# Patient Record
Sex: Male | Born: 1935 | Race: White | Hispanic: No | Marital: Married | Smoking: Former smoker
Health system: Southern US, Community
[De-identification: ages and names within clinical notes are randomized; demographics above are authoritative.]

## PROBLEM LIST (undated history)

## (undated) DIAGNOSIS — S069X9A Unspecified intracranial injury with loss of consciousness of unspecified duration, initial encounter: Secondary | ICD-10-CM

## (undated) DIAGNOSIS — I4819 Other persistent atrial fibrillation: Secondary | ICD-10-CM

## (undated) DIAGNOSIS — H409 Unspecified glaucoma: Secondary | ICD-10-CM

## (undated) DIAGNOSIS — I472 Ventricular tachycardia, unspecified: Secondary | ICD-10-CM

## (undated) DIAGNOSIS — Z950 Presence of cardiac pacemaker: Secondary | ICD-10-CM

## (undated) DIAGNOSIS — I10 Essential (primary) hypertension: Secondary | ICD-10-CM

## (undated) DIAGNOSIS — I509 Heart failure, unspecified: Secondary | ICD-10-CM

## (undated) DIAGNOSIS — N189 Chronic kidney disease, unspecified: Secondary | ICD-10-CM

## (undated) DIAGNOSIS — L723 Sebaceous cyst: Secondary | ICD-10-CM

## (undated) DIAGNOSIS — J449 Chronic obstructive pulmonary disease, unspecified: Secondary | ICD-10-CM

## (undated) DIAGNOSIS — IMO0001 Reserved for inherently not codable concepts without codable children: Secondary | ICD-10-CM

## (undated) DIAGNOSIS — Z9989 Dependence on other enabling machines and devices: Secondary | ICD-10-CM

## (undated) DIAGNOSIS — N289 Disorder of kidney and ureter, unspecified: Secondary | ICD-10-CM

## (undated) DIAGNOSIS — J45909 Unspecified asthma, uncomplicated: Secondary | ICD-10-CM

## (undated) DIAGNOSIS — G4733 Obstructive sleep apnea (adult) (pediatric): Secondary | ICD-10-CM

## (undated) DIAGNOSIS — I499 Cardiac arrhythmia, unspecified: Secondary | ICD-10-CM

## (undated) DIAGNOSIS — T7840XA Allergy, unspecified, initial encounter: Secondary | ICD-10-CM

## (undated) DIAGNOSIS — Z8619 Personal history of other infectious and parasitic diseases: Secondary | ICD-10-CM

## (undated) DIAGNOSIS — H9190 Unspecified hearing loss, unspecified ear: Secondary | ICD-10-CM

## (undated) DIAGNOSIS — R001 Bradycardia, unspecified: Secondary | ICD-10-CM

## (undated) HISTORY — PX: JOINT REPLACEMENT: SHX530

## (undated) HISTORY — DX: Unspecified intracranial injury with loss of consciousness of unspecified duration, initial encounter: S06.9X9A

## (undated) HISTORY — DX: Allergy, unspecified, initial encounter: T78.40XA

## (undated) HISTORY — PX: SHOULDER ACROMIOPLASTY: SHX6093

## (undated) HISTORY — DX: Unspecified hearing loss, unspecified ear: H91.90

## (undated) HISTORY — DX: Ventricular tachycardia: I47.2

## (undated) HISTORY — DX: Essential (primary) hypertension: I10

## (undated) HISTORY — PX: APPENDECTOMY: SHX54

## (undated) HISTORY — DX: Chronic obstructive pulmonary disease, unspecified: J44.9

## (undated) HISTORY — DX: Sebaceous cyst: L72.3

## (undated) HISTORY — PX: INSERT / REPLACE / REMOVE PACEMAKER: SUR710

## (undated) HISTORY — DX: Ventricular tachycardia, unspecified: I47.20

---

## 2003-10-14 HISTORY — PX: TOTAL HIP ARTHROPLASTY: SHX124

## 2005-01-11 DIAGNOSIS — S069XAA Unspecified intracranial injury with loss of consciousness status unknown, initial encounter: Secondary | ICD-10-CM

## 2005-01-11 DIAGNOSIS — S069X9A Unspecified intracranial injury with loss of consciousness of unspecified duration, initial encounter: Secondary | ICD-10-CM

## 2005-01-11 HISTORY — DX: Unspecified intracranial injury with loss of consciousness status unknown, initial encounter: S06.9XAA

## 2005-01-11 HISTORY — DX: Unspecified intracranial injury with loss of consciousness of unspecified duration, initial encounter: S06.9X9A

## 2005-01-26 ENCOUNTER — Emergency Department: Payer: Self-pay | Admitting: Emergency Medicine

## 2005-03-13 HISTORY — PX: CRANIOTOMY: SHX93

## 2005-03-25 ENCOUNTER — Emergency Department: Payer: Self-pay | Admitting: Emergency Medicine

## 2005-03-25 ENCOUNTER — Other Ambulatory Visit: Payer: Self-pay

## 2006-12-16 ENCOUNTER — Ambulatory Visit: Payer: Self-pay | Admitting: Gastroenterology

## 2007-09-07 ENCOUNTER — Ambulatory Visit: Payer: Self-pay | Admitting: Internal Medicine

## 2008-04-19 ENCOUNTER — Ambulatory Visit: Payer: Self-pay | Admitting: General Practice

## 2008-04-19 ENCOUNTER — Other Ambulatory Visit: Payer: Self-pay

## 2008-05-01 ENCOUNTER — Inpatient Hospital Stay: Payer: Self-pay | Admitting: General Practice

## 2008-10-27 ENCOUNTER — Ambulatory Visit: Payer: Self-pay | Admitting: General Practice

## 2008-11-03 ENCOUNTER — Inpatient Hospital Stay: Payer: Self-pay | Admitting: General Practice

## 2009-03-24 IMAGING — CR DG SHOULDER 1V*R*
1 series · 1 of 1 positions shown · non-contrast
Comparison: none

REASON FOR EXAM: postop (2 views please)
COMMENTS:   Bedside (portable):Y

PROCEDURE:     DXR - DXR SHOULDER RIGHT ONE VIEW  - May 01, 2008  [DATE]
RESULT:     The patient is status post RIGHT shoulder hemiarthroplasty.
Surgical drains and skin staples are present. There is no evidence of bone
or hardware complication.

[view not recorded]
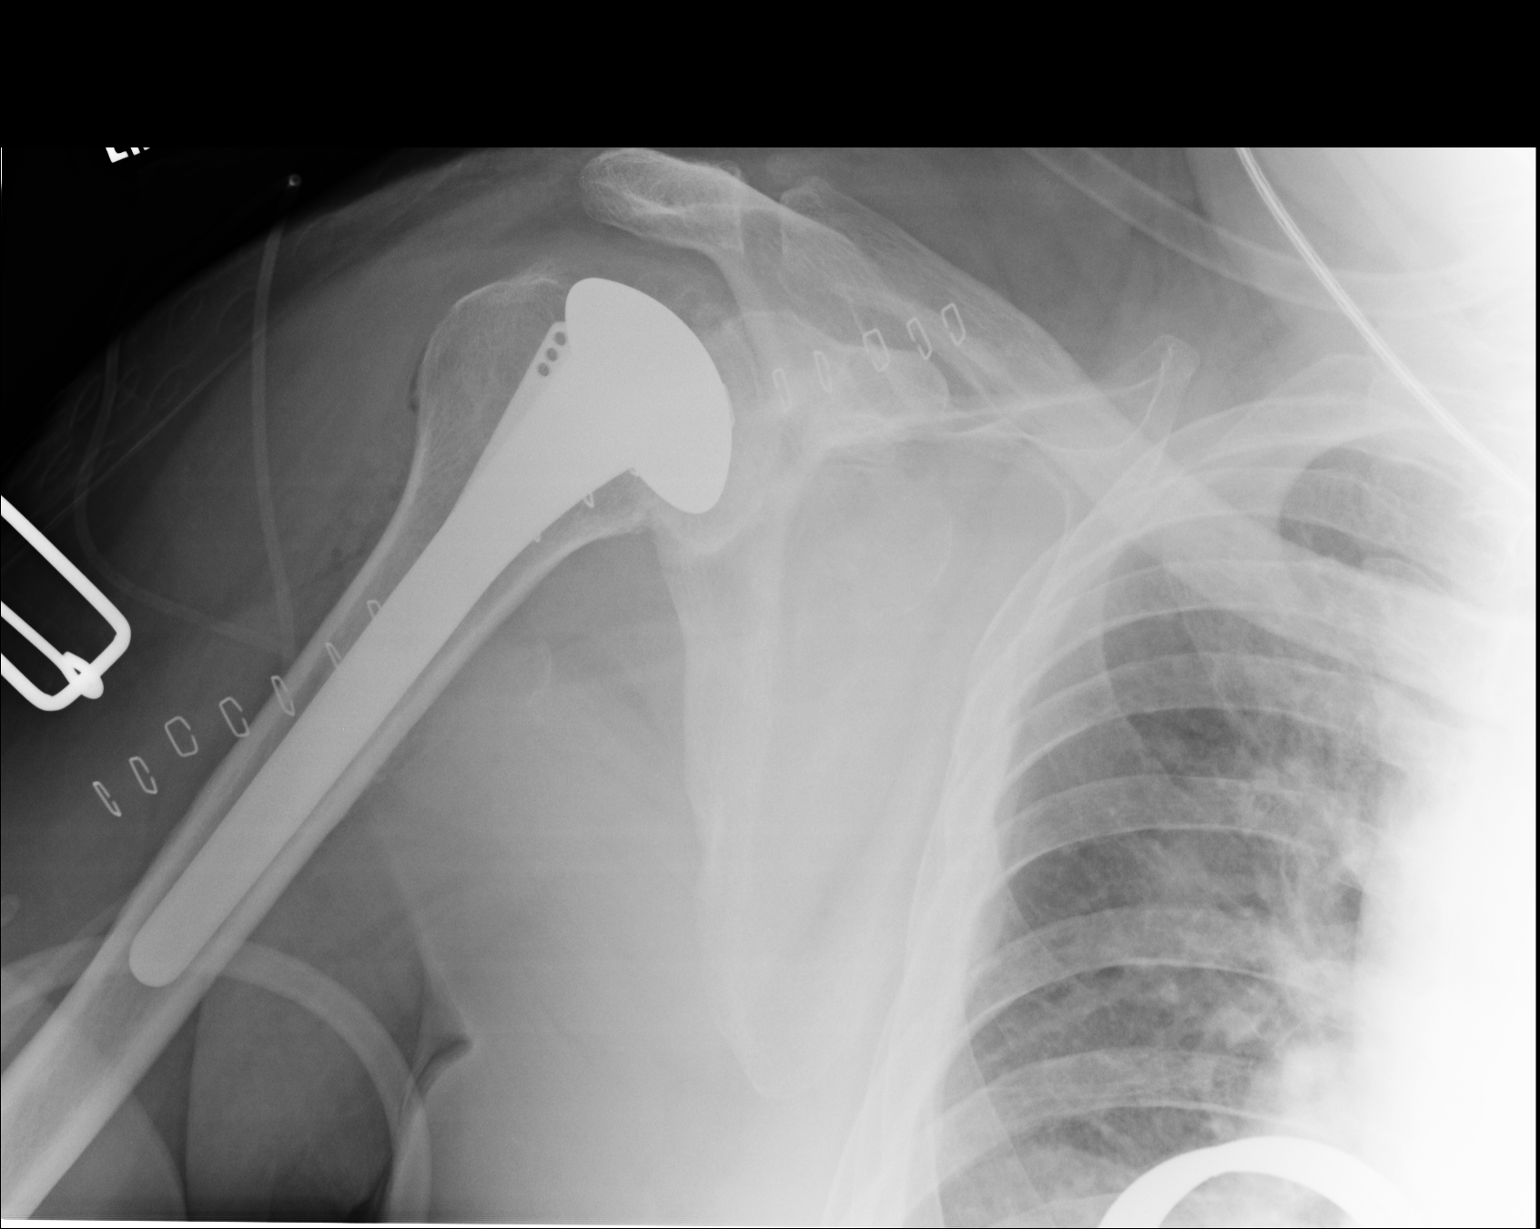

[1 of 1 positions shown; findings below may reference images not displayed]

IMPRESSION: Please see above.

## 2009-10-13 HISTORY — PX: PACEMAKER INSERTION: SHX728

## 2009-12-27 ENCOUNTER — Inpatient Hospital Stay: Payer: Self-pay | Admitting: Internal Medicine

## 2011-12-30 ENCOUNTER — Emergency Department: Payer: Self-pay | Admitting: Emergency Medicine

## 2011-12-30 LAB — TROPONIN I: Troponin-I: 0.02 ng/mL

## 2011-12-30 LAB — BASIC METABOLIC PANEL
Anion Gap: 12 (ref 7–16)
BUN: 25 mg/dL — ABNORMAL HIGH (ref 7–18)
Calcium, Total: 9.9 mg/dL (ref 8.5–10.1)
Co2: 25 mmol/L (ref 21–32)
EGFR (African American): >60
Glucose: 119 mg/dL — ABNORMAL HIGH (ref 65–99)
Osmolality: 287 (ref 275–301)
Sodium: 141 mmol/L (ref 136–145)

## 2011-12-30 LAB — CBC
HCT: 41.5 % (ref 40.0–52.0)
HGB: 14 g/dL (ref 13.0–18.0)
MCHC: 33.7 g/dL (ref 32.0–36.0)
RBC: 4.31 10*6/uL — ABNORMAL LOW (ref 4.40–5.90)
RDW: 13.2 % (ref 11.5–14.5)
WBC: 5.8 10*3/uL (ref 3.8–10.6)

## 2012-01-04 ENCOUNTER — Emergency Department: Payer: Self-pay | Admitting: Unknown Physician Specialty

## 2012-01-04 LAB — COMPREHENSIVE METABOLIC PANEL
Albumin: 3.5 g/dL (ref 3.4–5.0)
Anion Gap: 11 (ref 7–16)
BUN: 21 mg/dL — ABNORMAL HIGH (ref 7–18)
Bilirubin,Total: 0.5 mg/dL (ref 0.2–1.0)
Creatinine: 0.86 mg/dL (ref 0.60–1.30)
EGFR (African American): >60
Glucose: 95 mg/dL (ref 65–99)
SGPT (ALT): 35 U/L
Total Protein: 7.3 g/dL (ref 6.4–8.2)

## 2012-01-04 LAB — CBC
HCT: 41.2 % (ref 40.0–52.0)
HGB: 13.1 g/dL (ref 13.0–18.0)
MCH: 30.8 pg (ref 26.0–34.0)
Platelet: 230 10*3/uL (ref 150–440)
RDW: 12.9 % (ref 11.5–14.5)
WBC: 5.8 10*3/uL (ref 3.8–10.6)

## 2013-03-09 ENCOUNTER — Ambulatory Visit: Payer: Self-pay | Admitting: Ophthalmology

## 2013-03-27 ENCOUNTER — Emergency Department: Payer: Self-pay | Admitting: Emergency Medicine

## 2013-09-05 ENCOUNTER — Emergency Department: Payer: Self-pay | Admitting: Emergency Medicine

## 2013-09-05 LAB — BASIC METABOLIC PANEL
Anion Gap: 6 — ABNORMAL LOW (ref 7–16)
BUN: 20 mg/dL — ABNORMAL HIGH (ref 7–18)
Calcium, Total: 9.5 mg/dL (ref 8.5–10.1)
Chloride: 108 mmol/L — ABNORMAL HIGH (ref 98–107)
Co2: 24 mmol/L (ref 21–32)
Creatinine: 1.06 mg/dL (ref 0.60–1.30)
EGFR (African American): >60
EGFR (Non-African Amer.): >60
Glucose: 172 mg/dL — ABNORMAL HIGH (ref 65–99)
Osmolality: 282 (ref 275–301)
Potassium: 3.9 mmol/L (ref 3.5–5.1)
Sodium: 138 mmol/L (ref 136–145)

## 2013-09-05 LAB — CBC
HCT: 39.2 % — ABNORMAL LOW (ref 40.0–52.0)
HGB: 13 g/dL (ref 13.0–18.0)
MCH: 32.1 pg (ref 26.0–34.0)
MCHC: 33.3 g/dL (ref 32.0–36.0)
RBC: 4.06 10*6/uL — ABNORMAL LOW (ref 4.40–5.90)

## 2014-03-14 DIAGNOSIS — E782 Mixed hyperlipidemia: Secondary | ICD-10-CM | POA: Insufficient documentation

## 2014-03-14 DIAGNOSIS — I4819 Other persistent atrial fibrillation: Secondary | ICD-10-CM | POA: Insufficient documentation

## 2014-03-14 DIAGNOSIS — R001 Bradycardia, unspecified: Secondary | ICD-10-CM | POA: Insufficient documentation

## 2014-04-28 DIAGNOSIS — R079 Chest pain, unspecified: Secondary | ICD-10-CM | POA: Insufficient documentation

## 2014-08-28 DIAGNOSIS — G4733 Obstructive sleep apnea (adult) (pediatric): Secondary | ICD-10-CM | POA: Insufficient documentation

## 2014-08-28 DIAGNOSIS — I340 Nonrheumatic mitral (valve) insufficiency: Secondary | ICD-10-CM | POA: Insufficient documentation

## 2014-08-28 DIAGNOSIS — I5022 Chronic systolic (congestive) heart failure: Secondary | ICD-10-CM | POA: Insufficient documentation

## 2015-01-31 DIAGNOSIS — I10 Essential (primary) hypertension: Secondary | ICD-10-CM | POA: Insufficient documentation

## 2015-05-16 ENCOUNTER — Ambulatory Visit (INDEPENDENT_AMBULATORY_CARE_PROVIDER_SITE_OTHER): Payer: Medicare Other | Admitting: Surgery

## 2015-05-16 ENCOUNTER — Encounter: Payer: Self-pay | Admitting: Surgery

## 2015-05-16 VITALS — BP 135/78 | HR 84 | Temp 98.3°F | Ht 70.0 in | Wt 221.0 lb

## 2015-05-16 DIAGNOSIS — L723 Sebaceous cyst: Secondary | ICD-10-CM

## 2015-05-16 MED ORDER — CLINDAMYCIN HCL 300 MG PO CAPS: 600.0000 mg | ORAL_CAPSULE | Freq: Three times a day (TID) | ORAL | Status: DC

## 2015-05-16 NOTE — Progress Notes (Signed)
CC: Midback cyst  HPI: Douglas Washington is a pleasant 81 yo M who presents with recurrently enlarging mid back cyst.  Usually is small but has intermittent episodes where it gets larger, painful.  Has never drained.  Has never been on antibiotics.  No fever/chills, night sweats, shortness of breath, cough, chest pain, abdominal pain, nausea/vomiting, diarrhea/constipation, dysuria/hematuria.  Active Ambulatory Problems    Diagnosis Date Noted  . Sebaceous cyst 05/16/2015   Resolved Ambulatory Problems    Diagnosis Date Noted  . No Resolved Ambulatory Problems   Past Medical History  Diagnosis Date  . COPD (chronic obstructive pulmonary disease)   . Hypertension   . Atrial fibrillation   . Allergy   . Pacemaker   . Hearing loss   . Traumatic brain injury 01/2005   Past Surgical History  Procedure Laterality Date  . Craniotomy  03/2005  . Shoulder acromioplasty Bilateral   . Total hip arthroplasty Left 2005     Medication List       This list is accurate as of: 05/16/15 11:06 AM.  Always use your most recent med list.               albuterol 108 (90 BASE) MCG/ACT inhaler  Commonly known as:  PROVENTIL HFA;VENTOLIN HFA  Inhale 2 puffs into the lungs every 6 (six) hours as needed for wheezing or shortness of breath.     ALEVE PO  Take 2 capsules by mouth 2 (two) times daily.     aspirin 81 MG tablet  Take 81 mg by mouth daily.     brimonidine 0.2 % ophthalmic solution  Commonly known as:  ALPHAGAN  Place 1 drop into the left eye 2 (two) times daily.     cetirizine 10 MG tablet  Commonly known as:  ZYRTEC  Take 10 mg by mouth daily.     clindamycin 300 MG capsule  Commonly known as:  CLEOCIN  Take 2 capsules (600 mg total) by mouth 3 (three) times daily.     dorzolamide 2 % ophthalmic solution  Commonly known as:  TRUSOPT  Place 1 drop into both eyes 2 (two) times daily.     erythromycin ophthalmic ointment  Place 1 application into the left eye every other day.     glucosamine-chondroitin 500-400 MG tablet  Take 1 tablet by mouth daily.     latanoprost 0.005 % ophthalmic solution  Commonly known as:  XALATAN  Place 1 drop into both eyes at bedtime.     lisinopril 5 MG tablet  Commonly known as:  PRINIVIL,ZESTRIL  Take 5 mg by mouth daily.     loratadine 10 MG tablet  Commonly known as:  CLARITIN  Take 10 mg by mouth daily.     MEGARED OMEGA-3 KRILL OIL 500 MG Caps  Take 1 tablet by mouth daily.     montelukast 10 MG tablet  Commonly known as:  SINGULAIR  Take 10 mg by mouth at bedtime.     multivitamin with minerals tablet  Take 1 tablet by mouth daily.     OVER THE COUNTER MEDICATION  Take 2 capsules by mouth daily. Peenuts (for prostate health)     prednisoLONE acetate 1 % ophthalmic suspension  Commonly known as:  PRED FORTE  Place 1 drop into the left eye every 6 (six) hours.     PROBIOTIC MATURE ADULT PO  Take 1 capsule by mouth daily.     saw palmetto 500 MG capsule  Take 500  mg by mouth daily.     timolol 0.5 % ophthalmic solution  Commonly known as:  TIMOPTIC  Place 1 drop into the left eye 2 (two) times daily.     VALTREX 1000 MG tablet  Generic drug:  valACYclovir  Take 1,000 mg by mouth daily.       History   Social History  . Marital Status: Married    Spouse Name: N/A  . Number of Children: N/A  . Years of Education: N/A   Occupational History  . Not on file.   Social History Main Topics  . Smoking status: Former Smoker    Quit date: 04/14/1974  . Smokeless tobacco: Former Neurosurgeon    Quit date: 04/14/1974  . Alcohol Use: No  . Drug Use: No  . Sexual Activity: Not on file   Other Topics Concern  . Not on file   Social History Narrative   Family History  Problem Relation Age of Onset  . Diabetes Mother   . Heart disease Father   . Heart disease Brother    Allergies  Allergen Reactions  . Ciprofloxacin     Other reaction(s): Unknown  . Oxycodone Other (See Comments)    Hallucinations  .  Penicillins Swelling  . Simvastatin Other (See Comments)    Weakness  . Sulfa Antibiotics Swelling   Blood pressure 135/78, pulse 84, temperature 98.3 F (36.8 C), temperature source Oral, height  (1.778 m), weight 100.245 kg (221 lb). GEN: NAD/A&Ox3 FACE: no obvious facial trauma, normal external nose, normal external ears EYES: no scleral icterus, no conjunctivitis HEAD: normocephalic atraumatic CV: RRR, no MRG RESP: moving air well, lungs clear BACK: 3 x 3 cm nodule, nontender, some inferior erythema ABD: soft, nontender, nondistended EXT: moving all ext well, strength 5/5  Labs: No new labs Imaging: No new imaging  A/P 81 yo admit with infected sebaceous cyst.  Not draining, min tender.  Have offered excision but would like to treat with abx x 1 week to downsize cyst, decrease risk of wound dehiscence.  Have rx for clindamycin and will return in 1 week for excision.   NEURO: cnII-XII grossly intact, sensation intact all 4 ext

## 2015-05-16 NOTE — Patient Instructions (Signed)
You have been given a prescription for an antibiotic today. Please finish this course in it's entirety. Call me if there is any problems once you have started taking this medication.  We will then schedule you for a Procedure in the [place] office to remove this cyst.

## 2015-05-22 ENCOUNTER — Telehealth: Payer: Self-pay | Admitting: Surgery

## 2015-05-22 NOTE — Telephone Encounter (Signed)
Spoke with patient's wife at this time. She states that patient only has 6 pills left of antibiotic but that she can see puss under the surface of the skin. Denies drainage. Redness is not as bad and area has decreased in size but still feels taught. Explained that antibiotics were given to decrease redness and size of overall cyst so that excision would be easier but that antibiotics were not given to make the cyst drain.   Encouraged to continue with antibiotics until they are complete and we will see patient at 8/16 scheduled appointment.

## 2015-05-22 NOTE — Telephone Encounter (Signed)
Please call patients wife, Beorn Portman - patient has almost completed all of his antibiotics for sebaceous cyst. She is concerned it may be flaring up again and wants to know if he should have more antibiotics and/or should he come in to the office to have the cyst looked at before he comes in on 8/16 to have it removed with Dr Juliann Pulse. Please call and advise.

## 2015-05-29 ENCOUNTER — Encounter: Payer: Self-pay | Admitting: Surgery

## 2015-05-29 ENCOUNTER — Ambulatory Visit (INDEPENDENT_AMBULATORY_CARE_PROVIDER_SITE_OTHER): Payer: Medicare Other | Admitting: Surgery

## 2015-05-29 VITALS — BP 138/79 | HR 72 | Temp 98.2°F | Ht 70.0 in | Wt 223.0 lb

## 2015-05-29 DIAGNOSIS — L723 Sebaceous cyst: Secondary | ICD-10-CM | POA: Diagnosis not present

## 2015-05-29 DIAGNOSIS — L089 Local infection of the skin and subcutaneous tissue, unspecified: Secondary | ICD-10-CM

## 2015-05-29 MED ORDER — CLINDAMYCIN HCL 300 MG PO CAPS: 600.0000 mg | ORAL_CAPSULE | Freq: Three times a day (TID) | ORAL | Status: DC

## 2015-05-29 NOTE — Patient Instructions (Signed)
Call or return to ER if you develop fever greater than 101.5, nausea/vomiting, increased pain, redness/drainage from incisions Take bandages off in 48 hours.  Okay to shower with bandages on or after they come off, no tub baths

## 2015-05-29 NOTE — Progress Notes (Signed)
CC: Back pain, mass, redness  HPI: 81 yo with history of infected sebaceous cyst on antibiotics who presents for sebaceous cyst removal.  He has noted worsening pain, redness and drainage from nodule on back.  Has completed antibiotics.  No fevers/chills, night sweats shortness of breath, cough, chest pain, shortness of breath, cough, abdominal pain, nausea/vomiting, diarrhea/constipation, dysuria/hematuria.  Blood pressure 138/79, pulse 72, temperature 98.2 F (36.8 C), temperature source Oral, height 5\' 10"  (1.778 m), weight 223 lb (101.152 kg). GEN: NAD/A&Ox3 BACK: Approx 3 x 3 cm nodule with erythema/induration/fluctuance.  Multiple punctate drainage points.  Procedure performed:   Informed consent was obtained.  Back was prepped and draped.  Timeout was performed.  Lesion infiltrated with 1% lidocaine with epinephrine.  Lesion incised longitudinally.  Small ellipse of skin excised.  Loculations broken up with hemostat.  Wound packed with 1/4 inch iodoform gauze.  Dressed with sterile dressing.  No acute issues.  Needle, sponge and instrument count correct at end of procedure.    Patient restarted on 7 days of clindamycin.  To return in 2 days for packing removal.

## 2015-05-31 ENCOUNTER — Ambulatory Visit (INDEPENDENT_AMBULATORY_CARE_PROVIDER_SITE_OTHER): Payer: Medicare Other | Admitting: Surgery

## 2015-05-31 ENCOUNTER — Encounter: Payer: Self-pay | Admitting: Surgery

## 2015-05-31 VITALS — BP 126/82 | HR 74 | Temp 98.1°F | Ht 70.0 in | Wt 223.0 lb

## 2015-05-31 DIAGNOSIS — Z09 Encounter for follow-up examination after completed treatment for conditions other than malignant neoplasm: Secondary | ICD-10-CM

## 2015-05-31 NOTE — Patient Instructions (Signed)
We have not repacked your cyst today.   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  We will see you back on 06/11/15 at 3pm in the [place] office with Dr. Juliann Pulse per his request.

## 2015-05-31 NOTE — Progress Notes (Signed)
Surgery Progress Note  S: No acute issues O:Blood pressure 126/82, pulse 74, temperature 98.1 F (36.7 C), temperature source Oral, height 5\' 10"  (1.778 m), weight 223 lb (101.152 kg). GEN: NAD/A&Ox3 BACK: incision c/d/i,open, induration and erythema much improved  A/P 81 yo s/p I and D of infected sebaceous cyst, doing well - f/u in 1 week to eval for healing and for possible excision

## 2015-06-11 ENCOUNTER — Encounter: Payer: Self-pay | Admitting: Surgery

## 2015-06-11 ENCOUNTER — Ambulatory Visit (INDEPENDENT_AMBULATORY_CARE_PROVIDER_SITE_OTHER): Payer: Medicare Other | Admitting: Surgery

## 2015-06-11 VITALS — BP 156/90 | HR 86 | Temp 97.9°F | Ht 70.0 in | Wt 221.4 lb

## 2015-06-11 DIAGNOSIS — L723 Sebaceous cyst: Secondary | ICD-10-CM

## 2015-06-11 MED ORDER — CLINDAMYCIN HCL 300 MG PO CAPS: 600.0000 mg | ORAL_CAPSULE | Freq: Three times a day (TID) | ORAL | Status: DC

## 2015-06-11 NOTE — Patient Instructions (Signed)
Follow-up in 1 week in office with Dr. Juliann Pulse.  We have sent your Clindamycin to the Pharmacy. Please take this for three times daily until this is gone.

## 2015-06-11 NOTE — Progress Notes (Signed)
Surgery Office Note  S: Doing well.  Some purulent drainage from prior I and D.  No redness/swelling/pain Blood pressure 156/90, pulse 86, temperature 97.9 F (36.6 C), temperature source Oral, height 5\' 10"  (1.778 m), weight 221 lb 6.4 oz (100.426 kg). GEN: NAD/A&Ox3 BACK: Approx 2 x 2 cm lesion with incision from I and D with some purulence  A/P 81 yo s/p I and D of infected sebaceous cyst.  Would like it removed.  I have explained that although it is healing it should not be excised for risk of wound infection and dehiscence.  Will treat with 1 more course of abx to clean up infection then see in office next week for possible excision.

## 2015-06-20 ENCOUNTER — Encounter: Payer: Self-pay | Admitting: Surgery

## 2015-06-20 ENCOUNTER — Ambulatory Visit (INDEPENDENT_AMBULATORY_CARE_PROVIDER_SITE_OTHER): Payer: Medicare Other | Admitting: Surgery

## 2015-06-20 VITALS — BP 133/83 | HR 93 | Temp 97.9°F | Ht 70.0 in | Wt 221.0 lb

## 2015-06-20 DIAGNOSIS — L723 Sebaceous cyst: Secondary | ICD-10-CM

## 2015-06-20 NOTE — Patient Instructions (Addendum)
Call or return to ER if you develop fever greater than 101.5, nausea/vomiting, increased pain, redness/drainage from incisions Take bandages off in 48 hours.  Okay to shower with bandages on or after they come off, no tub baths. We will see you in two weeks to remove your sutures.

## 2015-06-20 NOTE — Progress Notes (Signed)
Surgery Clinic Note  S: Here to have sebaceous cyst removed. O:Blood pressure 133/83, pulse 93, temperature 97.9 F (36.6 C), temperature source Oral, height 5\' 10"  (1.778 m), weight 221 lb (100.245 kg). GEN: NAD/A&Ox3 BACK: Healed sebaceous cyst s/p I and D, approx 2 x 1.5 cm  Sebaceous cyst removed as follows:   Informed consent obtained.  Back prepped and draped.  Timeout performed.  Lengthwise small ellipse made, deepened to cyst.  Cyst excised circumferentially.  Irrigated, no bleeding noted.  Wound closed with interrupted 3-0 nylon vertical mattress sutures.  Dressing placed.  Drapes taken down.  No immediate complications.  Needle, sponge and instrument count correct at end of procedure.    Offered PO pain meds but patient refused.  F/u in 2 weeks.

## 2015-06-27 ENCOUNTER — Encounter (INDEPENDENT_AMBULATORY_CARE_PROVIDER_SITE_OTHER): Payer: Self-pay

## 2015-06-27 ENCOUNTER — Telehealth: Payer: Self-pay

## 2015-06-27 ENCOUNTER — Ambulatory Visit (INDEPENDENT_AMBULATORY_CARE_PROVIDER_SITE_OTHER): Payer: Medicare Other | Admitting: Surgery

## 2015-06-27 VITALS — BP 138/85 | HR 97 | Temp 97.5°F | Ht 70.0 in | Wt 224.0 lb

## 2015-06-27 DIAGNOSIS — L723 Sebaceous cyst: Secondary | ICD-10-CM

## 2015-06-27 NOTE — Patient Instructions (Signed)
Please give us a call if you have any further concerns. 

## 2015-06-27 NOTE — Telephone Encounter (Signed)
Patient was here for a sebaceous cyst removal on 06/20/2015 by Dr. Juliann Pulse.  Patient's wife called stating that last night her husband had bled from his incision area. I asked if his incision area was inflamed or red or if the stitches had come off, and she stated no. I also asked if he had a fever and she stated no. She stated that the incision area is a bit red but that it had stopped bleeding. However, his wife is worried and wanted a Careers adviser to take a look at her husband. I told her that it would be fine and to come in today at 3:15 PM so Dr. Michela Pitcher could take a look. Patient's wife agreed.

## 2015-06-27 NOTE — Progress Notes (Signed)
Outpatient Surgical Follow Up  06/27/2015  Douglas Washington is an 81 y.o. male.   Chief Complaint  Patient presents with  . Follow-up    Check Incision area    HPI: His wife noted some drainage from the sebaceous cyst site yesterday. It appeared to be bloody drainage. She was concerned that he had "popped" a stitch. She was also concerned about the bleeding and recommended he come to the office.  Past Medical History  Diagnosis Date  . Sebaceous cyst     Upper back  . COPD (chronic obstructive pulmonary disease)   . Hypertension   . Atrial fibrillation   . Allergy   . Pacemaker   . Hearing loss   . Traumatic brain injury 01/2005    Past Surgical History  Procedure Laterality Date  . Craniotomy  03/2005  . Shoulder acromioplasty Bilateral   . Total hip arthroplasty Left 2005    Family History  Problem Relation Age of Onset  . Diabetes Mother   . Heart disease Father   . Heart disease Brother     Social History:  reports that he quit smoking about 41 years ago. He quit smokeless tobacco use about 41 years ago. He reports that he does not drink alcohol or use illicit drugs.  Allergies:  Allergies  Allergen Reactions  . Ciprofloxacin     Other reaction(s): Unknown  . Oxycodone Other (See Comments)    Hallucinations  . Penicillins Swelling  . Simvastatin Other (See Comments)    Weakness  . Sulfa Antibiotics Swelling    Medications reviewed.    ROS    BP 138/85 mmHg  Pulse 97  Temp(Src) 97.5 F (36.4 C) (Oral)  Ht 5\' 10"  (1.778 m)  Wt 101.606 kg (224 lb)  BMI 32.14 kg/m2  Physical Exam There is no obvious bleeding site. I suspect he may have collected a small seroma or hematoma in the site of the excision. Currently does not appear to be any drainage. There is no evidence for infection the sutures all appeared intact.    No results found for this or any previous visit (from the past 48 hour(s)). No results found.  Assessment/Plan:  1. Sebaceous  cyst We reassured them today that there was no ongoing problem. We'll plan to see him back as scheduled for his office appointment and suture removal.     Tiney Rouge III  06/27/2015,negative

## 2015-07-03 ENCOUNTER — Encounter (INDEPENDENT_AMBULATORY_CARE_PROVIDER_SITE_OTHER): Payer: Self-pay

## 2015-07-03 ENCOUNTER — Ambulatory Visit (INDEPENDENT_AMBULATORY_CARE_PROVIDER_SITE_OTHER): Payer: Medicare Other | Admitting: Surgery

## 2015-07-03 ENCOUNTER — Encounter: Payer: Self-pay | Admitting: Surgery

## 2015-07-03 VITALS — BP 138/82 | HR 93 | Temp 97.8°F | Ht 70.0 in | Wt 225.0 lb

## 2015-07-03 DIAGNOSIS — Z09 Encounter for follow-up examination after completed treatment for conditions other than malignant neoplasm: Secondary | ICD-10-CM

## 2015-07-03 NOTE — Patient Instructions (Signed)
You will need a nurse visit in 1 week to remove remainder of your sutures.   Please call with any other questions or concerns.

## 2015-07-03 NOTE — Progress Notes (Signed)
Surgery Progress Note  S: Two episodes of drainage postop, 1 bloody, 1 serous.  Doing well.  No pain, min swelling O:Blood pressure 138/82, pulse 93, temperature 97.8 F (36.6 C), temperature source Oral, height 5\' 10"  (1.778 m), weight 225 lb (102.059 kg). GEN: NAD/A&Ox3 BACK: Healed incision with small bullae at inferior incision, no erythema/induration/drainage  A/P 81 yo s/p excision of epidermoid cyst, doing well - remove top two sutures - keep bottom two for now, f/u in 1 week for nurse visit and removal

## 2015-07-10 ENCOUNTER — Ambulatory Visit (INDEPENDENT_AMBULATORY_CARE_PROVIDER_SITE_OTHER): Payer: Medicare Other

## 2015-07-10 DIAGNOSIS — Z4802 Encounter for removal of sutures: Secondary | ICD-10-CM

## 2015-07-10 NOTE — Patient Instructions (Signed)
Please come back tomorrow 07/11/2015 to see one of our surgeons in the office. Maybe until then we will be able to remove your stitches.

## 2015-07-11 ENCOUNTER — Encounter: Payer: Self-pay | Admitting: General Surgery

## 2015-07-11 ENCOUNTER — Ambulatory Visit (INDEPENDENT_AMBULATORY_CARE_PROVIDER_SITE_OTHER): Payer: Medicare Other | Admitting: General Surgery

## 2015-07-11 VITALS — BP 118/81 | HR 91 | Temp 98.7°F | Ht 70.0 in | Wt 222.0 lb

## 2015-07-11 DIAGNOSIS — L723 Sebaceous cyst: Secondary | ICD-10-CM

## 2015-07-11 NOTE — Patient Instructions (Signed)
Please give [place] a call if you have any questions or concerns. Let us know if the incision area gets red or if he gets a fever higher than 101.5. 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

## 2015-07-11 NOTE — Progress Notes (Signed)
Patient feels and looks good. His incision area has some erythema but not warm at touch. However, there is some pus coming out. Therefore, I will not remove his sutures and scheduled an appointment to see Dr. Tonita Cong the next day (07/11/2015).

## 2015-07-11 NOTE — Progress Notes (Signed)
Outpatient Surgical Follow Up  07/11/2015  Douglas Washington is an 81 y.o. male.   Chief Complaint  Patient presents with  . Routine Post Op    Sebaceous Cyst Removal    HPI: Patient is returning to clinic for evaluation and suture removal. He has a report having a sebaceous cyst removed from his upper mid back approximately 10 days ago. He has completed a course of antibiotics as provided by the surgeon. He's had some straw-colored drainage from the incision site that has been gradually decreasing. Denies any fevers, chills, nausea, vomiting. Otherwise doing quite well. Wife is concerned areas still infected.  Past Medical History  Diagnosis Date  . Sebaceous cyst     Upper back  . COPD (chronic obstructive pulmonary disease)   . Hypertension   . Atrial fibrillation   . Allergy   . Pacemaker   . Hearing loss   . Traumatic brain injury 01/2005    Past Surgical History  Procedure Laterality Date  . Craniotomy  03/2005  . Shoulder acromioplasty Bilateral   . Total hip arthroplasty Left 2005    Family History  Problem Relation Age of Onset  . Diabetes Mother   . Heart disease Father   . Heart disease Brother     Social History:  reports that he quit smoking about 41 years ago. He quit smokeless tobacco use about 41 years ago. He reports that he does not drink alcohol or use illicit drugs.  Allergies:  Allergies  Allergen Reactions  . Ciprofloxacin     Other reaction(s): Unknown  . Oxycodone Other (See Comments)    Hallucinations  . Penicillins Swelling  . Simvastatin Other (See Comments)    Weakness  . Sulfa Antibiotics Swelling    Medications reviewed.    ROS multipoint review of systems was completed. All pertinent positives within the history of present illness remainder were negative.    BP 118/81 mmHg  Pulse 91  Temp(Src) 98.7 F (37.1 C) (Oral)  Ht 5\' 10"  (1.778 m)  Wt 100.699 kg (222 lb)  BMI 31.85 kg/m2  Physical Exam Gen.: No acute distress   Chest: Clear to auscultation and regular rhythm Abdomen: Soft, nontender, nondistended Skin: Sutures in place with approximate 2 cm prior cyst incision site. There are some hyperemia around this however no expressible drainage. No evidence of current induration or fluctuance.    No results found for this or any previous visit (from the past 48 hour(s)). No results found.  Assessment/Plan:  1. Sebaceous cyst 81 year old male 10 days status post excision of sebaceous cyst. Sutures removed in clinic today. Discussed with patient and his wife that should the drainage not stop by Monday they're to call clinic or should the redness around the wound spread incentive retreat there are return to clinic. Was follow-up when necessary.     Ricarda Frame  07/11/2015,

## 2015-07-12 ENCOUNTER — Ambulatory Visit: Payer: Medicare Other | Admitting: General Surgery

## 2016-04-08 ENCOUNTER — Encounter: Payer: Self-pay | Admitting: Surgery

## 2016-06-05 ENCOUNTER — Emergency Department (HOSPITAL_COMMUNITY): Payer: Medicare Other

## 2016-06-05 ENCOUNTER — Encounter (HOSPITAL_COMMUNITY): Payer: Self-pay

## 2016-06-05 ENCOUNTER — Inpatient Hospital Stay (HOSPITAL_COMMUNITY)
Admission: EM | Admit: 2016-06-05 | Discharge: 2016-06-09 | DRG: 280 | Disposition: A | Discharge status: Home / Self Care | Payer: Medicare Other | Attending: Cardiology | Admitting: Cardiology

## 2016-06-05 DIAGNOSIS — H9190 Unspecified hearing loss, unspecified ear: Secondary | ICD-10-CM | POA: Diagnosis present

## 2016-06-05 DIAGNOSIS — I481 Persistent atrial fibrillation: Secondary | ICD-10-CM | POA: Diagnosis present

## 2016-06-05 DIAGNOSIS — N132 Hydronephrosis with renal and ureteral calculous obstruction: Secondary | ICD-10-CM | POA: Diagnosis present

## 2016-06-05 DIAGNOSIS — Z950 Presence of cardiac pacemaker: Secondary | ICD-10-CM | POA: Diagnosis not present

## 2016-06-05 DIAGNOSIS — I428 Other cardiomyopathies: Secondary | ICD-10-CM | POA: Diagnosis present

## 2016-06-05 DIAGNOSIS — J449 Chronic obstructive pulmonary disease, unspecified: Secondary | ICD-10-CM | POA: Diagnosis present

## 2016-06-05 DIAGNOSIS — I493 Ventricular premature depolarization: Secondary | ICD-10-CM | POA: Diagnosis present

## 2016-06-05 DIAGNOSIS — G4733 Obstructive sleep apnea (adult) (pediatric): Secondary | ICD-10-CM | POA: Diagnosis present

## 2016-06-05 DIAGNOSIS — Z7982 Long term (current) use of aspirin: Secondary | ICD-10-CM | POA: Diagnosis not present

## 2016-06-05 DIAGNOSIS — R55 Syncope and collapse: Secondary | ICD-10-CM | POA: Diagnosis present

## 2016-06-05 DIAGNOSIS — Z79899 Other long term (current) drug therapy: Secondary | ICD-10-CM

## 2016-06-05 DIAGNOSIS — Z96642 Presence of left artificial hip joint: Secondary | ICD-10-CM | POA: Diagnosis present

## 2016-06-05 DIAGNOSIS — I459 Conduction disorder, unspecified: Secondary | ICD-10-CM | POA: Diagnosis not present

## 2016-06-05 DIAGNOSIS — D649 Anemia, unspecified: Secondary | ICD-10-CM | POA: Diagnosis present

## 2016-06-05 DIAGNOSIS — I509 Heart failure, unspecified: Secondary | ICD-10-CM | POA: Diagnosis not present

## 2016-06-05 DIAGNOSIS — I472 Ventricular tachycardia, unspecified: Secondary | ICD-10-CM

## 2016-06-05 DIAGNOSIS — I110 Hypertensive heart disease with heart failure: Secondary | ICD-10-CM | POA: Diagnosis present

## 2016-06-05 DIAGNOSIS — I5043 Acute on chronic combined systolic (congestive) and diastolic (congestive) heart failure: Secondary | ICD-10-CM | POA: Diagnosis not present

## 2016-06-05 DIAGNOSIS — Z87820 Personal history of traumatic brain injury: Secondary | ICD-10-CM

## 2016-06-05 DIAGNOSIS — Z87891 Personal history of nicotine dependence: Secondary | ICD-10-CM | POA: Diagnosis not present

## 2016-06-05 DIAGNOSIS — I4819 Other persistent atrial fibrillation: Secondary | ICD-10-CM

## 2016-06-05 DIAGNOSIS — I2510 Atherosclerotic heart disease of native coronary artery without angina pectoris: Secondary | ICD-10-CM | POA: Diagnosis present

## 2016-06-05 DIAGNOSIS — I214 Non-ST elevation (NSTEMI) myocardial infarction: Secondary | ICD-10-CM | POA: Diagnosis present

## 2016-06-05 DIAGNOSIS — I5022 Chronic systolic (congestive) heart failure: Secondary | ICD-10-CM | POA: Diagnosis present

## 2016-06-05 HISTORY — DX: Other persistent atrial fibrillation: I48.19

## 2016-06-05 HISTORY — DX: Dependence on other enabling machines and devices: Z99.89

## 2016-06-05 HISTORY — DX: Bradycardia, unspecified: R00.1

## 2016-06-05 HISTORY — DX: Obstructive sleep apnea (adult) (pediatric): G47.33

## 2016-06-05 LAB — CBC WITH DIFFERENTIAL/PLATELET
BASOS PCT: 0 %
Basophils Absolute: 0 10*3/uL (ref 0.0–0.1)
EOS PCT: 1 %
Eosinophils Absolute: 0.1 10*3/uL (ref 0.0–0.7)
HEMATOCRIT: 37.9 % — AB (ref 39.0–52.0)
HEMOGLOBIN: 11.5 g/dL — AB (ref 13.0–17.0)
LYMPHS PCT: 24 %
Lymphs Abs: 2 10*3/uL (ref 0.7–4.0)
MCH: 26.5 pg (ref 26.0–34.0)
MCHC: 30.3 g/dL (ref 30.0–36.0)
MCV: 87.3 fL (ref 78.0–100.0)
MONOS PCT: 8 %
Monocytes Absolute: 0.7 10*3/uL (ref 0.1–1.0)
NEUTROS PCT: 67 %
Neutro Abs: 5.7 10*3/uL (ref 1.7–7.7)
Platelets: 275 10*3/uL (ref 150–400)
RBC: 4.34 MIL/uL (ref 4.22–5.81)
RDW: 16.8 % — ABNORMAL HIGH (ref 11.5–15.5)
WBC: 8.5 10*3/uL (ref 4.0–10.5)

## 2016-06-05 LAB — BASIC METABOLIC PANEL
Anion gap: 7 (ref 5–15)
BUN: 24 mg/dL — AB (ref 6–20)
CHLORIDE: 114 mmol/L — AB (ref 101–111)
CO2: 17 mmol/L — AB (ref 22–32)
Calcium: 9.4 mg/dL (ref 8.9–10.3)
Creatinine, Ser: 1.24 mg/dL (ref 0.61–1.24)
GFR calc Af Amer: 59 mL/min — ABNORMAL LOW (ref >60)
GFR calc non Af Amer: 51 mL/min — ABNORMAL LOW (ref >60)
GLUCOSE: 184 mg/dL — AB (ref 65–99)
POTASSIUM: 4.5 mmol/L (ref 3.5–5.1)
SODIUM: 138 mmol/L (ref 135–145)

## 2016-06-05 LAB — I-STAT VENOUS BLOOD GAS, ED
Acid-base deficit: 10 mmol/L — ABNORMAL HIGH (ref 0.0–2.0)
BICARBONATE: 15.9 meq/L — AB (ref 20.0–24.0)
O2 Saturation: 94 %
PH VEN: 7.281 (ref 7.250–7.300)
TCO2: 17 mmol/L (ref 0–100)
pCO2, Ven: 33.7 mmHg — ABNORMAL LOW (ref 45.0–50.0)
pO2, Ven: 77 mmHg — ABNORMAL HIGH (ref 31.0–45.0)

## 2016-06-05 LAB — BRAIN NATRIURETIC PEPTIDE: B NATRIURETIC PEPTIDE 5: 120.5 pg/mL — AB (ref 0.0–100.0)

## 2016-06-05 LAB — PROTIME-INR
INR: 1.23
Prothrombin Time: 15.6 seconds — ABNORMAL HIGH (ref 11.4–15.2)

## 2016-06-05 LAB — I-STAT CHEM 8, ED
BUN: 25 mg/dL — AB (ref 6–20)
CALCIUM ION: 1.27 mmol/L — AB (ref 1.12–1.23)
CHLORIDE: 114 mmol/L — AB (ref 101–111)
Creatinine, Ser: 1.2 mg/dL (ref 0.61–1.24)
Glucose, Bld: 178 mg/dL — ABNORMAL HIGH (ref 65–99)
HCT: 38 % — ABNORMAL LOW (ref 39.0–52.0)
Hemoglobin: 12.9 g/dL — ABNORMAL LOW (ref 13.0–17.0)
Potassium: 4.5 mmol/L (ref 3.5–5.1)
SODIUM: 145 mmol/L (ref 135–145)
TCO2: 18 mmol/L (ref 0–100)

## 2016-06-05 LAB — POCT I-STAT TROPONIN I: Troponin i, poc: 0.05 ng/mL (ref 0.00–0.08)

## 2016-06-05 LAB — TSH: TSH: 1.339 u[IU]/mL (ref 0.350–4.500)

## 2016-06-05 LAB — MAGNESIUM
MAGNESIUM: 2.1 mg/dL (ref 1.7–2.4)
Magnesium: 2.1 mg/dL (ref 1.7–2.4)

## 2016-06-05 LAB — TROPONIN I: TROPONIN I: 4.05 ng/mL — AB (ref <0.03)

## 2016-06-05 MED ORDER — AMIODARONE HCL IN DEXTROSE 360-4.14 MG/200ML-% IV SOLN
60.0000 mg/h | INTRAVENOUS | Status: DC
  Administered 2016-06-05 (×2): 60 mg/h via INTRAVENOUS
  Filled 2016-06-05: qty 200

## 2016-06-05 MED ORDER — BECLOMETHASONE DIPROPIONATE 80 MCG/ACT IN AERS: 1.0000 | INHALATION_SPRAY | Freq: Two times a day (BID) | RESPIRATORY_TRACT | Status: DC

## 2016-06-05 MED ORDER — ERYTHROMYCIN 5 MG/GM OP OINT
1.0000 "application " | TOPICAL_OINTMENT | OPHTHALMIC | Status: DC
  Administered 2016-06-07: 1 via OPHTHALMIC
  Filled 2016-06-05 (×2): qty 3.5

## 2016-06-05 MED ORDER — AMIODARONE HCL IN DEXTROSE 360-4.14 MG/200ML-% IV SOLN
60.0000 mg/h | Freq: Once | INTRAVENOUS | Status: AC
  Administered 2016-06-05: 60 mg/h via INTRAVENOUS

## 2016-06-05 MED ORDER — ALPRAZOLAM 0.25 MG PO TABS: 0.2500 mg | ORAL_TABLET | Freq: Two times a day (BID) | ORAL | Status: DC | PRN

## 2016-06-05 MED ORDER — AMIODARONE HCL IN DEXTROSE 360-4.14 MG/200ML-% IV SOLN
30.0000 mg/h | INTRAVENOUS | Status: DC
Start: 2016-06-06 — End: 2016-06-05

## 2016-06-05 MED ORDER — ETOMIDATE 2 MG/ML IV SOLN
10.0000 mg | Freq: Once | INTRAVENOUS | Status: AC
  Administered 2016-06-05: 10 mg via INTRAVENOUS

## 2016-06-05 MED ORDER — MONTELUKAST SODIUM 10 MG PO TABS
10.0000 mg | ORAL_TABLET | Freq: Every day | ORAL | Status: DC
  Administered 2016-06-05 – 2016-06-08 (×4): 10 mg via ORAL
  Filled 2016-06-05 (×4): qty 1

## 2016-06-05 MED ORDER — AMIODARONE HCL IN DEXTROSE 360-4.14 MG/200ML-% IV SOLN
30.0000 mg/h | INTRAVENOUS | Status: DC
  Administered 2016-06-06: 30 mg/h via INTRAVENOUS
  Filled 2016-06-05: qty 200

## 2016-06-05 MED ORDER — BUDESONIDE 0.25 MG/2ML IN SUSP
0.2500 mg | Freq: Two times a day (BID) | RESPIRATORY_TRACT | Status: DC
  Administered 2016-06-06 – 2016-06-09 (×7): 0.25 mg via RESPIRATORY_TRACT
  Filled 2016-06-05 (×8): qty 2

## 2016-06-05 MED ORDER — PREDNISOLONE ACETATE 1 % OP SUSP
1.0000 [drp] | OPHTHALMIC | Status: DC
  Administered 2016-06-06: 1 [drp] via OPHTHALMIC
  Filled 2016-06-05: qty 1

## 2016-06-05 MED ORDER — ADULT MULTIVITAMIN W/MINERALS CH
1.0000 | ORAL_TABLET | Freq: Every day | ORAL | Status: DC
  Administered 2016-06-06 – 2016-06-09 (×4): 1 via ORAL
  Filled 2016-06-05 (×5): qty 1

## 2016-06-05 MED ORDER — LORATADINE 10 MG PO TABS
10.0000 mg | ORAL_TABLET | Freq: Every day | ORAL | Status: DC
  Administered 2016-06-06 – 2016-06-09 (×4): 10 mg via ORAL
  Filled 2016-06-05 (×5): qty 1

## 2016-06-05 MED ORDER — AMIODARONE HCL IN DEXTROSE 360-4.14 MG/200ML-% IV SOLN: 60.0000 mg/h | Freq: Once | INTRAVENOUS | Status: DC

## 2016-06-05 MED ORDER — SODIUM CHLORIDE 0.9% FLUSH: 3.0000 mL | INTRAVENOUS | Status: DC | PRN

## 2016-06-05 MED ORDER — AMIODARONE LOAD VIA INFUSION: 150.0000 mg | Freq: Once | INTRAVENOUS | Status: DC

## 2016-06-05 MED ORDER — TIMOLOL MALEATE 0.5 % OP SOLN
1.0000 [drp] | Freq: Two times a day (BID) | OPHTHALMIC | Status: DC
  Administered 2016-06-05 – 2016-06-08 (×6): 1 [drp] via OPHTHALMIC
  Filled 2016-06-05 (×2): qty 5

## 2016-06-05 MED ORDER — SODIUM CHLORIDE 0.9 % IV SOLN: 250.0000 mL | INTRAVENOUS | Status: DC | PRN

## 2016-06-05 MED ORDER — ALBUTEROL SULFATE (2.5 MG/3ML) 0.083% IN NEBU: 3.0000 mL | INHALATION_SOLUTION | Freq: Four times a day (QID) | RESPIRATORY_TRACT | Status: DC | PRN

## 2016-06-05 MED ORDER — ZOLPIDEM TARTRATE 5 MG PO TABS: 5.0000 mg | ORAL_TABLET | Freq: Every evening | ORAL | Status: DC | PRN

## 2016-06-05 MED ORDER — BRIMONIDINE TARTRATE 0.2 % OP SOLN
1.0000 [drp] | Freq: Two times a day (BID) | OPHTHALMIC | Status: DC
  Administered 2016-06-05 – 2016-06-08 (×6): 1 [drp] via OPHTHALMIC
  Filled 2016-06-05 (×2): qty 5

## 2016-06-05 MED ORDER — LATANOPROST 0.005 % OP SOLN
1.0000 [drp] | Freq: Every day | OPHTHALMIC | Status: DC
  Administered 2016-06-05 – 2016-06-08 (×3): 1 [drp] via OPHTHALMIC
  Filled 2016-06-05 (×2): qty 2.5

## 2016-06-05 MED ORDER — AMIODARONE HCL IN DEXTROSE 360-4.14 MG/200ML-% IV SOLN: 60.0000 mg/h | INTRAVENOUS | Status: DC

## 2016-06-05 MED ORDER — DORZOLAMIDE HCL 2 % OP SOLN
1.0000 [drp] | Freq: Two times a day (BID) | OPHTHALMIC | Status: DC
  Administered 2016-06-05 – 2016-06-08 (×6): 1 [drp] via OPHTHALMIC
  Filled 2016-06-05 (×2): qty 10

## 2016-06-05 MED ORDER — ALBUTEROL SULFATE HFA 108 (90 BASE) MCG/ACT IN AERS: 2.0000 | INHALATION_SPRAY | Freq: Four times a day (QID) | RESPIRATORY_TRACT | Status: DC | PRN

## 2016-06-05 MED ORDER — HEPARIN (PORCINE) IN NACL 100-0.45 UNIT/ML-% IJ SOLN
1200.0000 [IU]/h | INTRAMUSCULAR | Status: DC
  Administered 2016-06-05: 1300 [IU]/h via INTRAVENOUS
  Administered 2016-06-06: 1200 [IU]/h via INTRAVENOUS
  Filled 2016-06-05 (×2): qty 250

## 2016-06-05 MED ORDER — ONDANSETRON HCL 4 MG/2ML IJ SOLN: 4.0000 mg | Freq: Four times a day (QID) | INTRAMUSCULAR | Status: DC | PRN

## 2016-06-05 MED ORDER — NITROGLYCERIN 0.4 MG SL SUBL: 0.4000 mg | SUBLINGUAL_TABLET | SUBLINGUAL | Status: DC | PRN

## 2016-06-05 MED ORDER — SODIUM CHLORIDE 0.9% FLUSH
3.0000 mL | Freq: Two times a day (BID) | INTRAVENOUS | Status: DC
  Administered 2016-06-05 – 2016-06-09 (×5): 3 mL via INTRAVENOUS

## 2016-06-05 MED ORDER — HEPARIN BOLUS VIA INFUSION
4000.0000 [IU] | Freq: Once | INTRAVENOUS | Status: AC
  Administered 2016-06-05: 4000 [IU] via INTRAVENOUS
  Filled 2016-06-05: qty 4000

## 2016-06-05 MED ORDER — MEGARED OMEGA-3 KRILL OIL 500 MG PO CAPS: 500.0000 mg | ORAL_CAPSULE | Freq: Every day | ORAL | Status: DC

## 2016-06-05 MED ORDER — SODIUM CHLORIDE 0.9 % IV BOLUS (SEPSIS)
1000.0000 mL | Freq: Once | INTRAVENOUS | Status: AC
Start: 2016-06-05 — End: 2016-06-05
  Administered 2016-06-05: 1000 mL via INTRAVENOUS

## 2016-06-05 MED ORDER — GLUCOSAMINE-CHONDROITIN 500-400 MG PO TABS: 1.0000 | ORAL_TABLET | Freq: Every day | ORAL | Status: DC

## 2016-06-05 MED ORDER — VALACYCLOVIR HCL 500 MG PO TABS
500.0000 mg | ORAL_TABLET | Freq: Every day | ORAL | Status: DC
  Administered 2016-06-06 – 2016-06-09 (×4): 500 mg via ORAL
  Filled 2016-06-05 (×4): qty 1

## 2016-06-05 MED ORDER — ASPIRIN 81 MG PO CHEW
81.0000 mg | CHEWABLE_TABLET | Freq: Every day | ORAL | Status: DC
  Administered 2016-06-06 – 2016-06-07 (×2): 81 mg via ORAL
  Filled 2016-06-05 (×2): qty 1

## 2016-06-05 MED ORDER — ACETAMINOPHEN 325 MG PO TABS: 650.0000 mg | ORAL_TABLET | ORAL | Status: DC | PRN

## 2016-06-05 NOTE — H&P (Signed)
CARDIOLOGY HISTORY AND PHYSICAL   Patient ID: Douglas Washington MRN: [id number] DOB/AGE: 1928-12-05 82 y.o.  Admit date: 06/05/2016  Primary Physician   BABAOFF, Lavada Mesi, MD Primary Cardiologist   Dr Gwen Pounds Reason for Consultation   Syncope and arrhythmia Requesting MD: Dr Adela Lank  HPI:Douglas Washington is a 83 y.o. year old male with a history of NICM w/ EF 45%, PAF (refused anticoag), COPD, OSA on CPAP, HTN, prev SDH after trauma.  He may have had some extra congestion, but no edema. Chronic DOE, not much changed. No chest pain or palpitations. Doing well when seen by Dr Gwen Pounds.  Today, pt was in USOH and was standing still. He had been up around 5 minutes. He had a syncopal episode and woke up on the ground but could not get up. His wife heard him fall, but he was awake when she got to him.  He was sweating profusely, no chest pain, no palpitations, no SOB.  EMS was called and pt very tachycardic. IV NS 500 cc and Adenosine x 2 given without improvement en route to ER.   Upon arrival to the ER, his ECG shows a WCT with HR 227. He was unstable and hypotensive, and required DCCV x 1, successful. When in SR, he was having PVCs.    Past Medical History:  Diagnosis Date  . Allergy   . Atrial fibrillation (HCC)   . COPD (chronic obstructive pulmonary disease) (HCC)   . Hearing loss   . Hypertension   . Pacemaker   . Sebaceous cyst    Upper back  . Traumatic brain injury [facility]) 01/2005     Past Surgical History:  Procedure Laterality Date  . CRANIOTOMY  03/2005  . SHOULDER ACROMIOPLASTY Bilateral   . TOTAL HIP ARTHROPLASTY Left 2005    Allergies  Allergen Reactions  . Ciprofloxacin     Other reaction(s): Unknown  . Oxycodone Other (See Comments)    Hallucinations  . Penicillins Swelling  . Simvastatin Other (See Comments)    Weakness  . Sulfa Antibiotics Swelling    I have reviewed the patient's current medications Prior to Admission medications   Medication Sig Start  Date End Date Taking? Authorizing Provider  albuterol (PROVENTIL HFA;VENTOLIN HFA) 108 (90 BASE) MCG/ACT inhaler Inhale 2 puffs into the lungs every 6 (six) hours as needed for wheezing or shortness of breath.    Historical Provider, MD  aspirin 81 MG tablet Take 81 mg by mouth daily.    Historical Provider, MD  brimonidine (ALPHAGAN) 0.2 % ophthalmic solution Place 1 drop into the left eye 2 (two) times daily. 04/27/15   Historical Provider, MD  cetirizine (ZYRTEC) 10 MG tablet Take 10 mg by mouth daily.    Historical Provider, MD  dorzolamide (TRUSOPT) 2 % ophthalmic solution Place 1 drop into both eyes 2 (two) times daily. 04/30/15   Historical Provider, MD  erythromycin ophthalmic ointment Place 1 application into the left eye every other day. 04/03/15   Historical Provider, MD  fluticasone (FLONASE) 50 MCG/ACT nasal spray Place 2 sprays into both nostrils daily. 06/03/15   Historical Provider, MD  glucosamine-chondroitin 500-400 MG tablet Take 1 tablet by mouth daily.     Historical Provider, MD  latanoprost (XALATAN) 0.005 % ophthalmic solution Place 1 drop into both eyes at bedtime. 02/24/15   Historical Provider, MD  lisinopril (PRINIVIL,ZESTRIL) 5 MG tablet Take 5 mg by mouth daily.    Historical Provider, MD  loratadine (CLARITIN)  10 MG tablet Take 10 mg by mouth daily.    Historical Provider, MD  MEGARED OMEGA-3 KRILL OIL 500 MG CAPS Take 1 tablet by mouth daily.    Historical Provider, MD  montelukast (SINGULAIR) 10 MG tablet Take 10 mg by mouth at bedtime.    Historical Provider, MD  Multiple Vitamins-Minerals (MULTIVITAMIN WITH MINERALS) tablet Take 1 tablet by mouth daily.    Historical Provider, MD  Naproxen Sodium (ALEVE PO) Take 2 capsules by mouth 2 (two) times daily.    Historical Provider, MD  OVER THE COUNTER MEDICATION Take 2 capsules by mouth daily. Peenuts (for prostate health)    Historical Provider, MD  prednisoLONE acetate (PRED FORTE) 1 % ophthalmic suspension Place 1 drop  into the left eye every 6 (six) hours. 05/15/15   Historical Provider, MD  Probiotic Product (PROBIOTIC MATURE ADULT PO) Take 1 capsule by mouth daily.    Historical Provider, MD  saw palmetto 500 MG capsule Take 500 mg by mouth daily.    Historical Provider, MD  tamsulosin (FLOMAX) 0.4 MG CAPS capsule Take 1 capsule by mouth daily. 07/02/15 07/01/16  Historical Provider, MD  timolol (TIMOPTIC) 0.5 % ophthalmic solution Place 1 drop into the left eye 2 (two) times daily. 04/03/15   Historical Provider, MD  valACYclovir (VALTREX) 1000 MG tablet TAKE 1 TABLET(S) BY MOUTH ONCE A DAY FOR 30 DAYS 04/15/15   Historical Provider, MD     Social History   Social History  . Marital status: Married    Spouse name: N/A  . Number of children: N/A  . Years of education: N/A   Occupational History  . Retired    Social History Main Topics  . Smoking status: Former Smoker    Quit date: 04/14/1974  . Smokeless tobacco: Former NeurosurgeonUser    Quit date: 04/14/1974  . Alcohol use No  . Drug use: No  . Sexual activity: Not on file   Other Topics Concern  . Not on file   Social History Narrative  . No narrative on file    Family Status  Relation Status  . Mother   . Father   . Brother    Family History  Problem Relation Age of Onset  . Diabetes Mother   . Heart disease Father   . Heart disease Brother      ROS:  Full 14 point review of systems complete and found to be negative unless listed above.  Physical Exam: Blood pressure (!) 82/63, pulse 69, temperature 98.4 F (36.9 C), temperature source Oral, resp. rate 19, weight 222 lb (100.7 kg), SpO2 100 %.  General: Well developed, well nourished, male in no acute distress Head: Evidence of prior burr hole; normal eyelids Lungs: CTA Heart: HRRR S1 S2, no rub/gallop, Heart irregular rate and rhythm with S1, S2  murmur. pulses are 2+ all 4 extrem.   Neck: No carotid bruits. No lymphadenopathy.  JVD not elevated Abdomen: Bowel sounds present, abdomen soft  and non-tender without masses or hernias noted. Msk:  No spine or cva tenderness. No weakness, no joint deformities or effusions. Extremities: No clubbing or cyanosis. Trace edema.  Neuro: Alert and oriented X 3. No focal deficits noted. Psych:  Good affect, responds appropriately Skin: No rashes or lesions noted.  Labs:   Lab Results  Component Value Date   WBC 8.5 06/05/2016   HGB 11.5 (L) 06/05/2016   HCT 37.9 (L) 06/05/2016   MCV 87.3 06/05/2016   PLT 275 06/05/2016  No results for input(s): INR in the last 72 hours.   Recent Labs Lab 06/05/16 1730  NA 138  K 4.5  CL 114*  CO2 17*  BUN 24*  CREATININE 1.24  CALCIUM 9.4  GLUCOSE 184*   Magnesium  Date Value Ref Range Status  06/05/2016 2.1 1.7 - 2.4 mg/dL Final    Lipase  Date/Time Value Ref Range Status  01/04/2012 08:48 AM 164 73 - 393 Unit/L Final    ECG:  Sinus rhythm with ventricular pacing and PVCs.  Radiology:  Dg Chest Port 1 View  Result Date: 06/05/2016 CLINICAL DATA:  Ventricular tachycardia. EXAM: PORTABLE CHEST 1 VIEW COMPARISON:  None. FINDINGS: Transcutaneous pacers obscure the left side of the heart and left lower lobe. No pneumothorax. No pulmonary nodules or masses. Suspected pulmonary venous congestion without overt edema. Cardiomegaly. No other acute abnormalities. Recommend follow-up to resolution. The patient may benefit from a PA and lateral chest x-ray before discharge. IMPRESSION: Cardiomegaly and suspected pulmonary venous congestion. No overt edema. Recommend a PA and lateral chest x-ray before discharge. Electronically Signed   By: Gerome Sam III M.D   On: 06/05/2016 17:51    ASSESSMENT AND PLAN:    SignedTheodore Demark, Cordelia Poche 06/05/2016 6:49 PM Beeper [phone]  Co-Sign MD As above, patient seen and examined. Briefly he is an 82 year old male followed in [place] with past medical history of atrial fibrillation, prior pacemaker, cardiomyopathy (last echocardiogram in  2015  Showed ejection fraction 45%, right atrial and right ventricular enlargement, moderate MR and tricuspid regurgitation), COPD, prior subdural hematoma secondary to trauma requiring evacuation with syncope and wide complex tachycardia. Patient has chronic dyspnea on exertion but no orthopnea or PND. Mild pedal edema. He does not have chest pain or palpitations. Today he felt somewhat weak while standing on his porch. He subsequently had a syncopal episode. There was no preceding chest pain, palpitations, dyspnea, nausea and no orthostatic symptoms. His wife heard him fall. Afterwards he felt diaphoretic, dyspneic with increased weakness. EMS was called and he was found to be in a wide-complex tachycardia.  He apparently received adenosine and amiodarone with no effect. On arrival to the emergency room  He underwent cardioversion because of decreased blood pressure. He now feels somewhat improved. He remains weak but without other complaints.  Electrocardiogram shows wide complex tachycardia at a rate of 227. Potassium 4.5.  1 wide complex tachycardia-device was interrogated.  This is likely ventricular tachycardia. Patient is now in sinus rhythm status post cardioversion. Admit to CCU. We will continue IV amiodarone. Check magnesium.  Schedule echocardiogram to assess LV function.  We will keep NPO after midnight. EP consultation tomorrow morning. He will likely need cardiac catheterization.  We will cycle enzymes. 2 history of mildly reduced LV function-hold lisinopril for now as blood pressure is borderline. We will add beta-blockade and resume ACE inhibitor tomorrow if blood pressure has improved. 3 status post pacemaker-follow-up electrophysiology. 4 history of atrial fibrillation-we will add IV heparin for now. Continue low-dose aspirin. He has declined anticoagulation  In the past. CHADSvasc 4; will readdress prior to DC. Heck TSH and echocardiogram. 5 COPD  Olga Millers, MD

## 2016-06-05 NOTE — ED Notes (Signed)
Verbal order to discontinue Amiodarone by Dr. Francoise Ceo.

## 2016-06-05 NOTE — ED Provider Notes (Signed)
[facility]-EMERGENCY DEPT Provider Note   CSN: [id number] Arrival date & time: 06/05/16  1718     History   Chief Complaint Chief Complaint  Patient presents with  . Irregular Heart Beat    HPI Douglas Washington is a 82 y.o. male.  HPI History of A. fib, COPD, pacemaker placement presents for fast heartbeat. He is reporting working outside when he noticed heart rate became elevated. He became lightheaded and had is down. EMS) his heart rate about 200, they believed to be in V. tach. They gave him amiodarone and adenosine without improvement. Patient was alert and oriented and normotensive for EMS. Patient denies chest pain.  Past Medical History:  Diagnosis Date  . Allergy   . Atrial fibrillation (HCC)   . COPD (chronic obstructive pulmonary disease) (HCC)   . Hearing loss   . Hypertension   . Pacemaker   . Sebaceous cyst    Upper back  . Traumatic brain injury [facility]) 01/2005    Patient Active Problem List   Diagnosis Date Noted  . Sebaceous cyst 05/16/2015  . Benign essential HTN 01/31/2015  . Chronic systolic heart failure (HCC) 08/28/2014  . MI (mitral incompetence) 08/28/2014  . Obstructive apnea 08/28/2014  . Chest pain 04/28/2014  . A-fib (HCC) 03/14/2014  . Bradycardia 03/14/2014  . Combined fat and carbohydrate induced hyperlipemia 03/14/2014    Past Surgical History:  Procedure Laterality Date  . CRANIOTOMY  03/2005  . SHOULDER ACROMIOPLASTY Bilateral   . TOTAL HIP ARTHROPLASTY Left 2005       Home Medications    Prior to Admission medications   Medication Sig Start Date End Date Taking? Authorizing Provider  albuterol (PROVENTIL HFA;VENTOLIN HFA) 108 (90 BASE) MCG/ACT inhaler Inhale 2 puffs into the lungs every 6 (six) hours as needed for wheezing or shortness of breath.    Historical Provider, MD  aspirin 81 MG tablet Take 81 mg by mouth daily.    Historical Provider, MD  brimonidine (ALPHAGAN) 0.2 % ophthalmic solution Place 1 drop into the left eye 2 (two)  times daily. 04/27/15   Historical Provider, MD  cetirizine (ZYRTEC) 10 MG tablet Take 10 mg by mouth daily.    Historical Provider, MD  dorzolamide (TRUSOPT) 2 % ophthalmic solution Place 1 drop into both eyes 2 (two) times daily. 04/30/15   Historical Provider, MD  erythromycin ophthalmic ointment Place 1 application into the left eye every other day. 04/03/15   Historical Provider, MD  fluticasone (FLONASE) 50 MCG/ACT nasal spray Place 2 sprays into both nostrils daily. 06/03/15   Historical Provider, MD  glucosamine-chondroitin 500-400 MG tablet Take 1 tablet by mouth daily.     Historical Provider, MD  latanoprost (XALATAN) 0.005 % ophthalmic solution Place 1 drop into both eyes at bedtime. 02/24/15   Historical Provider, MD  lisinopril (PRINIVIL,ZESTRIL) 5 MG tablet Take 5 mg by mouth daily.    Historical Provider, MD  loratadine (CLARITIN) 10 MG tablet Take 10 mg by mouth daily.    Historical Provider, MD  MEGARED OMEGA-3 KRILL OIL 500 MG CAPS Take 1 tablet by mouth daily.    Historical Provider, MD  montelukast (SINGULAIR) 10 MG tablet Take 10 mg by mouth at bedtime.    Historical Provider, MD  Multiple Vitamins-Minerals (MULTIVITAMIN WITH MINERALS) tablet Take 1 tablet by mouth daily.    Historical Provider, MD  Naproxen Sodium (ALEVE PO) Take 2 capsules by mouth 2 (two) times daily.    Historical Provider, MD  OVER THE  COUNTER MEDICATION Take 2 capsules by mouth daily. Peenuts (for prostate health)    Historical Provider, MD  prednisoLONE acetate (PRED FORTE) 1 % ophthalmic suspension Place 1 drop into the left eye every 6 (six) hours. 05/15/15   Historical Provider, MD  Probiotic Product (PROBIOTIC MATURE ADULT PO) Take 1 capsule by mouth daily.    Historical Provider, MD  saw palmetto 500 MG capsule Take 500 mg by mouth daily.    Historical Provider, MD  tamsulosin (FLOMAX) 0.4 MG CAPS capsule Take 1 capsule by mouth daily. 07/02/15 07/01/16  Historical Provider, MD  timolol (TIMOPTIC) 0.5 %  ophthalmic solution Place 1 drop into the left eye 2 (two) times daily. 04/03/15   Historical Provider, MD  valACYclovir (VALTREX) 1000 MG tablet TAKE 1 TABLET(S) BY MOUTH ONCE A DAY FOR 30 DAYS 04/15/15   Historical Provider, MD    Family History Family History  Problem Relation Age of Onset  . Diabetes Mother   . Heart disease Father   . Heart disease Brother     Social History Social History  Substance Use Topics  . Smoking status: Former Smoker    Quit date: 04/14/1974  . Smokeless tobacco: Former Neurosurgeon    Quit date: 04/14/1974  . Alcohol use No     Allergies   Ciprofloxacin; Oxycodone; Penicillins; Simvastatin; and Sulfa antibiotics   Review of Systems Review of Systems  Constitutional: Negative for chills and fever.  HENT: Negative for ear pain and sore throat.   Eyes: Negative for pain and visual disturbance.  Respiratory: Negative for cough and shortness of breath.   Cardiovascular: Positive for palpitations. Negative for chest pain.  Gastrointestinal: Negative for abdominal pain and vomiting.  Genitourinary: Negative for dysuria and hematuria.  Musculoskeletal: Negative for arthralgias and back pain.  Skin: Negative for color change and rash.  Neurological: Positive for syncope and light-headedness. Negative for seizures.  All other systems reviewed and are negative.    Physical Exam Updated Vital Signs BP 108/75 (BP Location: Left Arm)   Pulse (!) 230   Wt 100.7 kg   SpO2 100%   BMI 31.85 kg/m   Physical Exam  Constitutional: He appears well-developed and well-nourished. He appears distressed.  HENT:  Head: Normocephalic and atraumatic.  Eyes: Conjunctivae are normal.  Neck: Neck supple.  Cardiovascular: An irregular rhythm present. Tachycardia present.   No murmur heard. Pulmonary/Chest: Effort normal and breath sounds normal. No respiratory distress.  Abdominal: Soft. There is no tenderness.  Musculoskeletal: He exhibits no edema.  Neurological: He  is alert.  Skin: There is pallor.  Cool, diaphoretic  Psychiatric: He has a normal mood and affect.  Nursing note and vitals reviewed.    ED Treatments / Results  Labs (all labs ordered are listed, but only abnormal results are displayed) Labs Reviewed  CBC WITH DIFFERENTIAL/PLATELET - Abnormal; Notable for the following:       Result Value   Hemoglobin 11.5 (*)    HCT 37.9 (*)    RDW 16.8 (*)    All other components within normal limits  BASIC METABOLIC PANEL  BLOOD GAS, VENOUS  MAGNESIUM  I-STAT CHEM 8, ED  I-STAT TROPOININ, ED    EKG  EKG Interpretation  Date/Time:  Thursday June 05 2016 17:34:31 EDT Ventricular Rate:  91 PR Interval:    QRS Duration: 169 QT Interval:  370 QTC Calculation: 373 R Axis:   -71 Text Interpretation:  Sinus rhythm Multiform ventricular premature complexes Nonspecific IVCD with LAD LVH  with secondary repolarization abnormality with QRS widening Since last tracing rate slower Confirmed by FLOYD MD, DANIEL [phone]([id number]) on 06/05/2016 5:41:04 PM       Radiology Dg Chest Port 1 View  Result Date: 06/05/2016 CLINICAL DATA:  Ventricular tachycardia. EXAM: PORTABLE CHEST 1 VIEW COMPARISON:  None. FINDINGS: Transcutaneous pacers obscure the left side of the heart and left lower lobe. No pneumothorax. No pulmonary nodules or masses. Suspected pulmonary venous congestion without overt edema. Cardiomegaly. No other acute abnormalities. Recommend follow-up to resolution. The patient may benefit from a PA and lateral chest x-ray before discharge. IMPRESSION: Cardiomegaly and suspected pulmonary venous congestion. No overt edema. Recommend a PA and lateral chest x-ray before discharge. Electronically Signed   By: Gerome Samavid  Williams III M.D   On: 06/05/2016 17:51    Procedures .Cardioversion Date/Time: 06/05/2016 6:36 PM Performed by: Marcelina MorelSUPPLES, Ludger Bones Authorized by: Marcelina MorelSUPPLES, Jaleah Lefevre   Consent:    Consent obtained:  Emergent situation Pre-procedure details:      Cardioversion basis:  Emergent   Rhythm:  Ventricular tachycardia   Electrode placement:  Anterior-posterior Attempt one:    Cardioversion mode:  Synchronous   Waveform:  Biphasic   Shock (Joules):  100   Shock outcome:  Conversion to normal sinus rhythm  .Sedation Date/Time: 06/05/2016 6:36 PM Performed by: Marcelina MorelSUPPLES, Sabrea Sankey Authorized by: Marcelina MorelSUPPLES, Virgie Kunda   Consent:    Consent obtained:  Emergent situation Indications:    Procedure performed:  Cardioversion   Procedure necessitating sedation performed by:  Physician performing sedation   Intended level of sedation:  Moderate (conscious sedation) Pre-sedation assessment:    NPO status caution: urgency dictates proceeding with non-ideal NPO status     ASA classification: class 2 - patient with mild systemic disease     Neck mobility: normal     Mouth opening:  3 or more finger widths   Thyromental distance:  3 finger widths   Mallampati score:  II - soft palate, uvula, fauces visible   Pre-sedation assessments completed and reviewed: airway patency, cardiovascular function, mental status, nausea/vomiting and respiratory function   Immediate pre-procedure details:    Reassessment: Patient reassessed immediately prior to procedure     Reviewed: vital signs     Verified: bag valve mask available, emergency equipment available, intubation equipment available, IV patency confirmed, oxygen available, reversal medications available and suction available   Procedure details (see MAR for exact dosages):    Preoxygenation:  Nonrebreather mask   Sedation:  Etomidate   Intra-procedure monitoring:  Blood pressure monitoring, cardiac monitor, continuous capnometry, continuous pulse oximetry, frequent LOC assessments and frequent vital sign checks   Intra-procedure events: hypoxia     Intra-procedure management:  Airway repositioning, supplemental oxygen and fluid bolus Post-procedure details:    Post-sedation assessments completed and  reviewed: airway patency, cardiovascular function, hydration status, mental status, nausea/vomiting, pain level and respiratory function     Specimens recovered:  None   Patient is stable for discharge or admission: yes     Patient tolerance:  Tolerated well, no immediate complications   (including critical care time)  Medications Ordered in ED Medications  etomidate (AMIDATE) injection 10 mg (10 mg Intravenous Given 06/05/16 1733)  sodium chloride 0.9 % bolus 1,000 mL (1,000 mLs Intravenous New Bag/Given 06/05/16 1728)  amiodarone (NEXTERONE PREMIX) 360-4.14 MG/200ML-% (1.8 mg/mL) IV infusion (60 mg/hr Intravenous New Bag/Given 06/05/16 1730)     Initial Impression / Assessment and Plan / ED Course  I have reviewed the triage vital signs and the  nursing notes.  Pertinent labs & imaging results that were available during my care of the patient were reviewed by me and considered in my medical decision making (see chart for details).  Clinical Course    Patient arrived in ventricular tachycardia, distressed, pale cold diaphoretic. Patient initially stable. Amiodarone infusion was started. Patient's blood pressure began to decrease, so emergent procedural sedation and cardioversion was performed successfully, as above. EKG shows regular rhythm with multiple PVCs. Negative Sgarbossa criteria. Cardiology was consulted.  CBC with stable anemia.  BMP with mild NAGMA, no lyte abnormality.  Patient admitted to cardiology.   Final Clinical Impressions(s) / ED Diagnoses   Final diagnoses:  None    New Prescriptions New Prescriptions   No medications on file     Marcelina Morel, MD 06/05/16 2015    Melene Plan, DO 06/05/16 2025    Melene Plan, DO 06/05/16 2345

## 2016-06-05 NOTE — ED Notes (Addendum)
Per EMS - Pt was working outside and noticed that his heart rate was elevated. Pt's wife said that the Pt got dizzy and fell. He came inside and sat down in his chair. Pt states that his HR was not going down so his wife called 911. EMS arrived and the HR was >200, they gave the Pt a bolus of and adenosine twice w/ no change.  Upon arrival the pt's HR was >200 and noted to have a wide QRS complex. Questinable V-Tach. Upon arrival to the ED Pt was diaphoretic, alert and oriented with a pulse. MD came to room upon arrival. Pt placed on ZOLL w/ pads and monitor immediately upon arrival. Highest HR noted was 230.

## 2016-06-05 NOTE — Progress Notes (Signed)
ANTICOAGULATION CONSULT NOTE - Initial Consult  Pharmacy Consult for heparin Indication: chest pain/ACS  Allergies  Allergen Reactions  . Penicillins Anaphylaxis and Swelling    Has patient had a PCN reaction causing immediate rash, facial/tongue/throat swelling, SOB or lightheadedness with hypotension: Yes Has patient had a PCN reaction causing severe rash involving mucus membranes or skin necrosis: No Has patient had a PCN reaction that required hospitalization No Has patient had a PCN reaction occurring within the last 10 years: No If all of the above answers are "NO", then may proceed with Cephalosporin use.   . Adhesive [Tape] Other (See Comments)    Tears skin  . Ciprofloxacin Other (See Comments)    Unknown  . Oxycodone Other (See Comments)    Hallucinations  . Simvastatin Other (See Comments)    Weakness  . Statins Other (See Comments)    Weakness, couldn't walk  . Sulfa Antibiotics Swelling    Patient Measurements: Weight: 222 lb (100.7 kg) Heparin Dosing Weight: 96 kg  Vital Signs: Temp: 98.4 F (36.9 C) (08/24 1725) Temp Source: Oral (08/24 1725) BP: 118/69 (08/24 1900) Pulse Rate: 68 (08/24 1845)  Labs:  Recent Labs  06/05/16 1730 06/05/16 1858  HGB 11.5* 12.9*  HCT 37.9* 38.0*  PLT 275  --   CREATININE 1.24 1.20    CrCl cannot be calculated (Unknown ideal weight.).   Medical History: Past Medical History:  Diagnosis Date  . Allergy   . Atrial fibrillation (HCC)   . COPD (chronic obstructive pulmonary disease) (HCC)   . Hearing loss   . Hypertension   . Pacemaker   . Sebaceous cyst    Upper back  . Traumatic brain injury [facility]) 01/2005    Medications:  See EMR   Assessment: 82 yo male will be started on heparin gtt for AFib and ACS. SCr 1.2, CBC wnl. Admitted with VTach - successful cardioversion in ED.   Goal of Therapy:  Heparin level 0.3-0.7 units/ml Monitor platelets by anticoagulation protocol: Yes    Plan:  -Heparin 4000  units x1 then 1300 units/hr -Heparin level in 8 hours -Daily HL, CBC -F/u plans for cardiac cath -Monitor plan for long term anticoagulation     Agapito Games, PharmD, BCPS Clinical Pharmacist 06/05/2016 7:57 PM

## 2016-06-05 NOTE — ED Notes (Signed)
Pt resting in bed with family at the bedside

## 2016-06-06 ENCOUNTER — Inpatient Hospital Stay (HOSPITAL_COMMUNITY): Payer: Medicare Other

## 2016-06-06 ENCOUNTER — Encounter (HOSPITAL_COMMUNITY): Payer: Self-pay | Admitting: Nurse Practitioner

## 2016-06-06 ENCOUNTER — Encounter (HOSPITAL_COMMUNITY)
Admission: EM | Disposition: A | Discharge status: Home / Self Care | Payer: Self-pay | Source: Home / Self Care | Attending: Cardiology

## 2016-06-06 DIAGNOSIS — I481 Persistent atrial fibrillation: Secondary | ICD-10-CM

## 2016-06-06 DIAGNOSIS — I2510 Atherosclerotic heart disease of native coronary artery without angina pectoris: Secondary | ICD-10-CM

## 2016-06-06 DIAGNOSIS — I509 Heart failure, unspecified: Secondary | ICD-10-CM

## 2016-06-06 DIAGNOSIS — I459 Conduction disorder, unspecified: Secondary | ICD-10-CM

## 2016-06-06 DIAGNOSIS — I214 Non-ST elevation (NSTEMI) myocardial infarction: Secondary | ICD-10-CM

## 2016-06-06 HISTORY — PX: CARDIAC CATHETERIZATION: SHX172

## 2016-06-06 LAB — COMPREHENSIVE METABOLIC PANEL
ALBUMIN: 2.9 g/dL — AB (ref 3.5–5.0)
ALK PHOS: 58 U/L (ref 38–126)
ALT: 79 U/L — AB (ref 17–63)
ANION GAP: 8 (ref 5–15)
AST: 81 U/L — ABNORMAL HIGH (ref 15–41)
BUN: 20 mg/dL (ref 6–20)
CALCIUM: 8.8 mg/dL — AB (ref 8.9–10.3)
CHLORIDE: 112 mmol/L — AB (ref 101–111)
CO2: 18 mmol/L — AB (ref 22–32)
CREATININE: 0.97 mg/dL (ref 0.61–1.24)
GFR calc Af Amer: >60 mL/min (ref >60)
GFR calc non Af Amer: >60 mL/min (ref >60)
GLUCOSE: 95 mg/dL (ref 65–99)
Potassium: 3.9 mmol/L (ref 3.5–5.1)
SODIUM: 138 mmol/L (ref 135–145)
Total Bilirubin: 0.3 mg/dL (ref 0.3–1.2)
Total Protein: 5.4 g/dL — ABNORMAL LOW (ref 6.5–8.1)

## 2016-06-06 LAB — HEPARIN LEVEL (UNFRACTIONATED): HEPARIN UNFRACTIONATED: 0.74 [IU]/mL — AB (ref 0.30–0.70)

## 2016-06-06 LAB — CBC
HEMATOCRIT: 31.1 % — AB (ref 39.0–52.0)
HEMOGLOBIN: 9.5 g/dL — AB (ref 13.0–17.0)
MCH: 26.5 pg (ref 26.0–34.0)
MCHC: 30.5 g/dL (ref 30.0–36.0)
MCV: 86.9 fL (ref 78.0–100.0)
Platelets: 213 10*3/uL (ref 150–400)
RBC: 3.58 MIL/uL — AB (ref 4.22–5.81)
RDW: 17.1 % — ABNORMAL HIGH (ref 11.5–15.5)
WBC: 6.2 10*3/uL (ref 4.0–10.5)

## 2016-06-06 LAB — ECHOCARDIOGRAM COMPLETE
HEIGHTINCHES: 70 in
Weight: 3502.67 oz

## 2016-06-06 LAB — MRSA PCR SCREENING: MRSA by PCR: NEGATIVE

## 2016-06-06 LAB — TROPONIN I
Troponin I: 5.14 ng/mL (ref <0.03)
Troponin I: 4.56 ng/mL (ref <0.03)

## 2016-06-06 LAB — POCT ACTIVATED CLOTTING TIME: ACTIVATED CLOTTING TIME: 164 s

## 2016-06-06 SURGERY — LEFT HEART CATH AND CORONARY ANGIOGRAPHY

## 2016-06-06 MED ORDER — IOPAMIDOL (ISOVUE-370) INJECTION 76%
INTRAVENOUS | Status: DC | PRN
  Administered 2016-06-06: 120 mL via INTRA_ARTERIAL

## 2016-06-06 MED ORDER — IOPAMIDOL (ISOVUE-370) INJECTION 76%
INTRAVENOUS | Status: AC
  Filled 2016-06-06: qty 100

## 2016-06-06 MED ORDER — SODIUM CHLORIDE 0.9% FLUSH: 3.0000 mL | INTRAVENOUS | Status: DC | PRN

## 2016-06-06 MED ORDER — HEPARIN (PORCINE) IN NACL 2-0.9 UNIT/ML-% IJ SOLN
INTRAMUSCULAR | Status: AC
  Filled 2016-06-06: qty 1000

## 2016-06-06 MED ORDER — MIDAZOLAM HCL 2 MG/2ML IJ SOLN
INTRAMUSCULAR | Status: DC | PRN
  Administered 2016-06-06: 1 mg via INTRAVENOUS

## 2016-06-06 MED ORDER — CARVEDILOL 6.25 MG PO TABS
6.2500 mg | ORAL_TABLET | Freq: Two times a day (BID) | ORAL | Status: DC
  Administered 2016-06-06 – 2016-06-09 (×7): 6.25 mg via ORAL
  Filled 2016-06-06 (×7): qty 1

## 2016-06-06 MED ORDER — HEPARIN SODIUM (PORCINE) 1000 UNIT/ML IJ SOLN
INTRAMUSCULAR | Status: DC | PRN
  Administered 2016-06-06: 5000 [IU] via INTRAVENOUS

## 2016-06-06 MED ORDER — HEPARIN (PORCINE) IN NACL 2-0.9 UNIT/ML-% IJ SOLN
INTRAMUSCULAR | Status: DC | PRN
  Administered 2016-06-06: 1000 mL

## 2016-06-06 MED ORDER — HEPARIN SODIUM (PORCINE) 1000 UNIT/ML IJ SOLN
INTRAMUSCULAR | Status: AC
  Filled 2016-06-06: qty 1

## 2016-06-06 MED ORDER — SODIUM CHLORIDE 0.9 % IV SOLN: INTRAVENOUS | Status: DC

## 2016-06-06 MED ORDER — CARVEDILOL 3.125 MG PO TABS
3.1250 mg | ORAL_TABLET | Freq: Two times a day (BID) | ORAL | Status: DC
  Administered 2016-06-06: 3.125 mg via ORAL
  Filled 2016-06-06: qty 1

## 2016-06-06 MED ORDER — MIDAZOLAM HCL 2 MG/2ML IJ SOLN
INTRAMUSCULAR | Status: AC
  Filled 2016-06-06: qty 2

## 2016-06-06 MED ORDER — VERAPAMIL HCL 2.5 MG/ML IV SOLN
INTRAVENOUS | Status: AC
  Filled 2016-06-06: qty 2

## 2016-06-06 MED ORDER — SODIUM CHLORIDE 0.9 % IV SOLN: 250.0000 mL | INTRAVENOUS | Status: DC | PRN

## 2016-06-06 MED ORDER — SODIUM CHLORIDE 0.9% FLUSH
3.0000 mL | Freq: Two times a day (BID) | INTRAVENOUS | Status: DC
Start: 2016-06-06 — End: 2016-06-09
  Administered 2016-06-06 – 2016-06-09 (×4): 3 mL via INTRAVENOUS

## 2016-06-06 MED ORDER — HEPARIN (PORCINE) IN NACL 2-0.9 UNIT/ML-% IJ SOLN
INTRAMUSCULAR | Status: DC | PRN
  Administered 2016-06-06: 10 mL via INTRA_ARTERIAL

## 2016-06-06 MED ORDER — HEPARIN (PORCINE) IN NACL 100-0.45 UNIT/ML-% IJ SOLN
1200.0000 [IU]/h | INTRAMUSCULAR | Status: DC
  Administered 2016-06-06: 1200 [IU]/h via INTRAVENOUS
  Filled 2016-06-06: qty 250

## 2016-06-06 MED ORDER — LIDOCAINE HCL (PF) 1 % IJ SOLN
INTRAMUSCULAR | Status: DC | PRN
  Administered 2016-06-06: 25 mL via INTRADERMAL

## 2016-06-06 MED ORDER — SODIUM CHLORIDE 0.9% FLUSH: 3.0000 mL | Freq: Two times a day (BID) | INTRAVENOUS | Status: DC

## 2016-06-06 MED ORDER — LIDOCAINE HCL (PF) 1 % IJ SOLN
INTRAMUSCULAR | Status: AC
  Filled 2016-06-06: qty 30

## 2016-06-06 MED ORDER — AMIODARONE HCL 200 MG PO TABS
200.0000 mg | ORAL_TABLET | Freq: Two times a day (BID) | ORAL | Status: DC
  Administered 2016-06-06 – 2016-06-09 (×6): 200 mg via ORAL
  Filled 2016-06-06 (×6): qty 1

## 2016-06-06 MED ORDER — ASPIRIN 81 MG PO CHEW: 81.0000 mg | CHEWABLE_TABLET | ORAL | Status: DC

## 2016-06-06 SURGICAL SUPPLY — 14 items
CATH INFINITI 5 FR JL3.5 (CATHETERS) ×3 IMPLANT
CATH INFINITI 5FR AL1 (CATHETERS) ×3 IMPLANT
CATH INFINITI 5FR MULTPACK ANG (CATHETERS) ×3 IMPLANT
CATH LAUNCHER 5F EBU3.5 (CATHETERS) ×3 IMPLANT
DEVICE RAD COMP TR BAND LRG (VASCULAR PRODUCTS) ×3 IMPLANT
GLIDESHEATH SLEND SS 6F .021 (SHEATH) ×3 IMPLANT
KIT HEART LEFT (KITS) ×3 IMPLANT
PACK CARDIAC CATHETERIZATION (CUSTOM PROCEDURE TRAY) ×3 IMPLANT
SHEATH PINNACLE 5F 10CM (SHEATH) ×3 IMPLANT
TRANSDUCER W/STOPCOCK (MISCELLANEOUS) ×3 IMPLANT
TUBING CIL FLEX 10 FLL-RA (TUBING) ×3 IMPLANT
WIRE EMERALD 3MM-J .035X260CM (WIRE) ×3 IMPLANT
WIRE EMERALD ST .035X260CM (WIRE) ×3 IMPLANT
WIRE HI TORQ VERSACORE-J 145CM (WIRE) ×3 IMPLANT

## 2016-06-06 NOTE — Progress Notes (Signed)
    Subjective:  Denies CP; mild dyspnea   Objective:  Vitals:   06/06/16 0300 06/06/16 0400 06/06/16 0500 06/06/16 0600  BP: 114/67 (!) 111/59 (!) 117/58 (!) 115/58  Pulse: 65 68 (!) 54 (!) 56  Resp: 18 (!) 21 17 (!) 22  Temp:      TempSrc:      SpO2: 100% 100% 100% 100%  Weight:      Height:        Intake/Output from previous day:  Intake/Output Summary (Last 24 hours) at 06/06/16 9244 Last data filed at 06/06/16 0600  Gross per 24 hour  Intake           646.24 ml  Output              625 ml  Net            21.24 ml    Physical Exam: Physical exam: Well-developed well-nourished in no acute distress.  Skin is warm and dry.  HEENT is normal.  Neck is supple.  Chest is clear to auscultation with normal expansion.  Cardiovascular exam is regular rate and rhythm.  Abdominal exam nontender or distended. No masses palpated. Extremities show no edema. neuro grossly intact    Lab Results: Basic Metabolic Panel:  Recent Labs  62/86/38 1730 06/05/16 1858 06/05/16 2115 06/06/16 0449  NA 138 145  --  138  K 4.5 4.5  --  3.9  CL 114* 114*  --  112*  CO2 17*  --   --  18*  GLUCOSE 184* 178*  --  95  BUN 24* 25*  --  20  CREATININE 1.24 1.20  --  0.97  CALCIUM 9.4  --   --  8.8*  MG 2.1  --  2.1  --    CBC:  Recent Labs  06/05/16 1730 06/05/16 1858  WBC 8.5  --   NEUTROABS 5.7  --   HGB 11.5* 12.9*  HCT 37.9* 38.0*  MCV 87.3  --   PLT 275  --    Cardiac Enzymes:  Recent Labs  06/05/16 2115 06/06/16 0449  TROPONINI 4.05* 5.14*     Assessment/Plan:  82 year old male with past medical history of pacemaker and mildly reduced LV function presented following syncope and wide complex tachycardia felt to be ventricular tachycardia. Patient underwent emergent cardioversion in the emergency room.  1 wide complex tachycardia/syncope-device was interrogated.  This appears to be ventricular tachycardia. Patient remains in sinus rhythm status post  cardioversion. We will continue IV amiodarone. Add low dose coreg. Schedule echocardiogram to assess LV function.  Enzymes mildly abnormal; proceed with cardiac cath (risks and benefits including myocardial infarction, CVA and death discussed and patient agrees to proceed). EP consult. 2 history of mildly reduced LV function-hold lisinopril for now as blood pressure is borderline. Add carvedilol 3.125 mg twice a day. Increase as tolerated. 3 status post pacemaker-interrogation per electrophysiology. 4 history of atrial fibrillation-continue IV heparin. Continue low-dose aspirin. He has declined anticoagulation  In the past. CHADSvasc 4; will readdress prior to DC. Check echocardiogram. TSH normal. 5 COPD 6 non-ST elevation myocardial infarction-patient has ruled in. However he was in ventricular tachycardia and underwent cardioversion.Elevated enzymes may be partially related to this. Treat with aspirin and heparin. Add carvedilol 3.125 mg twice a day. Patient intolerant to statins. Proceed with catheterization as outlined above.  Olga Millers 06/06/2016, 7:22 AM

## 2016-06-06 NOTE — Interval H&P Note (Signed)
History and Physical Interval Note:  06/06/2016 1:58 PM  Douglas Washington  has presented today for cardiac cath with the diagnosis of VT, elevated troponin. The various methods of treatment have been discussed with the patient and family. After consideration of risks, benefits and other options for treatment, the patient has consented to  Procedure(s): Left Heart Cath and Coronary Angiography (N/A) as a surgical intervention .  The patient's history has been reviewed, patient examined, no change in status, stable for surgery.  I have reviewed the patient's chart and labs.  Questions were answered to the patient's satisfaction.    Cath Lab Visit (complete for each Cath Lab visit)  Clinical Evaluation Leading to the Procedure:   ACS: Yes.    Non-ACS:    Anginal Classification: CCS II  Anti-ischemic medical therapy: No Therapy  Non-Invasive Test Results: No non-invasive testing performed  Prior CABG: No previous CABG    Verne Carrow

## 2016-06-06 NOTE — Progress Notes (Signed)
CRITICAL VALUE ALERT  Critical value received:  Troponin 4.05  Date of notification:  06/05/2016  Time of notification:  21:26  Critical value read back:Yes.    Nurse who received alert:  Kennith Center, RN  MD notified (1st page):  Dr. Loney Loh  Time of first page:  22:00  Responding MD:  Dr. Loney Loh  Time MD responded:  22:10  Critical value discussed with MD. Pt not in acute distress, denies any SOB, denies any chest pain, VSS. Likely will have cath tomorrow. Already on heparin gtt. No new orders at this time.  Peyton Bottoms, RN 06/05/2016 22:30

## 2016-06-06 NOTE — H&P (View-Only) (Signed)
    Subjective:  Denies CP; mild dyspnea   Objective:  Vitals:   06/06/16 0300 06/06/16 0400 06/06/16 0500 06/06/16 0600  BP: 114/67 (!) 111/59 (!) 117/58 (!) 115/58  Pulse: 65 68 (!) 54 (!) 56  Resp: 18 (!) 21 17 (!) 22  Temp:      TempSrc:      SpO2: 100% 100% 100% 100%  Weight:      Height:        Intake/Output from previous day:  Intake/Output Summary (Last 24 hours) at 06/06/16 0722 Last data filed at 06/06/16 0600  Gross per 24 hour  Intake           646.24 ml  Output              625 ml  Net            21.24 ml    Physical Exam: Physical exam: Well-developed well-nourished in no acute distress.  Skin is warm and dry.  HEENT is normal.  Neck is supple.  Chest is clear to auscultation with normal expansion.  Cardiovascular exam is regular rate and rhythm.  Abdominal exam nontender or distended. No masses palpated. Extremities show no edema. neuro grossly intact    Lab Results: Basic Metabolic Panel:  Recent Labs  06/05/16 1730 06/05/16 1858 06/05/16 2115 06/06/16 0449  NA 138 145  --  138  K 4.5 4.5  --  3.9  CL 114* 114*  --  112*  CO2 17*  --   --  18*  GLUCOSE 184* 178*  --  95  BUN 24* 25*  --  20  CREATININE 1.24 1.20  --  0.97  CALCIUM 9.4  --   --  8.8*  MG 2.1  --  2.1  --    CBC:  Recent Labs  06/05/16 1730 06/05/16 1858  WBC 8.5  --   NEUTROABS 5.7  --   HGB 11.5* 12.9*  HCT 37.9* 38.0*  MCV 87.3  --   PLT 275  --    Cardiac Enzymes:  Recent Labs  06/05/16 2115 06/06/16 0449  TROPONINI 4.05* 5.14*     Assessment/Plan:  82-year-old male with past medical history of pacemaker and mildly reduced LV function presented following syncope and wide complex tachycardia felt to be ventricular tachycardia. Patient underwent emergent cardioversion in the emergency room.  1 wide complex tachycardia/syncope-device was interrogated.  This appears to be ventricular tachycardia. Patient remains in sinus rhythm status post  cardioversion. We will continue IV amiodarone. Add low dose coreg. Schedule echocardiogram to assess LV function.  Enzymes mildly abnormal; proceed with cardiac cath (risks and benefits including myocardial infarction, CVA and death discussed and patient agrees to proceed). EP consult. 2 history of mildly reduced LV function-hold lisinopril for now as blood pressure is borderline. Add carvedilol 3.125 mg twice a day. Increase as tolerated. 3 status post pacemaker-interrogation per electrophysiology. 4 history of atrial fibrillation-continue IV heparin. Continue low-dose aspirin. He has declined anticoagulation  In the past. CHADSvasc 4; will readdress prior to DC. Check echocardiogram. TSH normal. 5 COPD 6 non-ST elevation myocardial infarction-patient has ruled in. However he was in ventricular tachycardia and underwent cardioversion.Elevated enzymes may be partially related to this. Treat with aspirin and heparin. Add carvedilol 3.125 mg twice a day. Patient intolerant to statins. Proceed with catheterization as outlined above.  Armondo Cech 06/06/2016, 7:22 AM   

## 2016-06-06 NOTE — Progress Notes (Addendum)
Site area: Right  Groin a 5 french arterial sheath was removed by Marko Plume  Site Prior to Removal:  Level 0  Pressure Applied For 15 MINUTES    Bedrest Beginning at  1550p  Manual:   Yes.    Patient Status During Pull:  stable  Post Pull Groin Site:  Level 0  Post Pull Instructions Given:  Yes.    Post Pull Pulses Present:  Yes.    Dressing Applied:  Yes.    Comments:  VS remain stable during sheath pull

## 2016-06-06 NOTE — Progress Notes (Signed)
Patient set up on CPAP using nasal mask and 12 cmH20 as per home regiment.  Room air, patient tolerated well and is familiar with equipment and procedure.

## 2016-06-06 NOTE — Research (Signed)
LEADERS FREE II Informed Consent   Subject Name: Douglas Washington  Subject met inclusion and exclusion criteria.  The informed consent form, study requirements and expectations were reviewed with the subject and questions and concerns were addressed prior to the signing of the consent form.  The subject verbalized understanding of the trail requirements.  The subject agreed to participate in the LEADERS FREE II trial and signed the informed consent.  The informed consent was obtained prior to performance of any protocol-specific procedures for the subject.  A copy of the signed informed consent was given to the subject and a copy was placed in the subject's medical record. If he meets angiographic criteria he may be a candidate for the BIOFREEDOM stent.  Berneda Rose 06/06/2016, 1:14 PM

## 2016-06-06 NOTE — Consult Note (Signed)
ELECTROPHYSIOLOGY CONSULT NOTE    Patient ID: Douglas Washington MRN: [id number], DOB/AGE: 1929/03/30 82 y.o.  Admit date: 06/05/2016 Date of Consult: 06/06/2016  Primary Physician: Rozanna Box, MD Primary Cardiologist: Gwen Pounds Referring MD: Jens Som   Reason for Consultation: WCT  HPI:  Douglas Washington is a 82 y.o. male with a past medical history significant for persistent atrial fibrillation, sinus bradycardia s/p MDT PPM, hypertension, prior SDH, OSA on CPAP.  He is followed by Dr Laurena Bering in [place] and has done well recently. Yesterday, he was getting up from the porch to go inside and get ready to go out to dinner. He was walking into the house when he passed out. He did not notice heart racing but did know he was about to lose consciousness.  EMS was called and on their arrival, he was tachycardic and given adenosine with no response.  In the ER, he was hypotensive and underwent urgent DCCV which restored SR. He has been placed on amiodarone and heparin drip. EP has been asked to evaluate for treatment options.    He states that he has had some brief dizzy spells in the last few weeks, always when standing.  He has not had chest pain or shortness of breath. No prior frank syncope.  No recent fevers or chills.    Plan is for echo and LHC today.   He has not previously been anticoagulated 2/2 prior SDH and patient's concern for recurrent bleeding.  Device interrogation demonstrates that he has been in AF for the last several months with controlled ventricular rate.    Past Medical History:  Diagnosis Date  . Allergy   . COPD (chronic obstructive pulmonary disease) (HCC)   . Hearing loss   . Hypertension   . OSA on CPAP   . Persistent atrial fibrillation (HCC)   . Sebaceous cyst    Upper back  . Sinus bradycardia    s/p MDT PPM   . Traumatic brain injury [facility]) 01/2005     Surgical History:  Past Surgical History:  Procedure Laterality Date  . CRANIOTOMY  03/2005  . SHOULDER  ACROMIOPLASTY Bilateral   . TOTAL HIP ARTHROPLASTY Left 2005     Prescriptions Prior to Admission  Medication Sig Dispense Refill Last Dose  . albuterol (PROVENTIL HFA;VENTOLIN HFA) 108 (90 BASE) MCG/ACT inhaler Inhale 2 puffs into the lungs every 6 (six) hours as needed for wheezing or shortness of breath.   06/05/2016 at Unknown time  . aspirin 81 MG tablet Take 81 mg by mouth daily.   06/05/2016 at 0745  . brimonidine (ALPHAGAN) 0.2 % ophthalmic solution Place 1 drop into the left eye 2 (two) times daily.  5 06/05/2016 at Unknown time  . cetirizine (ZYRTEC) 10 MG tablet Take 10 mg by mouth daily.   06/05/2016 at Unknown time  . dorzolamide (TRUSOPT) 2 % ophthalmic solution Place 1 drop into both eyes 2 (two) times daily.   06/05/2016 at Unknown time  . erythromycin ophthalmic ointment Place 1 application into both eyes every other day. Every other night  3 06/04/2016 at Unknown time  . glucosamine-chondroitin 500-400 MG tablet Take 1 tablet by mouth daily.    06/05/2016 at Unknown time  . latanoprost (XALATAN) 0.005 % ophthalmic solution Place 1 drop into both eyes at bedtime.   06/04/2016 at Unknown time  . lisinopril (PRINIVIL,ZESTRIL) 5 MG tablet Take 5 mg by mouth daily.   06/05/2016 at Unknown time  . MEGARED OMEGA-3 KRILL OIL  500 MG CAPS Take 500 mg by mouth daily.    06/05/2016 at Unknown time  . montelukast (SINGULAIR) 10 MG tablet Take 10 mg by mouth at bedtime.   06/04/2016 at Unknown time  . Multiple Vitamins-Minerals (MULTIVITAMIN WITH MINERALS) tablet Take 1 tablet by mouth daily.   06/05/2016 at Unknown time  . Naproxen Sodium 220 MG CAPS Take 220 mg by mouth 2 (two) times daily.   06/05/2016 at Unknown time  . prednisoLONE acetate (PRED FORTE) 1 % ophthalmic suspension Place 1 drop into the left eye every other day.    06/04/2016 at Unknown time  . QVAR 80 MCG/ACT inhaler Inhale 1 puff into the lungs 2 (two) times daily.    06/05/2016 at Unknown time  . saw palmetto 500 MG capsule Take 500 mg  by mouth daily.   06/05/2016 at Unknown time  . timolol (TIMOPTIC) 0.5 % ophthalmic solution Place 1 drop into the left eye 2 (two) times daily.  4 06/05/2016 at Unknown time  . valACYclovir (VALTREX) 500 MG tablet Take 500 mg by mouth daily.   06/05/2016 at Unknown time    Inpatient Medications:  . aspirin  81 mg Oral Daily  . [START ON 06/07/2016] aspirin  81 mg Oral Pre-Cath  . brimonidine  1 drop Left Eye BID  . budesonide (PULMICORT) nebulizer solution  0.25 mg Nebulization BID  . carvedilol  3.125 mg Oral BID WC  . dorzolamide  1 drop Both Eyes BID  . erythromycin  1 application Both Eyes QODAY  . latanoprost  1 drop Both Eyes QHS  . loratadine  10 mg Oral Daily  . montelukast  10 mg Oral QHS  . multivitamin with minerals  1 tablet Oral Daily  . prednisoLONE acetate  1 drop Left Eye QODAY  . sodium chloride flush  3 mL Intravenous Q12H  . sodium chloride flush  3 mL Intravenous Q12H  . timolol  1 drop Left Eye BID  . valACYclovir  500 mg Oral Daily    Allergies:  Allergies  Allergen Reactions  . Penicillins Anaphylaxis and Swelling    Has patient had a PCN reaction causing immediate rash, facial/tongue/throat swelling, SOB or lightheadedness with hypotension: Yes Has patient had a PCN reaction causing severe rash involving mucus membranes or skin necrosis: No Has patient had a PCN reaction that required hospitalization No Has patient had a PCN reaction occurring within the last 10 years: No If all of the above answers are "NO", then may proceed with Cephalosporin use.   . Adhesive [Tape] Other (See Comments)    Tears skin  . Ciprofloxacin Other (See Comments)    Unknown  . Oxycodone Other (See Comments)    Hallucinations  . Simvastatin Other (See Comments)    Weakness  . Statins Other (See Comments)    Weakness, couldn't walk  . Sulfa Antibiotics Swelling    Social History   Social History  . Marital status: Married    Spouse name: N/A  . Number of children: N/A    . Years of education: N/A   Occupational History  . Retired    Social History Main Topics  . Smoking status: Former Smoker    Quit date: 04/14/1974  . Smokeless tobacco: Former NeurosurgeonUser    Quit date: 04/14/1974  . Alcohol use No  . Drug use: No  . Sexual activity: Not on file   Other Topics Concern  . Not on file   Social History Narrative  . No  narrative on file     Family History  Problem Relation Age of Onset  . Diabetes Mother   . Heart disease Father   . Heart disease Brother      Review of Systems: All other systems reviewed and are otherwise negative except as noted above.  Physical Exam: Vitals:   06/06/16 0600 06/06/16 0700 06/06/16 0753 06/06/16 0800  BP: (!) 115/58 (!) 114/54  121/70  Pulse: (!) 56 63  61  Resp: (!) 22 17  14   Temp:      TempSrc:      SpO2: 100% 98% 100% 100%  Weight:      Height:        GEN- The patient is elderly appearing, alert and oriented x 3 today.   HEENT: normocephalic, atraumatic; sclera clear, conjunctiva pink; hearing intact; oropharynx clear; neck supple  Lungs- Clear to ausculation bilaterally, normal work of breathing.  No wheezes, rales, rhonchi Heart- Regular rate and rhythm  GI- soft, non-tender, non-distended, bowel sounds present  Extremities- no clubbing, cyanosis, or edema; DP/PT/radial pulses 2+ bilaterally MS- no significant deformity or atrophy Skin- warm and dry, no rash or lesion Psych- euthymic mood, full affect Neuro- strength and sensation are intact  Labs:   Lab Results  Component Value Date   WBC 8.5 06/05/2016   HGB 12.9 (L) 06/05/2016   HCT 38.0 (L) 06/05/2016   MCV 87.3 06/05/2016   PLT 275 06/05/2016     Recent Labs Lab 06/06/16 0449  NA 138  K 3.9  CL 112*  CO2 18*  BUN 20  CREATININE 0.97  CALCIUM 8.8*  PROT 5.4*  BILITOT 0.3  ALKPHOS 58  ALT 79*  AST 81*  GLUCOSE 95      Radiology/Studies: Dg Chest Port 1 View Result Date: 06/05/2016 CLINICAL DATA:  Ventricular  tachycardia. EXAM: PORTABLE CHEST 1 VIEW COMPARISON:  None. FINDINGS: Transcutaneous pacers obscure the left side of the heart and left lower lobe. No pneumothorax. No pulmonary nodules or masses. Suspected pulmonary venous congestion without overt edema. Cardiomegaly. No other acute abnormalities. Recommend follow-up to resolution. The patient may benefit from a PA and lateral chest x-ray before discharge. IMPRESSION: Cardiomegaly and suspected pulmonary venous congestion. No overt edema. Recommend a PA and lateral chest x-ray before discharge. Electronically Signed   By: Gerome Sam III M.D   On: 06/05/2016 17:51    EKG: ventricular tachycardia, rate 227  TELEMETRY: atrial pacing with intrinsic ventricular conduction, PVC's, some NSVT   DEVICE HISTORY: MDT dual chamber PPM implanted 12/2009 for sinus bradycardia    Assessment/Plan: 1.  Ventricular tachycardia The patient had hemodynamically unstable VT requiring cardioversion.  Catheterization and echo pending  Agree with IV amiodarone for now Continue Coreg, Lisinopril - up-titrate BB as able Further recommendations depending on cath results No driving x6 months - pt aware  2.  Persistent atrial fibrillation Converted to SR with cardioversion yesterday for VT Continue IV Heparin Will need to reconsider anticoagulation CHADS2VASC is 4.  There is mention of Watchman in Dr Donna Christen note. Will discuss further with patient post cath today  3.  HTN Stable No change required today  4.  Sinus bradycardia Normal pacemaker function Will obtain full device interrogation today  Dr Johney Frame to see later today  Signed, Gypsy Balsam, NP 06/06/2016 9:03 AM  I have seen, examined the patient, and reviewed the above assessment and plan.  On exam, RRR.  Changes to above are made where necessary.   Syncope secondary  to VT.  He has nonrevascularizable coronary disease.  Preserved EF.  At this point, I would advise titration of coreg and  conversion of amiodarone to oral.  As per AVID data and given advanced age, would advise medical therapy with amiodarone rather than ICD upgrade at this time.  Will need further optimization of his coreg.   I am concerned that he has been cardioverted to sinus and carries very high risk of stroke related to his afib.  His chads2vasc score is at least 4.  He has tolerated ASA and heparin here.  Will obtain head CT tonight.  If CT is normal, will start eliquis 5mg  BID in am and stop heparin.  Would also stop ASA at that time.  Dr Ladona Ridgel to see in am.  Transfer to telemetry.  Possibly home tomorrow.  Will need close outpatient EP follow-up.  A high level of decision making was required for this complicated patient with multiple databases reviewed.    Co Sign: Hillis Range, MD 06/06/2016 5:35 PM

## 2016-06-06 NOTE — Progress Notes (Signed)
TR band removed at 1900 after 1 hr of deflation. No active bleeding noted. Site dressed per protocol.  Transferred pt to 3W. Site noted to have a soft hematoma and gauze saturated. Manual pressure held for 5 minutes. No active bleeding, hematoma resolved. Site dressed per protocol.

## 2016-06-06 NOTE — Progress Notes (Signed)
ANTICOAGULATION CONSULT NOTE - Follow-up Consult  Pharmacy Consult for heparin Indication: chest pain/ACS  Allergies  Allergen Reactions  . Penicillins Anaphylaxis and Swelling    Has patient had a PCN reaction causing immediate rash, facial/tongue/throat swelling, SOB or lightheadedness with hypotension: Yes Has patient had a PCN reaction causing severe rash involving mucus membranes or skin necrosis: No Has patient had a PCN reaction that required hospitalization No Has patient had a PCN reaction occurring within the last 10 years: No If all of the above answers are "NO", then may proceed with Cephalosporin use.   . Adhesive [Tape] Other (See Comments)    Tears skin  . Ciprofloxacin Other (See Comments)    Unknown  . Oxycodone Other (See Comments)    Hallucinations  . Simvastatin Other (See Comments)    Weakness  . Statins Other (See Comments)    Weakness, couldn't walk  . Sulfa Antibiotics Swelling    Patient Measurements: Height: 5\' 10"  (177.8 cm) Weight: 218 lb 14.7 oz (99.3 kg) IBW/kg (Calculated) : 73 Heparin Dosing Weight: 96 kg  Vital Signs: Temp: 97.9 F (36.6 C) (08/25 1235) Temp Source: Oral (08/25 1235) BP: 106/62 (08/25 1555) Pulse Rate: 62 (08/25 1557)  Labs:  Recent Labs  06/05/16 1730 06/05/16 1858 06/05/16 2115 06/06/16 0449 06/06/16 0741  HGB 11.5* 12.9*  --   --  9.5*  HCT 37.9* 38.0*  --   --  31.1*  PLT 275  --   --   --  213  LABPROT  --   --  15.6*  --   --   INR  --   --  1.23  --   --   HEPARINUNFRC  --   --   --  0.74*  --   CREATININE 1.24 1.20  --  0.97  --   TROPONINI  --   --  4.05* 5.14* 4.56*    Estimated Creatinine Clearance: 65.8 mL/min (by C-G formula based on SCr of 0.97 mg/dL).   Assessment: 82 yo male on heparin gtt for AFib and ACS. Patient is now s/p cath (plans for medical management) to resume heparin 8 hrs post sheath removal (done at ~ 3pm; TR band placed)  Prior to cath, eparin level was  0.73 on 1300  units/hr and infusion decrease to 1200 units/hr  Goal of Therapy:  Heparin level 0.3-0.7 units/ml Monitor platelets by anticoagulation protocol: Yes   Plan:  -Restart heparin at 1200 units/hr at 11pm -0700 heparin level -daily heparin level and CBC  Harland German, Pharm D 06/06/2016 5:11 PM

## 2016-06-06 NOTE — Progress Notes (Signed)
Report called to 3W Ray, RN

## 2016-06-06 NOTE — Progress Notes (Signed)
Echocardiogram 2D Echocardiogram has been performed.  Douglas Washington 06/06/2016, 3:15 PM

## 2016-06-06 NOTE — Progress Notes (Signed)
ANTICOAGULATION CONSULT NOTE - Follow-up Consult  Pharmacy Consult for heparin Indication: chest pain/ACS  Allergies  Allergen Reactions  . Penicillins Anaphylaxis and Swelling    Has patient had a PCN reaction causing immediate rash, facial/tongue/throat swelling, SOB or lightheadedness with hypotension: Yes Has patient had a PCN reaction causing severe rash involving mucus membranes or skin necrosis: No Has patient had a PCN reaction that required hospitalization No Has patient had a PCN reaction occurring within the last 10 years: No If all of the above answers are "NO", then may proceed with Cephalosporin use.   . Adhesive [Tape] Other (See Comments)    Tears skin  . Ciprofloxacin Other (See Comments)    Unknown  . Oxycodone Other (See Comments)    Hallucinations  . Simvastatin Other (See Comments)    Weakness  . Statins Other (See Comments)    Weakness, couldn't walk  . Sulfa Antibiotics Swelling    Patient Measurements: Height: 5\' 10"  (177.8 cm) Weight: 218 lb 14.7 oz (99.3 kg) IBW/kg (Calculated) : 73 Heparin Dosing Weight: 96 kg  Vital Signs: BP: 117/58 (08/25 0500) Pulse Rate: 54 (08/25 0500)  Labs:  Recent Labs  06/05/16 1730 06/05/16 1858 06/05/16 2115 06/06/16 0449  HGB 11.5* 12.9*  --   --   HCT 37.9* 38.0*  --   --   PLT 275  --   --   --   LABPROT  --   --  15.6*  --   INR  --   --  1.23  --   HEPARINUNFRC  --   --   --  0.74*  CREATININE 1.24 1.20  --   --   TROPONINI  --   --  4.05*  --     Estimated Creatinine Clearance: 53.2 mL/min (by C-G formula based on SCr of 1.2 mg/dL).   Assessment: 82 yo male on heparin gtt for AFib and ACS. Heparin level slightly supratherapeutic (0.74) on 1300 units/hr. No bleeding reported per RN.  Goal of Therapy:  Heparin level 0.3-0.7 units/ml Monitor platelets by anticoagulation protocol: Yes   Plan:  -Decrease heparin to 1200 units/hr -F/u 8 hr heparin level  Christoper Fabian, PharmD, BCPS Clinical  pharmacist, pager [phone] 06/06/2016 6:03 AM

## 2016-06-07 LAB — CBC
HEMATOCRIT: 30.8 % — AB (ref 39.0–52.0)
Hemoglobin: 9.2 g/dL — ABNORMAL LOW (ref 13.0–17.0)
MCH: 26.2 pg (ref 26.0–34.0)
MCHC: 29.9 g/dL — AB (ref 30.0–36.0)
MCV: 87.7 fL (ref 78.0–100.0)
Platelets: 212 10*3/uL (ref 150–400)
RBC: 3.51 MIL/uL — ABNORMAL LOW (ref 4.22–5.81)
RDW: 17.1 % — AB (ref 11.5–15.5)
WBC: 6.6 10*3/uL (ref 4.0–10.5)

## 2016-06-07 LAB — BASIC METABOLIC PANEL
Anion gap: 6 (ref 5–15)
BUN: 15 mg/dL (ref 6–20)
CALCIUM: 9.3 mg/dL (ref 8.9–10.3)
CO2: 24 mmol/L (ref 22–32)
CREATININE: 1.11 mg/dL (ref 0.61–1.24)
Chloride: 109 mmol/L (ref 101–111)
GFR calc Af Amer: >60 mL/min (ref >60)
GFR calc non Af Amer: 59 mL/min — ABNORMAL LOW (ref >60)
GLUCOSE: 109 mg/dL — AB (ref 65–99)
Potassium: 4.3 mmol/L (ref 3.5–5.1)
Sodium: 139 mmol/L (ref 135–145)

## 2016-06-07 LAB — HEMOGLOBIN A1C
Hgb A1c MFr Bld: 6.2 % — ABNORMAL HIGH (ref 4.8–5.6)
MEAN PLASMA GLUCOSE: 131 mg/dL

## 2016-06-07 LAB — HEPARIN LEVEL (UNFRACTIONATED): Heparin Unfractionated: 0.49 IU/mL (ref 0.30–0.70)

## 2016-06-07 MED ORDER — APIXABAN 5 MG PO TABS: 5.0000 mg | ORAL_TABLET | Freq: Two times a day (BID) | ORAL | Status: DC

## 2016-06-07 MED ORDER — FUROSEMIDE 10 MG/ML IJ SOLN
60.0000 mg | Freq: Once | INTRAMUSCULAR | Status: AC
Start: 2016-06-07 — End: 2016-06-07
  Administered 2016-06-07: 60 mg via INTRAVENOUS
  Filled 2016-06-07: qty 6

## 2016-06-07 MED ORDER — HEPARIN (PORCINE) IN NACL 100-0.45 UNIT/ML-% IJ SOLN: 1200.0000 [IU]/h | INTRAMUSCULAR | Status: AC

## 2016-06-07 MED ORDER — APIXABAN 5 MG PO TABS
5.0000 mg | ORAL_TABLET | Freq: Two times a day (BID) | ORAL | Status: DC
  Administered 2016-06-07 – 2016-06-08 (×3): 5 mg via ORAL
  Filled 2016-06-07 (×3): qty 1

## 2016-06-07 NOTE — Progress Notes (Signed)
Patient ID: Douglas BeeRoy L Lemme, male   DOB: 12/21/1928, 82 y.o.   MRN: [id number]    Patient Name: Douglas Washington Date of Encounter: 06/07/2016     Active Problems:   VT (ventricular tachycardia) (HCC)    SUBJECTIVE  Still a little dyspneic. No chest pain.  CURRENT MEDS . amiodarone  200 mg Oral BID  . aspirin  81 mg Oral Daily  . brimonidine  1 drop Left Eye BID  . budesonide (PULMICORT) nebulizer solution  0.25 mg Nebulization BID  . carvedilol  6.25 mg Oral BID WC  . dorzolamide  1 drop Both Eyes BID  . erythromycin  1 application Both Eyes QODAY  . latanoprost  1 drop Both Eyes QHS  . loratadine  10 mg Oral Daily  . montelukast  10 mg Oral QHS  . multivitamin with minerals  1 tablet Oral Daily  . prednisoLONE acetate  1 drop Left Eye QODAY  . sodium chloride flush  3 mL Intravenous Q12H  . sodium chloride flush  3 mL Intravenous Q12H  . timolol  1 drop Left Eye BID  . valACYclovir  500 mg Oral Daily    OBJECTIVE  Vitals:   06/07/16 0438 06/07/16 0743 06/07/16 0827 06/07/16 0856  BP: 113/61 108/62  108/62  Pulse: 64 68  68  Resp: 17 18    Temp: 98.4 F (36.9 C) 98.5 F (36.9 C)    TempSrc: Axillary Oral    SpO2: 98% 97% 98%   Weight: 218 lb 8 oz (99.1 kg)     Height:        Intake/Output Summary (Last 24 hours) at 06/07/16 0931 Last data filed at 06/07/16 0800  Gross per 24 hour  Intake           572.47 ml  Output             1725 ml  Net         -1152.53 ml   Filed Weights   06/05/16 2100 06/06/16 1941 06/07/16 0438  Weight: 218 lb 14.7 oz (99.3 kg) 219 lb 1.6 oz (99.4 kg) 218 lb 8 oz (99.1 kg)    PHYSICAL EXAM  General: Pleasant, NAD. Neuro: Alert and oriented X 3. Moves all extremities spontaneously. Psych: Normal affect. HEENT:  Normal  Neck: Supple without bruits or JVD. Lungs:  Resp regular and unlabored, CTA. Heart: RRR no s3, s4, or murmurs. Abdomen: Soft, non-tender, non-distended, BS + x 4.  Extremities: No clubbing, cyanosis or edema.  DP/PT/Radials 2+ and equal bilaterally.  Accessory Clinical Findings  CBC  Recent Labs  06/05/16 1730  06/06/16 0741 06/07/16 0608  WBC 8.5  --  6.2 6.6  NEUTROABS 5.7  --   --   --   HGB 11.5*  < > 9.5* 9.2*  HCT 37.9*  < > 31.1* 30.8*  MCV 87.3  --  86.9 87.7  PLT 275  --  213 212  < > = values in this interval not displayed. Basic Metabolic Panel  Recent Labs  06/05/16 1730  06/05/16 2115 06/06/16 0449 06/07/16 0608  NA 138  < >  --  138 139  K 4.5  < >  --  3.9 4.3  CL 114*  < >  --  112* 109  CO2 17*  --   --  18* 24  GLUCOSE 184*  < >  --  95 109*  BUN 24*  < >  --  20 15  CREATININE  1.24  < >  --  0.97 1.11  CALCIUM 9.4  --   --  8.8* 9.3  MG 2.1  --  2.1  --   --   < > = values in this interval not displayed. Liver Function Tests  Recent Labs  06/06/16 0449  AST 81*  ALT 79*  ALKPHOS 58  BILITOT 0.3  PROT 5.4*  ALBUMIN 2.9*   No results for input(s): LIPASE, AMYLASE in the last 72 hours. Cardiac Enzymes  Recent Labs  06/05/16 2115 06/06/16 0449 06/06/16 0741  TROPONINI 4.05* 5.14* 4.56*   BNP Invalid input(s): POCBNP D-Dimer No results for input(s): DDIMER in the last 72 hours. Hemoglobin A1C  Recent Labs  06/05/16 2115  HGBA1C 6.2*   Fasting Lipid Panel No results for input(s): CHOL, HDL, LDLCALC, TRIG, CHOLHDL, LDLDIRECT in the last 72 hours. Thyroid Function Tests  Recent Labs  06/05/16 2115  TSH 1.339    TELE  nsr with atrial pacing  Radiology/Studies  Ct Head Wo Contrast  Result Date: 06/06/2016 CLINICAL DATA:  Syncope headache EXAM: CT HEAD WITHOUT CONTRAST TECHNIQUE: Contiguous axial images were obtained from the base of the skull through the vertex without intravenous contrast. COMPARISON:  None. FINDINGS: Brain: Moderate atrophy. Mild chronic microvascular ischemic change in the white matter. Negative for acute infarct. Negative for hemorrhage or mass. Vascular: No hyperdense vessel or unexpected calcification.  Skull: Right craniotomy.  No acute skull abnormality. Sinuses/Orbits: Near complete opacification of the right maxillary sinus. Expansion of the maxillary sinus with multiple calcifications within the sinuses well as small gas bubbles. There is thinning of the medial wall of the maxillary sinus which is bowed inward. No definite mass extending lateral to the sinus or into the orbit. Mild mucosal edema in the frontal and ethmoid sinuses. Mastoid sinus clear bilaterally. Other: Negative IMPRESSION: Atrophy and chronic microvascular ischemic change. No acute intracranial abnormality Opacification of the right maxillary sinus with expansion and multiple calcific densities in the sinus. 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 Neoplasm not excluded. ENT evaluation suggested. Electronically Signed   By: Marlan Palau M.D.   On: 06/06/2016 21:42   Dg Chest Port 1 View  Result Date: 06/05/2016 CLINICAL DATA:  Ventricular tachycardia. EXAM: PORTABLE CHEST 1 VIEW COMPARISON:  None. FINDINGS: Transcutaneous pacers obscure the left side of the heart and left lower lobe. No pneumothorax. No pulmonary nodules or masses. Suspected pulmonary venous congestion without overt edema. Cardiomegaly. No other acute abnormalities. Recommend follow-up to resolution. The patient may benefit from a PA and lateral chest x-ray before discharge. IMPRESSION: Cardiomegaly and suspected pulmonary venous congestion. No overt edema. Recommend a PA and lateral chest x-ray before discharge. Electronically Signed   By: Gerome Sam III M.D   On: 06/05/2016 17:51    ASSESSMENT AND PLAN  1. VT with advanced age and multiple comorbidities - continue oral amiodarone. Would give 400 bid for 2 weeks then 400 daily for a month then 200 mg daily there after. 2. Dyspnea - multifactorial. He is still not back to baseline. Will give IV lasix today. 3. COPD - he has some limitation in physical activity due to this. Not on  oxygen at home. 4. Atrial fib - he is not thought to be a coumadin/Noac candidate long term. There was consideration for watchman but I am not sure how good a candidate he is with multiple comorbidities. 5. CAD - cath results noted with extensive calcification but no tight focal  stenosis 6. Acute on chronic diastolic heart failure - this was exacerbated by the VT. He is improved but not back to baseline.   Gregg Taylor,M.D.  06/07/2016 9:31 AM

## 2016-06-07 NOTE — Progress Notes (Signed)
Report received via Rey RN but patient hasn't arrived from [facility] at this time, reviewed orders, labs, VS, meds, tests and patient's general condition, assumed care of patient.

## 2016-06-07 NOTE — Progress Notes (Signed)
ANTICOAGULATION CONSULT NOTE - Follow-up Consult  Pharmacy Consult for heparin Indication: chest pain/ACS  Allergies  Allergen Reactions  . Penicillins Anaphylaxis and Swelling    Has patient had a PCN reaction causing immediate rash, facial/tongue/throat swelling, SOB or lightheadedness with hypotension: Yes Has patient had a PCN reaction causing severe rash involving mucus membranes or skin necrosis: No Has patient had a PCN reaction that required hospitalization No Has patient had a PCN reaction occurring within the last 10 years: No If all of the above answers are "NO", then may proceed with Cephalosporin use.   . Adhesive [Tape] Other (See Comments)    Tears skin  . Ciprofloxacin Other (See Comments)    Unknown  . Oxycodone Other (See Comments)    Hallucinations  . Simvastatin Other (See Comments)    Weakness  . Statins Other (See Comments)    Weakness, couldn't walk  . Sulfa Antibiotics Swelling    Patient Measurements: Height: 5\' 10"  (177.8 cm) Weight: 218 lb 8 oz (99.1 kg) IBW/kg (Calculated) : 73 Heparin Dosing Weight: 96 kg  Vital Signs: Temp: 98.5 F (36.9 C) (08/26 0743) Temp Source: Oral (08/26 0743) BP: 108/62 (08/26 0856) Pulse Rate: 68 (08/26 0856)  Labs:  Recent Labs  06/05/16 1730 06/05/16 1858 06/05/16 2115 06/06/16 0449 06/06/16 0741 06/07/16 0608  HGB 11.5* 12.9*  --   --  9.5* 9.2*  HCT 37.9* 38.0*  --   --  31.1* 30.8*  PLT 275  --   --   --  213 212  LABPROT  --   --  15.6*  --   --   --   INR  --   --  1.23  --   --   --   HEPARINUNFRC  --   --   --  0.74*  --  0.49  CREATININE 1.24 1.20  --  0.97  --  1.11  TROPONINI  --   --  4.05* 5.14* 4.56*  --     Estimated Creatinine Clearance: 57.4 mL/min (by C-G formula based on SCr of 1.11 mg/dL).   Assessment: 82 yo male on heparin gtt for AFib and ACS. Patient is now s/p cath and heparin was resumed last night at 2300 at 1200 units/hr. CBC is stable and no signs of bleeding noted.  Cath showed no stenosis and echo was normal.   HL this AM is 0.49 - Therapeutic X 1 on 1200 units/hr   Goal of Therapy:  Heparin level 0.3-0.7 units/ml Monitor platelets by anticoagulation protocol: Yes   Plan:  -Continue  heparin at 1200 units/hr  - 6 hour confirmatory heparin level -daily heparin level and CBC  Bailey Mech, Ilda Basset D Pharmacy Resident  Pager: [phone]  06/07/2016 11:22 AM

## 2016-06-08 ENCOUNTER — Inpatient Hospital Stay (HOSPITAL_COMMUNITY): Payer: Medicare Other

## 2016-06-08 ENCOUNTER — Encounter (HOSPITAL_COMMUNITY): Payer: Self-pay | Admitting: *Deleted

## 2016-06-08 LAB — BASIC METABOLIC PANEL
Anion gap: 5 (ref 5–15)
BUN: 14 mg/dL (ref 6–20)
CALCIUM: 9.1 mg/dL (ref 8.9–10.3)
CO2: 24 mmol/L (ref 22–32)
Chloride: 109 mmol/L (ref 101–111)
Creatinine, Ser: 1.01 mg/dL (ref 0.61–1.24)
GFR calc Af Amer: >60 mL/min (ref >60)
GLUCOSE: 119 mg/dL — AB (ref 65–99)
POTASSIUM: 3.8 mmol/L (ref 3.5–5.1)
SODIUM: 138 mmol/L (ref 135–145)

## 2016-06-08 LAB — CBC
HEMATOCRIT: 29.7 % — AB (ref 39.0–52.0)
Hemoglobin: 9.1 g/dL — ABNORMAL LOW (ref 13.0–17.0)
MCH: 26.5 pg (ref 26.0–34.0)
MCHC: 30.6 g/dL (ref 30.0–36.0)
MCV: 86.6 fL (ref 78.0–100.0)
PLATELETS: 194 10*3/uL (ref 150–400)
RBC: 3.43 MIL/uL — ABNORMAL LOW (ref 4.22–5.81)
RDW: 16.9 % — AB (ref 11.5–15.5)
WBC: 4.4 10*3/uL (ref 4.0–10.5)

## 2016-06-08 MED ORDER — AMIODARONE HCL 200 MG PO TABS: 200.0000 mg | ORAL_TABLET | Freq: Two times a day (BID) | ORAL | 11 refills | Status: DC

## 2016-06-08 MED ORDER — APIXABAN 5 MG PO TABS: 5.0000 mg | ORAL_TABLET | Freq: Two times a day (BID) | ORAL | 0 refills | Status: DC

## 2016-06-08 MED ORDER — CARVEDILOL 6.25 MG PO TABS: 6.2500 mg | ORAL_TABLET | Freq: Two times a day (BID) | ORAL | 11 refills | Status: DC

## 2016-06-08 MED ORDER — APIXABAN 5 MG PO TABS: 5.0000 mg | ORAL_TABLET | Freq: Two times a day (BID) | ORAL | 11 refills | Status: DC

## 2016-06-08 NOTE — Progress Notes (Signed)
Report received via Otelia Santee RN, updated on events of the day, VS, new meds and POC, assumed care of patient.

## 2016-06-08 NOTE — Progress Notes (Signed)
CT results reviewed with Dr Delton See. With moderate hydronephrosis, Urology was contacted. Dr Sherryl Barters advised he should see Urology, but this could be as outpatient. Contact information put on AVS. ?mass at cecum, family made aware, they will follow up with primary care for this.   No source of bleeding seen on CT. Dr Lubertha Basque note states that if no bleeding found, cancel d/c, hold Eliquis and repeat CBC in am.   Spoke with family by phone, they are agreeable to this plan. Orders written.   Ct Abdomen Pelvis Wo Contrast  Result Date: 06/08/2016 CLINICAL DATA:  Status post fall with bruising of the abdominal wall anemia. EXAM: CT ABDOMEN AND PELVIS WITHOUT CONTRAST TECHNIQUE: Multidetector CT imaging of the abdomen and pelvis was performed following the standard protocol without IV contrast. COMPARISON:  None. FINDINGS: Lower chest: Partially visualized dual lead cardiac pacemaker. Moderate in size hiatal hernia. Hepatobiliary: No mass visualized on this un-enhanced exam. Pancreas: No mass or inflammatory process identified on this un-enhanced exam. Spleen: Within normal limits in size. Adrenals/Urinary Tract: Atrophic kidneys with bilateral perirenal fat stranding. Bilateral nonobstructive nephrolithiasis. Moderate left hydronephrosis and proximal hydroureter caused by obstructing proximal left ureteral calculi. There are multiple calculi within the proximal left ureter, just distal to the UPJ, the largest and distal most calculus measures 18 mm. Exophytic superior pole right renal cyst. Stomach/Bowel: No evidence of obstruction, inflammatory process, or abnormal fluid collections. Scattered colonic diverticular and redundant of the colon noted. There is a mid masslike thickening within the cecum at the ileocecal junction. Vascular/Lymphatic: No pathologically enlarged lymph nodes. No evidence of abdominal aortic aneurysm. Calcific atherosclerotic disease of the aorta. Reproductive: Scattered calcifications  within the prostate gland. Other: No evidence of retroperitoneal hematoma. Mild fat stranding within the right groin likely postprocedural. No focal fluid collection. Small periumbilical fat containing anterior abdominal wall hernia. Musculoskeletal: No suspicious bone lesions identified. Multilevel osteoarthritic changes of the lumbosacral spine with posterior facet arthropathy and minimal anterolisthesis of L4 on L5. Left total hip arthroplasty. No evidence of fractures. IMPRESSION: No evidence of retroperitoneal or groin hematoma. Moderate in size hiatal hernia. Multiple obstructing proximal left ureteral stones, the distal most and largest of which measures 18 mm, causing moderate left hydronephrosis and proximal hydroureter. 2.5 cm masslike thickening within the cecum, at the ileocecal valve. This may represent fecal material, prominent ileocecal valve or potentially a soft tissue mass. Correlation with colonoscopy may be considered. Fat containing periumbilical hernia. These results will be called to the ordering clinician or representative by the Radiologist Assistant, and communication documented in the PACS or zVision Dashboard. Electronically Signed   By: Ted Mcalpine M.D.   On: 06/08/2016 13:48   Cecylia Brazill, Deneen Harts 06/08/2016 5:56 PM Beeper [phone]

## 2016-06-08 NOTE — Progress Notes (Signed)
Patient ID: ANGAS ISABELL, male   DOB: March 14, 1929, 82 y.o.   MRN: [id number]    Patient Name: Douglas Washington Date of Encounter: 06/08/2016     Active Problems:   VT (ventricular tachycardia) (HCC)    SUBJECTIVE  Dyspnea is improved.   CURRENT MEDS . amiodarone  200 mg Oral BID  . apixaban  5 mg Oral BID  . brimonidine  1 drop Left Eye BID  . budesonide (PULMICORT) nebulizer solution  0.25 mg Nebulization BID  . carvedilol  6.25 mg Oral BID WC  . dorzolamide  1 drop Both Eyes BID  . erythromycin  1 application Both Eyes QODAY  . latanoprost  1 drop Both Eyes QHS  . loratadine  10 mg Oral Daily  . montelukast  10 mg Oral QHS  . multivitamin with minerals  1 tablet Oral Daily  . prednisoLONE acetate  1 drop Left Eye QODAY  . sodium chloride flush  3 mL Intravenous Q12H  . sodium chloride flush  3 mL Intravenous Q12H  . timolol  1 drop Left Eye BID  . valACYclovir  500 mg Oral Daily    OBJECTIVE  Vitals:   06/07/16 1748 06/07/16 2140 06/08/16 0528 06/08/16 0854  BP: (!) 121/53 (!) 117/59 (!) 114/53 (!) 119/52  Pulse: 63 65 64 63  Resp: 18 18 18    Temp:  97.5 F (36.4 C) 98.2 F (36.8 C)   TempSrc:  Oral Oral   SpO2: 100% 100% 95%   Weight:   217 lb 14.4 oz (98.8 kg)   Height:        Intake/Output Summary (Last 24 hours) at 06/08/16 1050 Last data filed at 06/08/16 0900  Gross per 24 hour  Intake             1311 ml  Output             3451 ml  Net            -2140 ml   Filed Weights   06/06/16 1941 06/07/16 0438 06/08/16 0528  Weight: 219 lb 1.6 oz (99.4 kg) 218 lb 8 oz (99.1 kg) 217 lb 14.4 oz (98.8 kg)    PHYSICAL EXAM  General: Pleasant, elderly man, NAD. Neuro: Alert and oriented X 3. Moves all extremities spontaneously. Psych: Normal affect. HEENT:  Normal  Neck: Supple without bruits or JVD. Lungs:  Resp regular and unlabored, CTA. Heart: RRR no s3, s4, or murmurs. Abdomen: Soft, non-tender, non-distended, BS + x 4.  Extremities: No clubbing, cyanosis  or edema. DP/PT/Radials 2+ and equal bilaterally.  Accessory Clinical Findings  CBC  Recent Labs  06/05/16 1730  06/07/16 0608 06/08/16 0523  WBC 8.5  < > 6.6 4.4  NEUTROABS 5.7  --   --   --   HGB 11.5*  < > 9.2* 9.1*  HCT 37.9*  < > 30.8* 29.7*  MCV 87.3  < > 87.7 86.6  PLT 275  < > 212 194  < > = values in this interval not displayed. Basic Metabolic Panel  Recent Labs  06/05/16 1730  06/05/16 2115  06/07/16 0608 06/08/16 0523  NA 138  < >  --   < > 139 138  K 4.5  < >  --   < > 4.3 3.8  CL 114*  < >  --   < > 109 109  CO2 17*  --   --   < > 24 24  GLUCOSE 184*  < >  --   < >  109* 119*  BUN 24*  < >  --   < > 15 14  CREATININE 1.24  < >  --   < > 1.11 1.01  CALCIUM 9.4  --   --   < > 9.3 9.1  MG 2.1  --  2.1  --   --   --   < > = values in this interval not displayed. Liver Function Tests  Recent Labs  06/06/16 0449  AST 81*  ALT 79*  ALKPHOS 58  BILITOT 0.3  PROT 5.4*  ALBUMIN 2.9*   No results for input(s): LIPASE, AMYLASE in the last 72 hours. Cardiac Enzymes  Recent Labs  06/05/16 2115 06/06/16 0449 06/06/16 0741  TROPONINI 4.05* 5.14* 4.56*   BNP Invalid input(s): POCBNP D-Dimer No results for input(s): DDIMER in the last 72 hours. Hemoglobin A1C  Recent Labs  06/05/16 2115  HGBA1C 6.2*   Fasting Lipid Panel No results for input(s): CHOL, HDL, LDLCALC, TRIG, CHOLHDL, LDLDIRECT in the last 72 hours. Thyroid Function Tests  Recent Labs  06/05/16 2115  TSH 1.339    TELE  NSR  Radiology/Studies  Ct Head Wo Contrast  Result Date: 06/06/2016 CLINICAL DATA:  Syncope headache EXAM: CT HEAD WITHOUT CONTRAST TECHNIQUE: Contiguous axial images were obtained from the base of the skull through the vertex without intravenous contrast. COMPARISON:  None. FINDINGS: Brain: Moderate atrophy. Mild chronic microvascular ischemic change in the white matter. Negative for acute infarct. Negative for hemorrhage or mass. Vascular: No hyperdense  vessel or unexpected calcification. Skull: Right craniotomy.  No acute skull abnormality. Sinuses/Orbits: Near complete opacification of the right maxillary sinus. Expansion of the maxillary sinus with multiple calcifications within the sinuses well as small gas bubbles. There is thinning of the medial wall of the maxillary sinus which is bowed inward. No definite mass extending lateral to the sinus or into the orbit. Mild mucosal edema in the frontal and ethmoid sinuses. Mastoid sinus clear bilaterally. Other: Negative IMPRESSION: Atrophy and chronic microvascular ischemic change. No acute intracranial abnormality Opacification of the right maxillary sinus with expansion and multiple calcific densities in the sinus. 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 Neoplasm not excluded. ENT evaluation suggested. Electronically Signed   By: Marlan Palauharles  Clark M.D.   On: 06/06/2016 21:42   Dg Chest Port 1 View  Result Date: 06/05/2016 CLINICAL DATA:  Ventricular tachycardia. EXAM: PORTABLE CHEST 1 VIEW COMPARISON:  None. FINDINGS: Transcutaneous pacers obscure the left side of the heart and left lower lobe. No pneumothorax. No pulmonary nodules or masses. Suspected pulmonary venous congestion without overt edema. Cardiomegaly. No other acute abnormalities. Recommend follow-up to resolution. The patient may benefit from a PA and lateral chest x-ray before discharge. IMPRESSION: Cardiomegaly and suspected pulmonary venous congestion. No overt edema. Recommend a PA and lateral chest x-ray before discharge. Electronically Signed   By: Gerome Samavid  Williams III M.D   On: 06/05/2016 17:51    ASSESSMENT AND PLAN  1. VT - he is tolerating amiodarone well. Continue 400 mg daily. 2. Atrial fib - he has reverted to NSR after shock. He is on amiodarone. He was started on Eliquis 3. Anemia - his hgb has dropped 2 grams over 2 days since his heart cath. No obvious GI bleeding. I suspect it is in his groin. He  has some bruising there. He is asymptomatic. We will check a CT of abdomen and pelvis. If no blood to explain H/H drop, then we would delay dc  and stop Eliquis.  He will need his H/H checked when he comes in to see Amber on Friday of this coming week. 4. Chronic diastolic heart failure - his symptoms have improved nicely with lasix.  5. Disp. - he appears stable for DC. I have reviewed his H/H.   Gregg Taylor,M.D.  06/08/2016 10:50 AM

## 2016-06-08 NOTE — Discharge Summary (Signed)
Discharge Summary    Patient ID: Douglas Washington,  MRN: [id number], DOB/AGE: November 11, 1928 82 y.o.  Admit date: 06/05/2016 Discharge date: 06/09/2016  Primary Care Provider: Berenda Morale E Primary Cardiologist: Dr. Gwen Pounds (Dr. Johney Frame)  Discharge Diagnoses    Active Problems:   A-fib [facility])   Chronic systolic heart failure (HCC)   VT (ventricular tachycardia) (HCC)   Allergies Allergies  Allergen Reactions  . Penicillins Anaphylaxis and Swelling    Has patient had a PCN reaction causing immediate rash, facial/tongue/throat swelling, SOB or lightheadedness with hypotension: Yes Has patient had a PCN reaction causing severe rash involving mucus membranes or skin necrosis: No Has patient had a PCN reaction that required hospitalization No Has patient had a PCN reaction occurring within the last 10 years: No If all of the above answers are "NO", then may proceed with Cephalosporin use.   . Adhesive [Tape] Other (See Comments)    Tears skin  . Ciprofloxacin Other (See Comments)    Unknown  . Oxycodone Other (See Comments)    Hallucinations  . Simvastatin Other (See Comments)    Weakness  . Statins Other (See Comments)    Weakness, couldn't walk  . Sulfa Antibiotics Swelling    Diagnostic Studies/Procedures    LHC: 06/06/2016  Conclusion     Mid RCA lesion, 50 %stenosed.  Mid RCA to Dist RCA lesion, 10 %stenosed.  Prox RCA lesion, 20 %stenosed.  1st Mrg-2 lesion, 90 %stenosed.  1st Mrg-1 lesion, 30 %stenosed.  Mid LAD to Dist LAD lesion, 50 %stenosed.  Dist LAD lesion, 90 %stenosed.  Ost LAD to Mid LAD lesion, 20 %stenosed.   1. Ventricular tachycardia 2. Diffuse calcific disease in the distal LAD. The entire vessel is calcifed. The vessel is diffusely diseased throughout the mid segment with up to 50% stenosis. The distal segment has serial high grade stenoses. The distal vessel is small in caliber. This vessel is not favorable for PCI.  3. The large first  obtuse marginal branch of the Circumflex has severe disease in the very distal segment. The vessel is too small for PCI in this segment 4. There is a small second obtuse marginal branch that is occluded. This is a 1.5-1.75 mm vessel. Cannot exclude this as the culprit for his NSTEMI, VT. This vessel has been occluded for at least 24 hours. He has no chest pain. Troponin is now trending down.  5. Moderate mid RCA stenosis.   Recommendations: He has diffuse distal vessel disease in the LAD and OM. There is a small second OM branch that is occluded. There are no good targets for PCI. I would plan medical management of his CAD. EP team to follow up on echo and make plans for medical management of VT vs upgrade to ICD.    06/06/2016  Study Conclusions  - Left ventricle: The cavity size was normal. Wall thickness was   normal. Systolic function was normal. The estimated ejection   fraction was in the range of 55% to 60%. Probable mild   hypokinesis of the entireinferolateral myocardium. Doppler   parameters are consistent with elevated mean left atrial filling   pressure. - Aortic valve: There was trivial regurgitation. - Mitral valve: There was mild to moderate regurgitation directed   centrally. - Left atrium: The atrium was moderately to severely dilated. - Atrial septum: There was a very small secundum atrial septal   defect. There was a trivial left-to-right atrial level shunt, in   the baseline  state.  _____________   History of Present Illness     82 year old male with past medical history of an ICM with EF of 45%, PAF (refused anticoagulation), COPD, OSA on CPAP, and hypertension. On admission he noted having some extra congestion but no significant edema. Notes chronic dyspnea on exertion but had not changed much in the previous weeks. Stated he has been doing well and was being seen by Dr. Gwen Pounds. Presented to the [facility] ED on 06/05/2016 stating he had been in his usual state  of health up until a short period of time before presenting to the ER. States he had a syncopal episode, and woke up on the ground and could not get up. Wife had heard him fall but he was awake by the time she got to him. Reported sweating profusely, but no chest pain, palpitations or dyspnea. EMS was called to the scene and patient was noted to be very tachycardic, and IV was started with 500 mL bolus and adenosine 2 given without improvement in heart rate in route to the ER. On arrival to the ER his EKG showed a wide complex tachycardia, with heart rate 227. He was unstable and hypotensive, and required DCCV 1, with successful return of sinus rhythm but continuation of PVCs. Potassium in the ER with 4.5.  Hospital Course     Consultants: EP   He was admitted to the CCU, and started on IV amiodarone, his magnesium was checked and noted at 2.1. He was kept nothing by mouth after midnight with EP consultation the following day. Plan was to continue to cycle enzymes. His lisinopril was held on admission given his blood pressure was borderline, with plan to add beta-blockade and resume ACE inhibitor the following day if blood pressure improvement. IV heparin was started, along with continued low-dose aspirin. Of note he had declined anticoagulation in the past. His chadsvasc score was 4. TSH was checked and noted at 1.339  Follow-up 2-D echocardiogram showed EF of 55-60%, with mild hypokinesis of the entire inferior lateral myocardium, moderate Douglas, and severely dilated left atrium. Troponins were noted to peak at 5.14, and plan was made to proceed with catheterization. He remained on IV amiodarone, and low-dose Coreg was added on 06/06/2016. Of note patient was reported intolerant to statins. His aspirin and heparin were continued.  He underwent a left heart cath on 06/06/2016 with Dr. Clifton James showing diffuse distal vessel disease in the LAD and OM, along with a small second OM branch that was occluded. It  was noted there were no good targets for PCI, and recommendation to continue with medical management of his CAD. EP was to follow up on echo to make further plans for medical management of his VT. He was seen by Dr. Johney Frame for his syncope secondary to VT. Recommendations were to continue titration of Coreg and transition to oral amiodarone. It was noted that per AVID data and given advanced age, would advise medical therapy with amiodarone rather than ICD upgrade at this time. There was concern as he was cardioverted to sinus rhythm, and increased risk of stroke related to his A. fib. A head CT was obtained and was noted to be normal. He was started on Eliquis 5 mg twice a day, and transitioned from heparin. Decision was made to discontinue aspirin.  On 06/07/2016 he was transitioned to oral amiodarone, with dosing to be 400 mg daily for a month then 200 mg daily thereafter. The patient continued to complain of multifactorial dyspnea,  and was given IV Lasix.   On 06/08/2016 he was noted to be tolerating amiodarone well with continued sinus rhythm on telemetry. He did have a noted drop in his hemoglobin of 3 g over the 2 days since his cath. No obvious bleeding was noted. He was noted to be asymptomatic.   A CT of the abdomen and pelvis was ordered showing moderate hydronephrosis, plus an abnormality on the cecum. Urology was contacted about the hydronephrosis, but the MD stated the stone had been there for a long time, based on its size. He recommended outpatient urology follow up. Douglas Washington should also follow up with primary care, he may need a colonoscopy. A head CT done on admission also had an abnormality, ENT follow up recommended.  _____________  Discharge Vitals Blood pressure (!) 109/59, pulse 61, temperature 98.3 F (36.8 C), temperature source Oral, resp. rate 18, height 5\' 10"  (1.778 m), weight 213 lb 3.2 oz (96.7 kg), SpO2 98 %.  Filed Weights   06/07/16 0438 06/08/16 0528 06/09/16 0520    Weight: 218 lb 8 oz (99.1 kg) 217 lb 14.4 oz (98.8 kg) 213 lb 3.2 oz (96.7 kg)    Labs & Radiologic Studies    CBC  Recent Labs  06/08/16 0523 06/09/16 0333  WBC 4.4 5.6  HGB 9.1* 9.2*  HCT 29.7* 30.3*  MCV 86.6 86.8  PLT 194 208   Basic Metabolic Panel  Recent Labs  06/07/16 0608 06/08/16 0523  NA 139 138  K 4.3 3.8  CL 109 109  CO2 24 24  GLUCOSE 109* 119*  BUN 15 14  CREATININE 1.11 1.01  CALCIUM 9.3 9.1   Liver Function Tests No results for input(s): AST, ALT, ALKPHOS, BILITOT, PROT, ALBUMIN in the last 72 hours. Cardiac Enzymes No results for input(s): CKTOTAL, CKMB, CKMBINDEX, TROPONINI in the last 72 hours. Hemoglobin A1C No results for input(s): HGBA1C in the last 72 hours. Thyroid Function Tests No results for input(s): TSH, T4TOTAL, T3FREE, THYROIDAB in the last 72 hours.  Invalid input(s): FREET3 _____________  Ct Head Wo Contrast Result Date: 06/06/2016 CLINICAL DATA:  Syncope headache EXAM: CT HEAD WITHOUT CONTRAST TECHNIQUE: Contiguous axial images were obtained from the base of the skull through the vertex without intravenous contrast. COMPARISON:  None. FINDINGS: Brain: Moderate atrophy. Mild chronic microvascular ischemic change in the white matter. Negative for acute infarct. Negative for hemorrhage or mass. Vascular: No hyperdense vessel or unexpected calcification. Skull: Right craniotomy.  No acute skull abnormality. Sinuses/Orbits: Near complete opacification of the right maxillary sinus. Expansion of the maxillary sinus with multiple calcifications within the sinuses well as small gas bubbles. There is thinning of the medial wall of the maxillary sinus which is bowed inward. No definite mass extending lateral to the sinus or into the orbit. Mild mucosal edema in the frontal and ethmoid sinuses. Mastoid sinus clear bilaterally. Other: Negative IMPRESSION: Atrophy and chronic microvascular ischemic change. No acute intracranial abnormality  Opacification of the right maxillary sinus with expansion and multiple calcific densities in the sinus. 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 Neoplasm not excluded. ENT evaluation suggested. Electronically Signed   By: Marlan Palauharles  Clark M.D.   On: 06/06/2016 21:42   Dg Chest Port 1 View Result Date: 06/05/2016 CLINICAL DATA:  Ventricular tachycardia. EXAM: PORTABLE CHEST 1 VIEW COMPARISON:  None. FINDINGS: Transcutaneous pacers obscure the left side of the heart and left lower lobe. No pneumothorax. No pulmonary nodules or masses. Suspected pulmonary venous congestion  without overt edema. Cardiomegaly. No other acute abnormalities. Recommend follow-up to resolution. The patient may benefit from a PA and lateral chest x-ray before discharge. IMPRESSION: Cardiomegaly and suspected pulmonary venous congestion. No overt edema. Recommend a PA and lateral chest x-ray before discharge. Electronically Signed   By: Gerome Sam III M.D   On: 06/05/2016 17:51   Ct Abdomen Pelvis Wo Contrast Result Date: 06/08/2016 CLINICAL DATA:  Status post fall with bruising of the abdominal wall anemia. EXAM: CT ABDOMEN AND PELVIS WITHOUT CONTRAST TECHNIQUE: Multidetector CT imaging of the abdomen and pelvis was performed following the standard protocol without IV contrast. COMPARISON:  None. FINDINGS: Lower chest: Partially visualized dual lead cardiac pacemaker. Moderate in size hiatal hernia. Hepatobiliary: No mass visualized on this un-enhanced exam. Pancreas: No mass or inflammatory process identified on this un-enhanced exam. Spleen: Within normal limits in size. Adrenals/Urinary Tract: Atrophic kidneys with bilateral perirenal fat stranding. Bilateral nonobstructive nephrolithiasis. Moderate left hydronephrosis and proximal hydroureter caused by obstructing proximal left ureteral calculi. There are multiple calculi within the proximal left ureter, just distal to the UPJ, the largest and distal  most calculus measures 18 mm. Exophytic superior pole right renal cyst. Stomach/Bowel: No evidence of obstruction, inflammatory process, or abnormal fluid collections. Scattered colonic diverticular and redundant of the colon noted. There is a mid masslike thickening within the cecum at the ileocecal junction. Vascular/Lymphatic: No pathologically enlarged lymph nodes. No evidence of abdominal aortic aneurysm. Calcific atherosclerotic disease of the aorta. Reproductive: Scattered calcifications within the prostate gland. Other: No evidence of retroperitoneal hematoma. Mild fat stranding within the right groin likely postprocedural. No focal fluid collection. Small periumbilical fat containing anterior abdominal wall hernia. Musculoskeletal: No suspicious bone lesions identified. Multilevel osteoarthritic changes of the lumbosacral spine with posterior facet arthropathy and minimal anterolisthesis of L4 on L5. Left total hip arthroplasty. No evidence of fractures. IMPRESSION: No evidence of retroperitoneal or groin hematoma. Moderate in size hiatal hernia. Multiple obstructing proximal left ureteral stones, the distal most and largest of which measures 18 mm, causing moderate left hydronephrosis and proximal hydroureter. 2.5 cm masslike thickening within the cecum, at the ileocecal valve. This may represent fecal material, prominent ileocecal valve or potentially a soft tissue mass. Correlation with colonoscopy may be considered. Fat containing periumbilical hernia. These results will be called to the ordering clinician or representative by the Radiologist Assistant, and communication documented in the PACS or zVision Dashboard. Electronically Signed   By: Ted Mcalpine M.D.   On: 06/08/2016 13:48    Disposition   Pt is being discharged home today in good condition.  Follow-up Plans & Appointments    Follow-up Information    Gypsy Balsam, NP Follow up on 06/12/2016.   Specialty:  Cardiology Why:  at  12noon lab draw to follow Contact information: [address] [place] [address] [phone]        [facility] .   Why:  Call and make an appointment to be seen within 2 weeks. Contact information: [address] [place] [address] [place] [address] [phone]           Discharge Medications   Current Discharge Medication List    START taking these medications   Details  amiodarone (PACERONE) 200 MG tablet Take 1 tablet (200 mg total) by mouth 2 (two) times daily. 1 tablet 2 x day for 1 month, then 1 tablet daily. Qty: 60 tablet, Refills: 11    apixaban (ELIQUIS) 5 MG TABS tablet  Take 1 tablet (5 mg total) by mouth 2 (two) times daily. Qty: 60 tablet, Refills: 11    carvedilol (COREG) 6.25 MG tablet Take 1 tablet (6.25 mg total) by mouth 2 (two) times daily with a meal. Qty: 60 tablet, Refills: 11      CONTINUE these medications which have NOT CHANGED   Details  albuterol (PROVENTIL HFA;VENTOLIN HFA) 108 (90 BASE) MCG/ACT inhaler Inhale 2 puffs into the lungs every 6 (six) hours as needed for wheezing or shortness of breath.    brimonidine (ALPHAGAN) 0.2 % ophthalmic solution Place 1 drop into the left eye 2 (two) times daily. Refills: 5    cetirizine (ZYRTEC) 10 MG tablet Take 10 mg by mouth daily.    dorzolamide (TRUSOPT) 2 % ophthalmic solution Place 1 drop into both eyes 2 (two) times daily.    erythromycin ophthalmic ointment Place 1 application into both eyes every other day. Every other night Refills: 3    glucosamine-chondroitin 500-400 MG tablet Take 1 tablet by mouth daily.     latanoprost (XALATAN) 0.005 % ophthalmic solution Place 1 drop into both eyes at bedtime.    lisinopril (PRINIVIL,ZESTRIL) 5 MG tablet Take 5 mg by mouth daily.    MEGARED OMEGA-3 KRILL OIL 500 MG CAPS Take 500 mg by mouth daily.     montelukast (SINGULAIR) 10 MG tablet Take 10 mg by mouth at bedtime.    Multiple Vitamins-Minerals (MULTIVITAMIN  WITH MINERALS) tablet Take 1 tablet by mouth daily.    Naproxen Sodium 220 MG CAPS Take 220 mg by mouth 2 (two) times daily.    prednisoLONE acetate (PRED FORTE) 1 % ophthalmic suspension Place 1 drop into the left eye every other day.     QVAR 80 MCG/ACT inhaler Inhale 1 puff into the lungs 2 (two) times daily.     saw palmetto 500 MG capsule Take 500 mg by mouth daily.    timolol (TIMOPTIC) 0.5 % ophthalmic solution Place 1 drop into the left eye 2 (two) times daily. Refills: 4    valACYclovir (VALTREX) 500 MG tablet Take 500 mg by mouth daily.      STOP taking these medications     aspirin 81 MG tablet            Outstanding Labs/Studies   CBC at office visit.   Duration of Discharge Encounter   Greater than 30 minutes including physician time.  Signed, Laverda Page NP-C 06/09/2016, 11:56 AM   06/09/16 AMMENDED to update medication list only, please refer to progress note 06/09/16 for today's visit details. Francis Dowse, PA-C  EP Attending  Patient seen and examined. Agree with above.   Leonia Reeves.D.

## 2016-06-09 ENCOUNTER — Other Ambulatory Visit: Payer: Self-pay | Admitting: Physician Assistant

## 2016-06-09 ENCOUNTER — Encounter (HOSPITAL_COMMUNITY): Payer: Self-pay | Admitting: Cardiovascular Disease

## 2016-06-09 DIAGNOSIS — D509 Iron deficiency anemia, unspecified: Secondary | ICD-10-CM

## 2016-06-09 LAB — CBC
HCT: 30.3 % — ABNORMAL LOW (ref 39.0–52.0)
HEMOGLOBIN: 9.2 g/dL — AB (ref 13.0–17.0)
MCH: 26.4 pg (ref 26.0–34.0)
MCHC: 30.4 g/dL (ref 30.0–36.0)
MCV: 86.8 fL (ref 78.0–100.0)
PLATELETS: 208 10*3/uL (ref 150–400)
RBC: 3.49 MIL/uL — AB (ref 4.22–5.81)
RDW: 17 % — ABNORMAL HIGH (ref 11.5–15.5)
WBC: 5.6 10*3/uL (ref 4.0–10.5)

## 2016-06-09 MED ORDER — CARVEDILOL 6.25 MG PO TABS: 6.2500 mg | ORAL_TABLET | Freq: Two times a day (BID) | ORAL | 11 refills | Status: DC

## 2016-06-09 MED ORDER — AMIODARONE HCL 200 MG PO TABS: 200.0000 mg | ORAL_TABLET | Freq: Two times a day (BID) | ORAL | 11 refills | Status: DC

## 2016-06-09 MED ORDER — APIXABAN 5 MG PO TABS: 5.0000 mg | ORAL_TABLET | Freq: Two times a day (BID) | ORAL | 0 refills | Status: DC

## 2016-06-09 MED ORDER — APIXABAN 5 MG PO TABS: 5.0000 mg | ORAL_TABLET | Freq: Two times a day (BID) | ORAL | 11 refills | Status: DC

## 2016-06-09 NOTE — Care Management Note (Signed)
Case Management Note  Patient Details  Name: Douglas Washington MRN: [id number] Date of Birth: 1929-06-27  Subjective/Objective:    Patient is from home with spouse, pta indep, he will be on eliquis , [facility] in [place] [address] has it in stock.  Patient has the 30 day savings card for the 30 days free.  He is for dc today.                  Action/Plan:   Expected Discharge Date:                  Expected Discharge Plan:  Home/Self Care  In-House Referral:     Discharge planning Services  CM Consult  Post Acute Care Choice:    Choice offered to:     DME Arranged:    DME Agency:     HH Arranged:    HH Agency:     Status of Service:  Completed, signed off  If discussed at [employer] of Stay Meetings, dates discussed:    Additional Comments:  Leone Haven, RN 06/09/2016, 6:04 PM

## 2016-06-09 NOTE — Progress Notes (Signed)
S/W [facility] @ [facility] # [phone]   1. ELIQUIS 5 MG BID (30 )   COVER-0 YES  CO-PAY- $80.00  60 TAB  TEIR - 3 DRUG  PRIOR APPROVAL - YES # [phone]  [facility]Y: [facility]

## 2016-06-09 NOTE — Progress Notes (Signed)
Patient ID: TANUSH DREES, male   DOB: August 19, 1929, 82 y.o.   MRN: [id number]    Patient Name: Douglas Washington Date of Encounter: 06/09/2016     Active Problems:   A-fib (HCC)   Chronic systolic heart failure (HCC)   VT (ventricular tachycardia) (HCC)    SUBJECTIVE Feels well, denies any kind of palpitations, no dizziness, near syncope or syncope.  He has ambulated in the hallway without symptoms, no CP, SOB.  CURRENT MEDS . amiodarone  200 mg Oral BID  . brimonidine  1 drop Left Eye BID  . budesonide (PULMICORT) nebulizer solution  0.25 mg Nebulization BID  . carvedilol  6.25 mg Oral BID WC  . dorzolamide  1 drop Both Eyes BID  . erythromycin  1 application Both Eyes QODAY  . latanoprost  1 drop Both Eyes QHS  . loratadine  10 mg Oral Daily  . montelukast  10 mg Oral QHS  . multivitamin with minerals  1 tablet Oral Daily  . prednisoLONE acetate  1 drop Left Eye QODAY  . sodium chloride flush  3 mL Intravenous Q12H  . sodium chloride flush  3 mL Intravenous Q12H  . timolol  1 drop Left Eye BID  . valACYclovir  500 mg Oral Daily    OBJECTIVE  Vitals:   06/08/16 2211 06/09/16 0520 06/09/16 0800 06/09/16 1331  BP:  (!) 109/59 118/70 (!) 111/56  Pulse:  61 60 (!) 57  Resp: 18 18  17   Temp:  98.3 F (36.8 C)  98.2 F (36.8 C)  TempSrc:  Oral  Oral  SpO2:  98%  98%  Weight:  213 lb 3.2 oz (96.7 kg)    Height:        Intake/Output Summary (Last 24 hours) at 06/09/16 1649 Last data filed at 06/09/16 1233  Gross per 24 hour  Intake              480 ml  Output             2150 ml  Net            -1670 ml   Filed Weights   06/07/16 0438 06/08/16 0528 06/09/16 0520  Weight: 218 lb 8 oz (99.1 kg) 217 lb 14.4 oz (98.8 kg) 213 lb 3.2 oz (96.7 kg)    PHYSICAL EXAM  General: Pleasant, elderly man, NAD. Neuro: Alert and oriented X 3. Moves all extremities spontaneously. Psych: Normal affect. HEENT:  Normal  Neck: Supple without bruits or JVD. Lungs:  Resp regular and  unlabored, CTA. Heart: RRR no s3, s4, or murmurs. Abdomen: Soft, non-tender, non-distended Extremities: No clubbing, cyanosis or edema. Accessory Clinical Findings  CBC  Recent Labs  06/08/16 0523 06/09/16 0333  WBC 4.4 5.6  HGB 9.1* 9.2*  HCT 29.7* 30.3*  MCV 86.6 86.8  PLT 194 208   Basic Metabolic Panel  Recent Labs  06/07/16 0608 06/08/16 0523  NA 139 138  K 4.3 3.8  CL 109 109  CO2 24 24  GLUCOSE 109* 119*  BUN 15 14  CREATININE 1.11 1.01  CALCIUM 9.3 9.1   TELE A paced, V sensing  Radiology/Studies Ct Abdomen Pelvis Wo Contrast Result Date: 06/08/2016 CLINICAL DATA:  Status post fall with bruising of the abdominal wall anemia. EXAM: CT ABDOMEN AND PELVIS WITHOUT CONTRAST TECHNIQUE: Multidetector CT imaging of the abdomen and pelvis was performed following the standard protocol without IV contrast. COMPARISON:  None. FINDINGS: Lower chest: Partially visualized dual  lead cardiac pacemaker. Moderate in size hiatal hernia. Hepatobiliary: No mass visualized on this un-enhanced exam. Pancreas: No mass or inflammatory process identified on this un-enhanced exam. Spleen: Within normal limits in size. Adrenals/Urinary Tract: Atrophic kidneys with bilateral perirenal fat stranding. Bilateral nonobstructive nephrolithiasis. Moderate left hydronephrosis and proximal hydroureter caused by obstructing proximal left ureteral calculi. There are multiple calculi within the proximal left ureter, just distal to the UPJ, the largest and distal most calculus measures 18 mm. Exophytic superior pole right renal cyst. Stomach/Bowel: No evidence of obstruction, inflammatory process, or abnormal fluid collections. Scattered colonic diverticular and redundant of the colon noted. There is a mid masslike thickening within the cecum at the ileocecal junction. Vascular/Lymphatic: No pathologically enlarged lymph nodes. No evidence of abdominal aortic aneurysm. Calcific atherosclerotic disease of the  aorta. Reproductive: Scattered calcifications within the prostate gland. Other: No evidence of retroperitoneal hematoma. Mild fat stranding within the right groin likely postprocedural. No focal fluid collection. Small periumbilical fat containing anterior abdominal wall hernia. Musculoskeletal: No suspicious bone lesions identified. Multilevel osteoarthritic changes of the lumbosacral spine with posterior facet arthropathy and minimal anterolisthesis of L4 on L5. Left total hip arthroplasty. No evidence of fractures. IMPRESSION: No evidence of retroperitoneal or groin hematoma. Moderate in size hiatal hernia. Multiple obstructing proximal left ureteral stones, the distal most and largest of which measures 18 mm, causing moderate left hydronephrosis and proximal hydroureter. 2.5 cm masslike thickening within the cecum, at the ileocecal valve. This may represent fecal material, prominent ileocecal valve or potentially a soft tissue mass. Correlation with colonoscopy may be considered. Fat containing periumbilical hernia. These results will be called to the ordering clinician or representative by the Radiologist Assistant, and communication documented in the PACS or zVision Dashboard. Electronically Signed   By: Ted Mcalpineobrinka  Dimitrova M.D.   On: 06/08/2016 13:48   Ct Head Wo Contrast Result Date: 06/06/2016 CLINICAL DATA:  Syncope headache EXAM: CT HEAD WITHOUT CONTRAST TECHNIQUE: Contiguous axial images were obtained from the base of the skull through the vertex without intravenous contrast. COMPARISON:  None. FINDINGS: Brain: Moderate atrophy. Mild chronic microvascular ischemic change in the white matter. Negative for acute infarct. Negative for hemorrhage or mass. Vascular: No hyperdense vessel or unexpected calcification. Skull: Right craniotomy.  No acute skull abnormality. Sinuses/Orbits: Near complete opacification of the right maxillary sinus. Expansion of the maxillary sinus with multiple calcifications  within the sinuses well as small gas bubbles. There is thinning of the medial wall of the maxillary sinus which is bowed inward. No definite mass extending lateral to the sinus or into the orbit. Mild mucosal edema in the frontal and ethmoid sinuses. Mastoid sinus clear bilaterally. Other: Negative IMPRESSION: Atrophy and chronic microvascular ischemic change. No acute intracranial abnormality Opacification of the right maxillary sinus with expansion and multiple calcific densities in the sinus. 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 Neoplasm not excluded. ENT evaluation suggested. Electronically Signed   By: Marlan Palauharles  Clark M.D.   On: 06/06/2016 21:42     ASSESSMENT AND PLAN  Discharge yesterday held secondary to negative CT to monitor H/H one more day.  H/H has remained stable, please refer to discharge summary written yesterday for hospitalization details, his ASA was decided to be discontinued, and planned follow up.  1. VT      he is tolerating amiodarone well. Continue 400 mg daily, until seen in f/u for re-assessment     He has not had further VT on telemetry  S/p cath with medical management     He has an appointment Thursday     The patient is aware of no driving for 6 months  2. Atrial fib      he has reverted to NSR after shock. He is on amiodarone. He was started on Eliquis     CHA2DS2Vasc is at least 3  3. Anemia      his hgb has dropped 2 grams over 2 days since his heart cath, though has remained stable  For 4 days.     No obvious GI bleeding, no reports of melena (he states he had a normal BM today)     No bleeding on CT scan post cath     He has f/u in 3 days with lab draw planned  4. Chronic diastolic heart failure -      his symptoms have improved nicely with lasix.   5. Disp.      The patient has been seen and examined by Dr. Ladona Ridgel and cleared for discharge today   The patient and wife report long hx of renal calculi and have a  urologist and will call for an appointment, they have an already planned labs/visit with his PMD in 3 weeks, will f/u on CT abdomen (stool vs mass) and maxillary findings as well.   Francis Dowse, PA-C 06/09/2016 4:49 PM   EP Attending  Patient seen and examined. Agree with above.   Leonia Reeves.D.

## 2016-06-09 NOTE — Discharge Instructions (Signed)
NO DRIVING FOR 6 MONTHS  **KEEP YOUR FOLLOW UP WITH YOUR PMD TO FOLLOW UP ON AND DISCUSS CAT SCAN FINDINGS HERE IN THE HOSPITAL.  No heavy or exertional activities until seen in follow up.      Information on my medicine - ELIQUIS (apixaban)  This medication education was reviewed with me or my healthcare representative as part of my discharge preparation. Why was Eliquis prescribed for you? Eliquis was prescribed for you to reduce the risk of a blood clot forming that can cause a stroke if you have a medical condition called atrial fibrillation (a type of irregular heartbeat).  What do You need to know about Eliquis ? Take your Eliquis TWICE DAILY - one tablet in the morning and one tablet in the evening with or without food. If you have difficulty swallowing the tablet whole please discuss with your pharmacist how to take the medication safely.  Take Eliquis exactly as prescribed by your doctor and DO NOT stop taking Eliquis without talking to the doctor who prescribed the medication.  Stopping may increase your risk of developing a stroke.  Refill your prescription before you run out.  After discharge, you should have regular check-up appointments with your healthcare provider that is prescribing your Eliquis.  In the future your dose may need to be changed if your kidney function or weight changes by a significant amount or as you get older.  What do you do if you miss a dose? If you miss a dose, take it as soon as you remember on the same day and resume taking twice daily.  Do not take more than one dose of ELIQUIS at the same time to make up a missed dose.  Important Safety Information A possible side effect of Eliquis is bleeding. You should call your healthcare provider right away if you experience any of the following: ? Bleeding from an injury or your nose that does not stop. ? Unusual colored urine (red or dark brown) or unusual colored stools (red or  black). ? Unusual bruising for unknown reasons. ? A serious fall or if you hit your head (even if there is no bleeding).  Some medicines may interact with Eliquis and might increase your risk of bleeding or clotting while on Eliquis. To help avoid this, consult your healthcare provider or pharmacist prior to using any new prescription or non-prescription medications, including herbals, vitamins, non-steroidal anti-inflammatory drugs (NSAIDs) and supplements.  This website has more information on Eliquis (apixaban): http://www.eliquis.com/eliquis/home

## 2016-06-09 NOTE — Care Management Important Message (Signed)
Important Message  Patient Details  Name: Douglas Washington MRN: [id number] Date of Birth: 19-Feb-1929   Medicare Important Message Given:  Yes    Kyla Balzarine 06/09/2016, 12:13 PM

## 2016-06-11 NOTE — Progress Notes (Signed)
Electrophysiology Office Note Date: 06/12/2016  ID:  Douglas Washington, DOB Jan 22, 1929, MRN [id number]  PCP: Rozanna Box, MD Primary Cardiologist: Laurena Bering Electrophysiologist: Allred  CC: hospitalization follow-up  Douglas Washington is a 82 y.o. male seen today for Dr Johney Frame.  He presents today for post hospital electrophysiology followup. He was admitted last week with syncope and VT requiring DCCV. Cardiac catheterization demonstrated diffuse CAD with no good targets for PCI and medical management was recommended.  He was started on amiodarone and presents today for post hospital follow up. Since discharge, the Douglas Washington reports doing reasonably well.  He has intermittent dizziness associated with standing.  He denies chest pain, palpitations, dyspnea, PND, orthopnea, nausea, vomiting, dizziness, syncope, edema, weight gain, or early satiety.  He has not had bleeding complications.   Device History: MDT dual chamber PPM implanted 2011 for sinus bradycardia   Past Medical History:  Diagnosis Date  . Allergy   . COPD (chronic obstructive pulmonary disease) (HCC)   . Hearing loss   . Hypertension   . OSA on CPAP   . Persistent atrial fibrillation (HCC)   . Sebaceous cyst    Upper back  . Sinus bradycardia    s/p MDT PPM   . Traumatic brain injury (HCC) 01/2005  . Ventricular tachycardia (HCC)    05/2016 requiring DCCV - started on amiodarone    Past Surgical History:  Procedure Laterality Date  . CARDIAC CATHETERIZATION N/A 06/06/2016   Procedure: Left Heart Cath and Coronary Angiography;  Surgeon: Kathleene Hazel, MD;  Location: [facility] INVASIVE CV LAB;  Service: Cardiovascular;  Laterality: N/A;  . CRANIOTOMY  03/2005  . PACEMAKER INSERTION  2011   MDT dual chamber PPM followed by [facility] implanted for sinus bradycardia  . SHOULDER ACROMIOPLASTY Bilateral   . TOTAL HIP ARTHROPLASTY Left 2005    Current Outpatient Prescriptions  Medication Sig Dispense Refill  . albuterol  (PROVENTIL HFA;VENTOLIN HFA) 108 (90 BASE) MCG/ACT inhaler Inhale 2 puffs into the lungs every 6 (six) hours as needed for wheezing or shortness of breath.    Marland Kitchen amiodarone (PACERONE) 200 MG tablet Take 1 tablet by mouth twice a day X's 2 weeks then go down to 1 tablet by mouth daily 60 tablet 11  . apixaban (ELIQUIS) 5 MG TABS tablet Take 1 tablet (5 mg total) by mouth 2 (two) times daily. 60 tablet 0  . brimonidine (ALPHAGAN) 0.2 % ophthalmic solution Place 1 drop into the left eye 2 (two) times daily.  5  . carvedilol (COREG) 3.125 MG tablet Take 1 tablet (3.125 mg total) by mouth 2 (two) times daily with a meal. 180 tablet 3  . cetirizine (ZYRTEC) 10 MG tablet Take 10 mg by mouth daily.    . dorzolamide (TRUSOPT) 2 % ophthalmic solution Place 1 drop into both eyes 2 (two) times daily.    Marland Kitchen erythromycin ophthalmic ointment Place 1 application into both eyes every other day. Every other night  3  . glucosamine-chondroitin 500-400 MG tablet Take 1 tablet by mouth daily.     Marland Kitchen latanoprost (XALATAN) 0.005 % ophthalmic solution Place 1 drop into both eyes at bedtime.    Marland Kitchen lisinopril (PRINIVIL,ZESTRIL) 5 MG tablet Take 5 mg by mouth daily.    Marland Kitchen MEGARED OMEGA-3 KRILL OIL 500 MG CAPS Take 500 mg by mouth daily.     . montelukast (SINGULAIR) 10 MG tablet Take 10 mg by mouth at bedtime.    . Multiple Vitamins-Minerals (MULTIVITAMIN WITH  MINERALS) tablet Take 1 tablet by mouth daily.    . Naproxen Sodium 220 MG CAPS Take 220 mg by mouth 2 (two) times daily.    . prednisoLONE acetate (PRED FORTE) 1 % ophthalmic suspension Place 1 drop into the left eye every other day.     Marland Kitchen QVAR 80 MCG/ACT inhaler Inhale 1 puff into the lungs 2 (two) times daily.     . saw palmetto 500 MG capsule Take 500 mg by mouth daily.    . timolol (TIMOPTIC) 0.5 % ophthalmic solution Place 1 drop into the left eye 2 (two) times daily.  4  . valACYclovir (VALTREX) 500 MG tablet Take 500 mg by mouth daily.     No current  facility-administered medications for this visit.     Allergies:   Penicillins; Adhesive [tape]; Ciprofloxacin; Oxycodone; Simvastatin; Statins; and Sulfa antibiotics   Social History: Social History   Social History  . Marital status: Married    Spouse name: N/A  . Number of children: N/A  . Years of education: N/A   Occupational History  . Retired    Social History Main Topics  . Smoking status: Former Smoker    Quit date: 04/14/1974  . Smokeless tobacco: Former Neurosurgeon    Quit date: 04/14/1974  . Alcohol use No  . Drug use: No  . Sexual activity: Not on file   Other Topics Concern  . Not on file   Social History Narrative  . No narrative on file    Family History: Family History  Problem Relation Age of Onset  . Diabetes Mother   . Heart disease Father   . Heart disease Brother      Review of Systems: All other systems reviewed and are otherwise negative except as noted above.   Physical Exam: VS:  BP 108/66   Pulse 72   Ht 5\' 10"  (1.778 m)   Wt 218 lb 12.8 oz (99.2 kg)   BMI 31.39 kg/m  , BMI Body mass index is 31.39 kg/m.  GEN- [name], alert and oriented x 3 today.   HEENT: normocephalic, atraumatic; sclera clear, conjunctiva pink; hearing intact; oropharynx clear; neck supple Lungs- Clear to ausculation bilaterally, normal work of breathing.  No wheezes, rales, rhonchi Heart- Regular rate and rhythm GI- soft, non-tender, non-distended, bowel sounds present Extremities- no clubbing, cyanosis, or edema; DP/PT/radial pulses 2+ bilaterally MS- no significant deformity or atrophy Skin- warm and dry, no rash or lesion; PPM pocket well healed Psych- euthymic mood, full affect Neuro- strength and sensation are intact  PPM Interrogation- reviewed in detail today,  See PACEART report  EKG:  EKG is ordered today. The ekg ordered today shows atrial pacing with intrinsic ventricular conduction  Recent Labs: 06/05/2016: B Natriuretic  Peptide 120.5; Magnesium 2.1; TSH 1.339 06/06/2016: ALT 79 06/08/2016: BUN 14; Creatinine, Ser 1.01; Potassium 3.8; Sodium 138 06/09/2016: Hemoglobin 9.2; Platelets 208   Wt Readings from Last 3 Encounters:  06/12/16 218 lb 12.8 oz (99.2 kg)  06/09/16 213 lb 3.2 oz (96.7 kg)  07/11/15 222 lb (100.7 kg)     Other studies Reviewed: Additional studies/ records that were reviewed today include: hospital records   Assessment and Plan:  1.  Ventricular tachycardia No recurrence on amiodarone Decrease dose to 200mg  daily in 2 weeks TSH normal recent hospitalization, recheck LFT's today No driving x6 months (pt aware)  2. Symptomatic bradycardia Normal PPM function See Pace Art report No changes today  3.  Persistent atrial fibrillation Converted to SR with DCCV for VT Started on Eliquis during hospitalization - previously not on OAC 2/2 SDH CHADS2VASC is 4 Discussed Watchman today as possible option.  He is very interested in pursuing. Will allow for a couple of more weeks to recover from acute admission and then tentatively plan for October.  Procedural risks discussed with Douglas Washington and family today.  CBC, BMET today   4.  HTN Stable No change required today  5.  CAD No targets for revascularization at recent cath Continue medical therapy  No ASA with OAC On BB - decrease dose to 3.125mg  twice daily with dizziness and hypotension Intolerant of statins    Current medicines are reviewed at length with the Douglas Washington today.   The Douglas Washington does not have concerns regarding his medicines.  The following changes were made today:  Decrease Coreg to 3.125mg  twice daily, decrease amiodarone to 200mg  daily in 2 weeks  Labs/ tests ordered today include: LFT's, CBC, BMET Orders Placed This Encounter  Procedures  . CBC  . Basic Metabolic Panel (BMET)  . Hepatic function panel  . EKG 12-Lead     Disposition:   Follow up with me or Dr Johney FrameAllred in 3-4 weeks.  He would like long term EP  follow up to be with Dr Graciela [place]lein [place].    Signed, Gypsy BalsamAmber Briseida Gittings, NP 06/12/2016 12:20 PM  [facility] [address] [place] [address] [phone] (office) [phone] (fax)

## 2016-06-12 ENCOUNTER — Encounter: Payer: Self-pay | Admitting: Internal Medicine

## 2016-06-12 ENCOUNTER — Ambulatory Visit (INDEPENDENT_AMBULATORY_CARE_PROVIDER_SITE_OTHER): Payer: Medicare Other | Admitting: Nurse Practitioner

## 2016-06-12 ENCOUNTER — Other Ambulatory Visit: Payer: Medicare Other

## 2016-06-12 ENCOUNTER — Encounter: Payer: Self-pay | Admitting: Nurse Practitioner

## 2016-06-12 VITALS — BP 108/66 | HR 72 | Ht 70.0 in | Wt 218.8 lb

## 2016-06-12 DIAGNOSIS — R001 Bradycardia, unspecified: Secondary | ICD-10-CM

## 2016-06-12 DIAGNOSIS — I4819 Other persistent atrial fibrillation: Secondary | ICD-10-CM

## 2016-06-12 DIAGNOSIS — I10 Essential (primary) hypertension: Secondary | ICD-10-CM | POA: Diagnosis not present

## 2016-06-12 DIAGNOSIS — I472 Ventricular tachycardia, unspecified: Secondary | ICD-10-CM

## 2016-06-12 DIAGNOSIS — I481 Persistent atrial fibrillation: Secondary | ICD-10-CM

## 2016-06-12 DIAGNOSIS — I2510 Atherosclerotic heart disease of native coronary artery without angina pectoris: Secondary | ICD-10-CM | POA: Diagnosis not present

## 2016-06-12 LAB — BASIC METABOLIC PANEL
BUN: 23 mg/dL (ref 7–25)
CALCIUM: 9.6 mg/dL (ref 8.6–10.3)
CO2: 23 mmol/L (ref 20–31)
Chloride: 104 mmol/L (ref 98–110)
Creat: 1.03 mg/dL (ref 0.70–1.11)
GLUCOSE: 90 mg/dL (ref 65–99)
Potassium: 5.2 mmol/L (ref 3.5–5.3)
SODIUM: 135 mmol/L (ref 135–146)

## 2016-06-12 LAB — HEPATIC FUNCTION PANEL
ALBUMIN: 3.3 g/dL — AB (ref 3.6–5.1)
ALK PHOS: 63 U/L (ref 40–115)
ALT: 25 U/L (ref 9–46)
AST: 16 U/L (ref 10–35)
Bilirubin, Direct: 0.1 mg/dL (ref <=0.2)
Indirect Bilirubin: 0.3 mg/dL (ref 0.2–1.2)
TOTAL PROTEIN: 6.4 g/dL (ref 6.1–8.1)
Total Bilirubin: 0.4 mg/dL (ref 0.2–1.2)

## 2016-06-12 LAB — CBC
HEMATOCRIT: 31.9 % — AB (ref 38.5–50.0)
HEMOGLOBIN: 10.1 g/dL — AB (ref 13.2–17.1)
MCH: 26.6 pg — AB (ref 27.0–33.0)
MCHC: 31.7 g/dL — ABNORMAL LOW (ref 32.0–36.0)
MCV: 84.2 fL (ref 80.0–100.0)
MPV: 9.5 fL (ref 7.5–12.5)
Platelets: 288 10*3/uL (ref 140–400)
RBC: 3.79 MIL/uL — AB (ref 4.20–5.80)
RDW: 16.9 % — ABNORMAL HIGH (ref 11.0–15.0)
WBC: 5.1 10*3/uL (ref 3.8–10.8)

## 2016-06-12 MED ORDER — CARVEDILOL 3.125 MG PO TABS: 3.1250 mg | ORAL_TABLET | Freq: Two times a day (BID) | ORAL | 3 refills | Status: DC

## 2016-06-12 MED ORDER — AMIODARONE HCL 200 MG PO TABS: ORAL_TABLET | ORAL | 11 refills | Status: DC

## 2016-06-12 NOTE — Patient Instructions (Addendum)
Medication Instructions:  Your physician has recommended you make the following change in your medication:  1.  DECREASE the Coreg 6.125 to 1/2 tablet (3.125 mg) twice a day 2.  CONTINUE the Amiodarone 200 mg taking 1 tablet twice a day for 2 more weeks then go down to 1 tablet daily   Labwork: TODAY:  CBC, BMET, & LFT  Testing/Procedures: None ordered  Follow-Up: Your physician recommends that you schedule a follow-up appointment in: 3 WEEKS WITH AMBER SEILER, NP OR DR. ALLRED   Any Other Special Instructions Will Be Listed Below (If Applicable).    If you need a refill on your cardiac medications before your next appointment, please call your pharmacy.

## 2016-06-13 ENCOUNTER — Other Ambulatory Visit: Payer: Medicare Other

## 2016-06-13 LAB — CUP PACEART INCLINIC DEVICE CHECK
Date Time Interrogation Session: 20170901081048
Implantable Lead Implant Date: 20110318
Implantable Lead Model: 5076
Implantable Lead Model: 5076
MDC IDC LEAD IMPLANT DT: 20110318
MDC IDC LEAD LOCATION: 753859
MDC IDC LEAD LOCATION: 753860

## 2016-06-27 ENCOUNTER — Encounter: Payer: Self-pay | Admitting: Nurse Practitioner

## 2016-07-09 NOTE — Progress Notes (Signed)
Electrophysiology Office Note Date: 07/10/2016  ID:  Douglas Washington, DOB June 16, 1929, MRN [id number]  PCP: Rozanna BoxBABAOFF, MARC E, MD Primary Cardiologist: Laurena BeringKowalksi Electrophysiologist: Allred  CC: VT follow-up  Douglas Washington is a 82 y.o. male seen today for Dr Johney FrameAllred.  He presents today for routine electrophysiology followup. Since last being seen in clinic, the patient reports doing reasonably well. His orthostatic intolerance has improved with lower dose BB.  He has been found to have large kidney stone causing obstruction and is in need of surgical clearance for lithotripsy. He is walking twice daily without chest pain or shortness of breath.  He denies chest pain, palpitations, dyspnea, PND, orthopnea, nausea, vomiting, dizziness, syncope, edema, weight gain, or early satiety.    Device History: MDT dual chamber PPM implanted 2011 for sinus bradycardia   Past Medical History:  Diagnosis Date  . Allergy   . COPD (chronic obstructive pulmonary disease) (HCC)   . Hearing loss   . Hypertension   . OSA on CPAP   . Persistent atrial fibrillation (HCC)   . Sebaceous cyst    Upper back  . Sinus bradycardia    s/p MDT PPM   . Traumatic brain injury (HCC) 01/2005  . Ventricular tachycardia (HCC)    05/2016 requiring DCCV - started on amiodarone    Past Surgical History:  Procedure Laterality Date  . CARDIAC CATHETERIZATION N/A 06/06/2016   Procedure: Left Heart Cath and Coronary Angiography;  Surgeon: Kathleene Hazelhristopher D McAlhany, MD;  [facility];  Service: Cardiovascular;  Laterality: N/A;  . CRANIOTOMY  03/2005  . PACEMAKER INSERTION  2011   MDT dual chamber PPM followed by [facility] implanted for sinus bradycardia  . SHOULDER ACROMIOPLASTY Bilateral   . TOTAL HIP ARTHROPLASTY Left 2005    Current Outpatient Prescriptions  Medication Sig Dispense Refill  . albuterol (PROVENTIL HFA;VENTOLIN HFA) 108 (90 BASE) MCG/ACT inhaler Inhale 2 puffs into the lungs every 6 (six) hours as  needed for wheezing or shortness of breath.    Marland Kitchen. amiodarone (PACERONE) 200 MG tablet Take 1 tablet by mouth twice a day X's 2 weeks then go down to 1 tablet by mouth daily 60 tablet 11  . apixaban (ELIQUIS) 5 MG TABS tablet Take 1 tablet (5 mg total) by mouth 2 (two) times daily. 60 tablet 0  . brimonidine (ALPHAGAN) 0.2 % ophthalmic solution Place 1 drop into the left eye 2 (two) times daily.  5  . carvedilol (COREG) 3.125 MG tablet Take 1 tablet (3.125 mg total) by mouth 2 (two) times daily with a meal. 180 tablet 3  . cetirizine (ZYRTEC) 10 MG tablet Take 10 mg by mouth daily.    . dorzolamide (TRUSOPT) 2 % ophthalmic solution Place 1 drop into both eyes 2 (two) times daily.    Marland Kitchen. erythromycin ophthalmic ointment Place 1 application into both eyes every other day. Every other night  3  . latanoprost (XALATAN) 0.005 % ophthalmic solution Place 1 drop into both eyes at bedtime.    Marland Kitchen. lisinopril (PRINIVIL,ZESTRIL) 5 MG tablet Take 5 mg by mouth daily.    Marland Kitchen. MEGARED OMEGA-3 KRILL OIL 500 MG CAPS Take 500 mg by mouth daily.     . montelukast (SINGULAIR) 10 MG tablet Take 10 mg by mouth at bedtime.    . Multiple Vitamins-Minerals (MULTIVITAMIN WITH MINERALS) tablet Take 1 tablet by mouth daily.    . prednisoLONE acetate (PRED FORTE) 1 % ophthalmic suspension Place 1 drop into the  left eye every other day.     . saw palmetto 500 MG capsule Take 500 mg by mouth daily.    . timolol (TIMOPTIC) 0.5 % ophthalmic solution Place 1 drop into the left eye 2 (two) times daily.  4  . valACYclovir (VALTREX) 500 MG tablet Take 500 mg by mouth daily.     No current facility-administered medications for this visit.     Allergies:   Penicillins; Adhesive [tape]; Ciprofloxacin; Oxycodone; Simvastatin; Statins; and Sulfa antibiotics   Social History: Social History   Social History  . Marital status: Married    Spouse name: N/A  . Number of children: N/A  . Years of education: N/A   Occupational History  .  Retired    Social History Main Topics  . Smoking status: Former Smoker    Quit date: 04/14/1974  . Smokeless tobacco: Former Neurosurgeon    Quit date: 04/14/1974  . Alcohol use No  . Drug use: No  . Sexual activity: Not on file   Other Topics Concern  . Not on file   Social History Narrative  . No narrative on file    Family History: Family History  Problem Relation Age of Onset  . Diabetes Mother   . Heart disease Father   . Heart disease Brother      Review of Systems: All other systems reviewed and are otherwise negative except as noted above.   Physical Exam: VS:  BP 108/70   Pulse 79   Ht 5\' 10"  (1.778 m)   Wt 213 lb 6.4 oz (96.8 kg)   SpO2 98%   BMI 30.62 kg/m  , BMI Body mass index is 30.62 kg/m.  GEN- The patient is elderly appearing, alert and oriented x 3 today.   HEENT: normocephalic, atraumatic; sclera clear, conjunctiva pink; hearing intact; oropharynx clear; neck supple Lungs- Clear to ausculation bilaterally, normal work of breathing.  No wheezes, rales, rhonchi Heart- Regular rate and rhythm GI- soft, non-tender, non-distended, bowel sounds present Extremities- no clubbing, cyanosis, or edema; DP/PT/radial pulses 2+ bilaterally MS- no significant deformity or atrophy Skin- warm and dry, no rash or lesion; PPM pocket well healed Psych- euthymic mood, full affect Neuro- strength and sensation are intact  PPM Interrogation- reviewed in detail today,  See PACEART report  EKG:  EKG is not ordered today.  Recent Labs: 06/05/2016: B Natriuretic Peptide 120.5; Magnesium 2.1; TSH 1.339 06/12/2016: ALT 25; BUN 23; Creat 1.03; Hemoglobin 10.1; Platelets 288; Potassium 5.2; Sodium 135   Wt Readings from Last 3 Encounters:  07/10/16 213 lb 6.4 oz (96.8 kg)  06/12/16 218 lb 12.8 oz (99.2 kg)  06/09/16 213 lb 3.2 oz (96.7 kg)     Other studies Reviewed: Additional studies/ records that were reviewed today include: hospital records   Assessment and Plan:  1.   Ventricular tachycardia No recurrence on amiodarone Continue amiodarone 200mg  daily   TSH, LFT's recently normal No driving x6 months (pt aware)  2. Symptomatic bradycardia Normal PPM function See Pace Art report No changes today  3.  Persistent atrial fibrillation Converted to SR with DCCV for VT Started on Eliquis during hospitalization - previously not on OAC 2/2 SDH CHADS2VASC is 4 Will schedule Watchman screening TEE and tentatively plan for 10-19.   4.  HTN Stable No change required today  5.  CAD No targets for revascularization at recent cath Continue medical therapy  No ASA with OAC On BB - decrease dose to 3.125mg  twice daily with  dizziness and hypotension Intolerant of statins   6. Surgical clearance The patient is at moderate risk for lithotripsy with anesthesia. He had recent cath with no disease amenable to PCI.  Would continue BB in the peri-operative period. He is now 4 weeks post cardioversion to SR.  If necessary to hold Eliquis for procedure, ok to do so, but [facility]. Clearance letter faxed today.    Current medicines are reviewed at length with the patient today.   The patient does not have concerns regarding his medicines.  The following changes were made today:  none  Labs/ tests ordered today include: TEE for Watchman screening No orders of the defined types were placed in this encounter.    Disposition:   Follow up with me by phone following TEE   Signed, Gypsy BalsamAmber Azlin Zilberman, NP 07/10/2016 10:25 AM  [facility] [address] [place] [address] [phone] (office) [phone] (fax)

## 2016-07-10 ENCOUNTER — Encounter: Payer: Self-pay | Admitting: Nurse Practitioner

## 2016-07-10 ENCOUNTER — Ambulatory Visit (INDEPENDENT_AMBULATORY_CARE_PROVIDER_SITE_OTHER): Payer: Medicare Other | Admitting: Nurse Practitioner

## 2016-07-10 ENCOUNTER — Encounter: Payer: Self-pay | Admitting: Internal Medicine

## 2016-07-10 VITALS — BP 108/70 | HR 79 | Ht 70.0 in | Wt 213.4 lb

## 2016-07-10 DIAGNOSIS — R001 Bradycardia, unspecified: Secondary | ICD-10-CM

## 2016-07-10 DIAGNOSIS — I10 Essential (primary) hypertension: Secondary | ICD-10-CM

## 2016-07-10 DIAGNOSIS — I481 Persistent atrial fibrillation: Secondary | ICD-10-CM

## 2016-07-10 DIAGNOSIS — I472 Ventricular tachycardia, unspecified: Secondary | ICD-10-CM

## 2016-07-10 DIAGNOSIS — Z01818 Encounter for other preprocedural examination: Secondary | ICD-10-CM

## 2016-07-10 DIAGNOSIS — I4819 Other persistent atrial fibrillation: Secondary | ICD-10-CM

## 2016-07-10 MED ORDER — CARVEDILOL 3.125 MG PO TABS: 3.1250 mg | ORAL_TABLET | Freq: Two times a day (BID) | ORAL | 3 refills | Status: AC

## 2016-07-10 NOTE — Patient Instructions (Addendum)
Medication Instructions:  Your physician recommends that you continue on your current medications as directed. Please refer to the Current Medication list given to you today.   Labwork: None ordered   Testing/Procedures: Your physician has requested that you have a TEE. During a TEE, sound waves are used to create images of your heart. It provides your doctor with information about the size and shape of your heart and how well your heart's chambers and valves are working. In this test, a transducer is attached to the end of a flexible tube that's guided down your throat and into your esophagus (the tube leading from you mouth to your stomach) to get a more detailed image of your heart. You are not awake for the procedure. Please see the instruction sheet given to you today. For further information please visit [email or web address]   Please arrive at [facility] Entrance of [facility] at 10:00am on 07/21/16 Do not eat or drink after midnight the night prior to the procedure Okay to take medications the morning of the procedure with aq small sip of water Will need someone to drive you home after the procedure    Follow-Up: Your physician recommends that you schedule a follow-up appointment----Amber Glory Buff, NP will call you after the procedure to discuss follow up   Okay to proceed with Lithotripsy

## 2016-07-10 NOTE — H&P (Deleted)
NAMEKEIL, CORAZZA NO.:  [id number]  MEDICAL RECORD NO.:  [id number]  LOCATION:  [id number]                        FACILITY:  [facility]  PHYSICIAN:  Anola Gurney          DATE OF BIRTH:  January 07, 1929  DATE OF ADMISSION:  06/05/2016 DATE OF DISCHARGE:  06/09/2016                            HISTORY AND PHYSICAL   Same-day surgery, October 3.  CHIEF COMPLAINT:  Left flank pain.  HISTORY OF PRESENT ILLNESS:  Mr. Serratore is an 82 year old, Caucasian male, who fell down with bruising and was noted to have bruising of his abdominal wall.  At the end of August.  He had a CT scan done at that time, and was noted to have multiple large renal stones with left hydronephrosis.  Follow up in the office on September 27 indicated that he had 2 large stones in the proximal left ureter measuring 10 x 50 mm in aggregate as well as 2 large lower pole stones on the left side measuring 5 x 7 mm and 10 x 10 mm.  The patient comes in now for left ureteroscopic ureterolithotomy with holmium laser lithotripsy.  The patient has been cleared by his cardiologist for this procedure.  PAST MEDICAL HISTORY:  The patient was allergic to penicillin, sulfa, oxycodone Zocor and simvastatin.  CURRENT MEDICATIONS:  Included Eliquis, Coreg, amiodarone, glucosamine, omega-3, Krill oil, multivitamins, ProAir inhaler, lisinopril, Peenuts supplement, saw palmetto, Zyrtec, erythromycin eye ointment, timolol ophthalmic solution, latanoprost eye drops, brimonidine ophthalmic solution, dorzolamide ophthalmic solution, prednisolone ophthalmic solution, montelukast, ultimate pro-support vitamin, valacyclovir and tamsulosin.  SURGICAL HISTORY: 1. Left ureteroscopic ureterolithotomy in 1996. 2. Lithotripsy, 2001. 3. Left hip replacement, 2005. 4. 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 5. Right shoulder replacement, 2009. 6. TUMT in 2009. 7. Left shoulder replaced in 2010. 8. Pacemaker placed in 2011. 9.  TUMT in march, 2017. 10.Removal of upper back sebaceous cyst in 2016.  SOCIAL HISTORY:  Patient denied tobacco or alcohol use.  FAMILY HISTORY:  Mother died at age 51 with diabetes.  Father died age 28 from heart disease.  PAST AND CURRENT MEDICAL CONDITIONS: 1. Cardiac arrhythmias status post pacemaker placement. 2. Hypertension. 3. COPD. 4. Sleep apnea. 5. Glaucoma. 6. Hearing loss. 7. BPH.  REVIEW OF SYSTEMS:  The patient denied chest pain, or shortness of breath.  He denies stroke.  He has joint stiffness and pain.  His has weakness and fatigue.  He does have decreased auditory ability.  He has decreased visual acuity.  PHYSICAL EXAMINATION:  GENERAL:  Elderly white male, in no acute distress. HEENT:  Sclerae clear. NECK:  Supple.  No palpable adenopathy. LUNGS:  Clear to auscultation. CARDIOVASCULAR:  Irregular rhythm without audible murmurs. ABDOMEN:  Soft, nontender abdomen. GU AND RECTAL:  Deferred. NEUROMUSCULAR:  Alert and oriented x3.  IMPRESSION: 1. Large obstructing left proximal ureteral stone 2 stones. 2. Left nephrolithiasis. 3. Hydronephrosis.  PLAN:  Left ureteroscopic ureterolithotomy with holmium laser lithotripsy, and stent placement.          ______________________________ Anola Gurney     MW/MEDQ  D:  07/10/2016  T:  07/10/2016  Job:  [id number]

## 2016-07-11 ENCOUNTER — Encounter
Admission: RE | Admit: 2016-07-11 | Discharge: 2016-07-11 | Disposition: A | Discharge status: Home / Self Care | Payer: Medicare Other | Source: Ambulatory Visit | Attending: Orthopedic Surgery | Admitting: Orthopedic Surgery

## 2016-07-11 DIAGNOSIS — H409 Unspecified glaucoma: Secondary | ICD-10-CM | POA: Insufficient documentation

## 2016-07-11 DIAGNOSIS — J449 Chronic obstructive pulmonary disease, unspecified: Secondary | ICD-10-CM | POA: Insufficient documentation

## 2016-07-11 DIAGNOSIS — N200 Calculus of kidney: Secondary | ICD-10-CM | POA: Insufficient documentation

## 2016-07-11 DIAGNOSIS — I10 Essential (primary) hypertension: Secondary | ICD-10-CM | POA: Diagnosis not present

## 2016-07-11 DIAGNOSIS — G4733 Obstructive sleep apnea (adult) (pediatric): Secondary | ICD-10-CM | POA: Diagnosis not present

## 2016-07-11 DIAGNOSIS — Z950 Presence of cardiac pacemaker: Secondary | ICD-10-CM | POA: Insufficient documentation

## 2016-07-11 DIAGNOSIS — N400 Enlarged prostate without lower urinary tract symptoms: Secondary | ICD-10-CM | POA: Insufficient documentation

## 2016-07-11 HISTORY — DX: Personal history of other infectious and parasitic diseases: Z86.19

## 2016-07-11 HISTORY — DX: Presence of cardiac pacemaker: Z95.0

## 2016-07-11 HISTORY — DX: Unspecified glaucoma: H40.9

## 2016-07-11 HISTORY — DX: Reserved for inherently not codable concepts without codable children: IMO0001

## 2016-07-11 HISTORY — DX: Chronic kidney disease, unspecified: N18.9

## 2016-07-11 NOTE — Patient Instructions (Signed)
  Your procedure is scheduled on: July 15, 2016 (Tuesday) Report to Same Day Surgery 2nd floor Medical Mall To find out your arrival time please call [phone] between 1PM - 3PM on July 14, 2016 (Monday)  Remember: Instructions that are not followed completely may result in serious medical risk, up to and including death, or upon the discretion of your surgeon and anesthesiologist your surgery may need to be rescheduled.    _x___ 1. Do not eat food or drink liquids after midnight. No gum chewing or hard candies.     _x__ 2. No Alcohol for 24 hours before or after surgery.   _x_ __3. No Smoking for 24 prior to surgery.   ____  4. Bring all medications with you on the day of surgery if instructed.    __x__ 5. Notify your doctor if there is any change in your medical condition     (cold, fever, infections).     Do not wear jewelry, make-up, hairpins, clips or nail polish.  Do not wear lotions, powders, or perfumes. You may wear deodorant.  Do not shave 48 hours prior to surgery. Men may shave face and neck.  Do not bring valuables to the hospital.    [facility] is not responsible for any belongings or valuables.               Contacts, dentures or bridgework may not be worn into surgery.  Leave your suitcase in the car. After surgery it may be brought to your room.  For patients admitted to the hospital, discharge time is determined by your treatment team.   Patients discharged the day of surgery will not be allowed to drive home.    Please read over the following fact sheets that you were given:   [facility] Preparing for Surgery and or MRSA Information   _x___ Take these medicines the morning of surgery with A SIP OF WATER:    1. Lisinopril  2.Carvedilol  3.  4.  5.  6.  ____Fleets enema or Magnesium Citrate as directed.   ___ Use CHG Soap or sage wipes as directed on instruction sheet   __x__ Use inhalers on the day of surgery and bring to hospital day of  surgery (Use Pro Air inhaler the morning of surgery and bring inhaler to hospital)  ____ Stop metformin 2 days prior to surgery    ____ Take 1/2 of usual insulin dose the night before surgery and none on the morning of           surgery.   _x___ Stop aspirin or coumadin, or plavix (STOP Eliquis on October 1st as instructed by doctor)   x__ Stop Anti-inflammatories such as Advil, Aleve, Ibuprofen, Motrin, Naproxen,          Naprosyn, Goodies powders or aspirin products. Ok to take Tylenol.   __x__ Stop supplements until after surgery. (STOP Glucosamine, Omega-3 Krill Oil, Saw Palmetto, and Ultimate Pro Support NOW)   __x__ Bring C-Pap to the hospital.

## 2016-07-14 ENCOUNTER — Other Ambulatory Visit (HOSPITAL_COMMUNITY): Payer: Self-pay | Admitting: Nurse Practitioner

## 2016-07-15 ENCOUNTER — Ambulatory Visit: Payer: Medicare Other | Admitting: Certified Registered"

## 2016-07-15 ENCOUNTER — Encounter: Payer: Self-pay | Admitting: *Deleted

## 2016-07-15 ENCOUNTER — Encounter
Admission: RE | Disposition: A | Discharge status: Home / Self Care | Payer: Self-pay | Source: Ambulatory Visit | Attending: Urology

## 2016-07-15 ENCOUNTER — Ambulatory Visit
Admission: RE | Admit: 2016-07-15 | Discharge: 2016-07-15 | Disposition: A | Discharge status: Home / Self Care | Payer: Medicare Other | Source: Ambulatory Visit | Attending: Urology | Admitting: Urology

## 2016-07-15 DIAGNOSIS — Z96611 Presence of right artificial shoulder joint: Secondary | ICD-10-CM | POA: Diagnosis not present

## 2016-07-15 DIAGNOSIS — Z79899 Other long term (current) drug therapy: Secondary | ICD-10-CM | POA: Insufficient documentation

## 2016-07-15 DIAGNOSIS — R109 Unspecified abdominal pain: Secondary | ICD-10-CM | POA: Diagnosis present

## 2016-07-15 DIAGNOSIS — I509 Heart failure, unspecified: Secondary | ICD-10-CM | POA: Diagnosis not present

## 2016-07-15 DIAGNOSIS — N132 Hydronephrosis with renal and ureteral calculous obstruction: Secondary | ICD-10-CM | POA: Diagnosis not present

## 2016-07-15 DIAGNOSIS — J449 Chronic obstructive pulmonary disease, unspecified: Secondary | ICD-10-CM | POA: Diagnosis not present

## 2016-07-15 DIAGNOSIS — I110 Hypertensive heart disease with heart failure: Secondary | ICD-10-CM | POA: Insufficient documentation

## 2016-07-15 DIAGNOSIS — Z7901 Long term (current) use of anticoagulants: Secondary | ICD-10-CM | POA: Diagnosis not present

## 2016-07-15 DIAGNOSIS — Z87891 Personal history of nicotine dependence: Secondary | ICD-10-CM | POA: Insufficient documentation

## 2016-07-15 DIAGNOSIS — I481 Persistent atrial fibrillation: Secondary | ICD-10-CM | POA: Diagnosis not present

## 2016-07-15 DIAGNOSIS — G4730 Sleep apnea, unspecified: Secondary | ICD-10-CM | POA: Insufficient documentation

## 2016-07-15 DIAGNOSIS — Z96642 Presence of left artificial hip joint: Secondary | ICD-10-CM | POA: Insufficient documentation

## 2016-07-15 DIAGNOSIS — Z96612 Presence of left artificial shoulder joint: Secondary | ICD-10-CM | POA: Diagnosis not present

## 2016-07-15 DIAGNOSIS — H409 Unspecified glaucoma: Secondary | ICD-10-CM | POA: Insufficient documentation

## 2016-07-15 DIAGNOSIS — Z950 Presence of cardiac pacemaker: Secondary | ICD-10-CM | POA: Diagnosis not present

## 2016-07-15 HISTORY — PX: CYSTOSCOPY WITH STENT PLACEMENT: SHX5790

## 2016-07-15 HISTORY — PX: URETEROSCOPY WITH HOLMIUM LASER LITHOTRIPSY: SHX6645

## 2016-07-15 SURGERY — URETEROSCOPY, WITH LITHOTRIPSY USING HOLMIUM LASER
Anesthesia: General | Laterality: Left

## 2016-07-15 MED ORDER — PROPOFOL 10 MG/ML IV BOLUS
INTRAVENOUS | Status: DC | PRN
  Administered 2016-07-15: 130 mg via INTRAVENOUS

## 2016-07-15 MED ORDER — LACTATED RINGERS IV SOLN
INTRAVENOUS | Status: DC
  Administered 2016-07-15 (×2): via INTRAVENOUS

## 2016-07-15 MED ORDER — FENTANYL CITRATE (PF) 100 MCG/2ML IJ SOLN: 25.0000 ug | INTRAMUSCULAR | Status: DC | PRN

## 2016-07-15 MED ORDER — GENTAMICIN IN SALINE 1.6-0.9 MG/ML-% IV SOLN
80.0000 mg | Freq: Once | INTRAVENOUS | Status: AC
  Administered 2016-07-15: 80 mg via INTRAVENOUS
  Filled 2016-07-15: qty 50

## 2016-07-15 MED ORDER — LIDOCAINE HCL 2 % EX GEL
CUTANEOUS | Status: AC
  Filled 2016-07-15: qty 10

## 2016-07-15 MED ORDER — ONDANSETRON HCL 4 MG/2ML IJ SOLN
4.0000 mg | Freq: Once | INTRAMUSCULAR | Status: DC | PRN
Start: 2016-07-15 — End: 2016-07-15

## 2016-07-15 MED ORDER — ROCURONIUM BROMIDE 100 MG/10ML IV SOLN
INTRAVENOUS | Status: DC | PRN
  Administered 2016-07-15: 30 mg via INTRAVENOUS
  Administered 2016-07-15: 20 mg via INTRAVENOUS

## 2016-07-15 MED ORDER — BELLADONNA ALKALOIDS-OPIUM 16.2-60 MG RE SUPP
RECTAL | Status: AC
  Filled 2016-07-15: qty 1

## 2016-07-15 MED ORDER — NUCYNTA 50 MG PO TABS: 50.0000 mg | ORAL_TABLET | Freq: Four times a day (QID) | ORAL | 0 refills | Status: DC | PRN

## 2016-07-15 MED ORDER — UROGESIC-BLUE 81.6 MG PO TABS: 1.0000 | ORAL_TABLET | Freq: Four times a day (QID) | ORAL | 3 refills | Status: DC | PRN

## 2016-07-15 MED ORDER — ONDANSETRON 8 MG PO TBDP: 8.0000 mg | ORAL_TABLET | Freq: Four times a day (QID) | ORAL | 3 refills | Status: DC | PRN

## 2016-07-15 MED ORDER — LIDOCAINE HCL (CARDIAC) 20 MG/ML IV SOLN
INTRAVENOUS | Status: DC | PRN
  Administered 2016-07-15: 100 mg via INTRAVENOUS

## 2016-07-15 MED ORDER — DOCUSATE SODIUM 100 MG PO CAPS: 200.0000 mg | ORAL_CAPSULE | Freq: Two times a day (BID) | ORAL | 3 refills | Status: DC

## 2016-07-15 MED ORDER — FAMOTIDINE 20 MG PO TABS
20.0000 mg | ORAL_TABLET | Freq: Once | ORAL | Status: AC
  Administered 2016-07-15: 20 mg via ORAL

## 2016-07-15 MED ORDER — LIDOCAINE HCL 2 % EX GEL
CUTANEOUS | Status: DC | PRN
  Administered 2016-07-15: 1 via URETHRAL

## 2016-07-15 MED ORDER — BELLADONNA ALKALOIDS-OPIUM 16.2-60 MG RE SUPP
RECTAL | Status: DC | PRN
  Administered 2016-07-15: 1 via RECTAL

## 2016-07-15 MED ORDER — FAMOTIDINE 20 MG PO TABS
ORAL_TABLET | ORAL | Status: AC
  Filled 2016-07-15: qty 1

## 2016-07-15 MED ORDER — FENTANYL CITRATE (PF) 100 MCG/2ML IJ SOLN
INTRAMUSCULAR | Status: DC | PRN
  Administered 2016-07-15 (×2): 25 ug via INTRAVENOUS

## 2016-07-15 MED ORDER — SUGAMMADEX SODIUM 200 MG/2ML IV SOLN
INTRAVENOUS | Status: DC | PRN
  Administered 2016-07-15: 180 mg via INTRAVENOUS

## 2016-07-15 MED ORDER — ONDANSETRON HCL 4 MG/2ML IJ SOLN
INTRAMUSCULAR | Status: DC | PRN
  Administered 2016-07-15: 4 mg via INTRAVENOUS

## 2016-07-15 MED ORDER — PHENYLEPHRINE HCL 10 MG/ML IJ SOLN
INTRAMUSCULAR | Status: DC | PRN
  Administered 2016-07-15: 100 ug via INTRAVENOUS
  Administered 2016-07-15 (×2): 200 ug via INTRAVENOUS

## 2016-07-15 SURGICAL SUPPLY — 36 items
BAG DRAIN CYSTO-URO LG1000N (MISCELLANEOUS) ×2 IMPLANT
BALLN URETL DIL 7X4 (MISCELLANEOUS) ×2
BALLOON URETL DIL 7X4 (MISCELLANEOUS) ×1 IMPLANT
CATH URETL 5X70 OPEN END (CATHETERS) ×2 IMPLANT
CNTNR SPEC 2.5X3XGRAD LEK (MISCELLANEOUS) ×1
CONRAY 43 FOR UROLOGY 50M (MISCELLANEOUS) ×2 IMPLANT
CONT SPEC 4OZ STER OR WHT (MISCELLANEOUS) ×1
CONTAINER SPEC 2.5X3XGRAD LEK (MISCELLANEOUS) ×1 IMPLANT
FEE TECHNICIAN ONLY PER HOUR (MISCELLANEOUS) IMPLANT
FIBER LASER 550 (Laser) IMPLANT
FIBER LASER LITHO 273 (Laser) ×2 IMPLANT
GLOVE BIO SURGEON STRL SZ7 (GLOVE) ×4 IMPLANT
GLOVE BIO SURGEON STRL SZ7.5 (GLOVE) ×2 IMPLANT
GOWN STRL REUS W/ TWL LRG LVL4 (GOWN DISPOSABLE) ×1 IMPLANT
GOWN STRL REUS W/ TWL XL LVL3 (GOWN DISPOSABLE) ×1 IMPLANT
GOWN STRL REUS W/TWL LRG LVL4 (GOWN DISPOSABLE) ×1
GOWN STRL REUS W/TWL XL LVL3 (GOWN DISPOSABLE) ×1
GOWN STRL REUS W/TWL XL LVL4 (GOWN DISPOSABLE) ×2 IMPLANT
GUIDEWIRE STR ZIPWIRE 035X150 (MISCELLANEOUS) ×2 IMPLANT
KIT RM TURNOVER CYSTO AR (KITS) ×2 IMPLANT
LASER HOLMIUM FIBER SU 272UM (MISCELLANEOUS) IMPLANT
LASER HOLMIUM STANDBY (MISCELLANEOUS) IMPLANT
LASER HOLMIUM SU 940UM (MISCELLANEOUS) IMPLANT
PACK CYSTO AR (MISCELLANEOUS) ×2 IMPLANT
PREP PVP WINGED SPONGE (MISCELLANEOUS) ×2 IMPLANT
SET CYSTO W/LG BORE CLAMP LF (SET/KITS/TRAYS/PACK) ×2 IMPLANT
SOL .9 NS 3000ML IRR  AL (IV SOLUTION) ×1
SOL .9 NS 3000ML IRR UROMATIC (IV SOLUTION) ×1 IMPLANT
SOL PREP PVP 2OZ (MISCELLANEOUS) ×2
SOLUTION PREP PVP 2OZ (MISCELLANEOUS) ×1 IMPLANT
STENT URET 6FRX24 CONTOUR (STENTS) IMPLANT
STENT URET 6FRX26 CONTOUR (STENTS) IMPLANT
STENT URET 6FRX28 CONTOUR (STENTS) ×2 IMPLANT
SURGILUBE 2OZ TUBE FLIPTOP (MISCELLANEOUS) ×2 IMPLANT
SYRINGE 10CC LL (SYRINGE) ×2 IMPLANT
WATER STERILE IRR 1000ML POUR (IV SOLUTION) ×2 IMPLANT

## 2016-07-15 NOTE — Anesthesia Preprocedure Evaluation (Signed)
Anesthesia Evaluation  Patient identified by MRN, date of birth, ID band Patient awake    Reviewed: Allergy & Precautions, H&P , NPO status , Patient's Chart, lab work & pertinent test results, reviewed documented beta blocker date and time   History of Anesthesia Complications Negative for: history of anesthetic complications  Airway Mallampati: IV  TM Distance: >3 FB Neck ROM: full    Dental no notable dental hx. (+) Partial Lower, Upper Dentures   Pulmonary shortness of breath and with exertion, sleep apnea and Continuous Positive Airway Pressure Ventilation , COPD,  COPD inhaler, neg recent URI, former smoker,    Pulmonary exam normal breath sounds clear to auscultation       Cardiovascular Exercise Tolerance: Good hypertension, (-) angina+CHF (In August, resolved)  (-) CAD, (-) Past MI, (-) Cardiac Stents and (-) CABG Normal cardiovascular exam+ dysrhythmias Atrial Fibrillation + pacemaker + Valvular Problems/Murmurs  Rhythm:regular Rate:Normal     Neuro/Psych neg Seizures TBI in 2006 negative psych ROS   GI/Hepatic negative GI ROS, Neg liver ROS,   Endo/Other  negative endocrine ROS  Renal/GU CRFRenal disease (kidney stones)  negative genitourinary   Musculoskeletal   Abdominal   Peds  Hematology negative hematology ROS (+)   Anesthesia Other Findings Past Medical History: No date: Allergy No date: Chronic kidney disease     Comment: Kidney stones No date: COPD (chronic obstructive pulmonary disease) (* No date: Glaucoma (increased eye pressure) No date: Hearing loss 4 plus years ago: History of shingles     Comment: set in left eye No date: Hypertension No date: OSA on CPAP 06/05/2016: Persistent atrial fibrillation (HCC) No date: Presence of permanent cardiac pacemaker No date: Sebaceous cyst     Comment: Upper back No date: Shortness of breath dyspnea     Comment: with exertion No date: Sinus  bradycardia     Comment: s/p MDT PPM  01/2005: Traumatic brain injury (HCC) No date: Ventricular tachycardia (HCC)     Comment: 05/2016 requiring DCCV - started on amiodarone    Reproductive/Obstetrics negative OB ROS                             Anesthesia Physical Anesthesia Plan  ASA: III  Anesthesia Plan: General   Post-op Pain Management:    Induction:   Airway Management Planned:   Additional Equipment:   Intra-op Plan:   Post-operative Plan:   Informed Consent: I have reviewed the patients History and Physical, chart, labs and discussed the procedure including the risks, benefits and alternatives for the proposed anesthesia with the patient or authorized representative who has indicated his/her understanding and acceptance.   Dental Advisory Given  Plan Discussed with: Anesthesiologist, CRNA and Surgeon  Anesthesia Plan Comments:         Anesthesia Quick Evaluation

## 2016-07-15 NOTE — Discharge Instructions (Addendum)
Ureteral Stent Implantation, Care After Refer to this sheet in the next few weeks. These instructions provide you with information on caring for yourself after your procedure. Your health care provider may also give you more specific instructions. Your treatment has been planned according to current medical practices, but problems sometimes occur. Call your health care provider if you have any problems or questions after your procedure. WHAT TO EXPECT AFTER THE PROCEDURE You should be back to normal activity within 48 hours after the procedure. Nausea and vomiting may occur and are commonly the result of anesthesia. It is common to experience sharp pain in the back or lower abdomen and penis with voiding. This is caused by movement of the ends of the stent with the act of urinating.It usually goes away within minutes after you have stopped urinating. HOME CARE INSTRUCTIONS Make sure to drink plenty of fluids. You may have small amounts of bleeding, causing your urine to be red. This is normal. Certain movements may trigger pain or a feeling that you need to urinate. You may be given medicines to prevent infection or bladder spasms. Be sure to take all medicines as directed. Only take over-the-counter or prescription medicines for pain, discomfort, or fever as directed by your health care provider. Do not take aspirin, as this can make bleeding worse. Your stent will be left in until the blockage is resolved. This may take 2 weeks or longer, depending on the reason for stent implantation. You may have an X-ray exam to make sure your ureter is open and that the stent has not moved out of position (migrated). The stent can be removed by your health care provider in the office. Medicines may be given for comfort while the stent is being removed. Be sure to keep all follow-up appointments so your health care provider can check that you are healing properly. SEEK MEDICAL CARE IF:  You experience increasing  pain.  Your pain medicine is not working. SEEK IMMEDIATE MEDICAL CARE IF:  Your urine is dark red or has blood clots.  You are leaking urine (incontinent).  You have a fever, chills, feeling sick to your stomach (nausea), or vomiting.  Your pain is not relieved by pain medicine.  The end of the stent comes out of the urethra.  You are unable to urinate.   This information is not intended to replace advice given to you by your health care provider. Make sure you discuss any questions you have with your health care provider.   Document Released: 06/01/2013 Document Revised: 10/04/2013 Document Reviewed: 04/13/2015 Elsevier Interactive Patient Education 2016 [employer] Kidney Stones Kidney stones (urolithiasis) are solid masses that form inside your kidneys. The intense pain is caused by the stone moving through the kidney, ureter, bladder, and urethra (urinary tract). When the stone moves, the ureter starts to spasm around the stone. The stone is usually passed in your pee (urine).  HOME CARE  Drink enough fluids to keep your pee clear or pale yellow. This helps to get the stone out.  Take a 24-hour pee (urine) sample as told by your doctor. You may need to take another sample every 6-12 months.  Strain all pee through the provided strainer. Do not pee without peeing through the strainer, not even once. If you pee the stone out, catch it in the strainer. The stone may be as small as a grain of salt. Take this to your doctor. This will help your doctor figure out what you can do  to try to prevent more kidney stones.  Only take medicine as told by your doctor.  Make changes to your daily diet as told by your doctor. You may be told to:  Limit how much salt you eat.  Eat 5 or more servings of fruits and vegetables each day.  Limit how much meat, poultry, fish, and eggs you eat.  Keep all follow-up visits as told by your doctor. This is important.  Get follow-up X-rays as  told by your doctor. GET HELP IF: You have pain that gets worse even if you have been taking pain medicine. GET HELP RIGHT AWAY IF:   Your pain does not get better with medicine.  You have a fever or shaking chills.  Your pain increases and gets worse over 18 hours.  You have new belly (abdominal) pain.  You feel faint or pass out.  You are unable to pee.   This information is not intended to replace advice given to you by your health care provider. Make sure you discuss any questions you have with your health care provider.   Document Released: 03/17/2008 Document Revised: 06/20/2015 Document Reviewed: 03/02/2013 Elsevier Interactive Patient Education 2016 [employer] Ureteroscopy, Care After Refer to this sheet in the next few weeks. These instructions provide you with information on caring for yourself after your procedure. Your health care provider may also give you more specific instructions. Your treatment has been planned according to current medical practices, but problems sometimes occur. Call your health care provider if you have any problems or questions after your procedure.  WHAT TO EXPECT AFTER THE PROCEDURE  After your procedure, it is typical to have the following:   A burning sensation when you urinate.  Blood in your urine. HOME CARE INSTRUCTIONS   Only take medicines as directed by your health care provider. Do not take any over-the-counter pain medication unless your health care provider says it is okay.  Take a warm bath or hold a warm washcloth over your groin to relieve burning.  Drink enough fluids to keep your urine clear or pale yellow.  Drink two 8-ounce glasses of water every hour for the first 2 hours after you get home.  Continue to drink water often at home.  You can eat what you usually do.  Ask your surgeon when you can do your usual activities.  If you had a tube placed to keep urine flowing (ureteral stent), ask your health care  provider when you need to return to have it removed.  Keep all follow-up appointments. SEEK MEDICAL CARE IF:   You have chills or fever.  You have burning pain for longer than 24 hours after the procedure.  You have blood in your urine for longer than 24 hours after the procedure. SEEK IMMEDIATE MEDICAL CARE IF:   You have large amounts of blood or clots in your urine.  You have very bad pain.  You have chest pain or trouble breathing.   This information is not intended to replace advice given to you by your health care provider. Make sure you discuss any questions you have with your health care provider.   Document Released: 10/04/2013 Document Reviewed: 10/04/2013 Elsevier Interactive Patient Education 2016 [employer]  AMBULATORY SURGERY  DISCHARGE INSTRUCTIONS   1) The drugs that you were given will stay in your system until tomorrow so for the next 24 hours you should not:  A) Drive an automobile B) Make any legal decisions C) Drink any alcoholic  beverage   2) You may resume regular meals tomorrow.  Today it is better to start with liquids and gradually work up to solid foods.  You may eat anything you prefer, but it is better to start with liquids, then soup and crackers, and gradually work up to solid foods.   3) Please notify your doctor immediately if you have any unusual bleeding, trouble breathing, redness and pain at the surgery site, drainage, fever, or pain not relieved by medication.    4) Additional Instructions:        Please contact your physician with any problems or Same Day Surgery at [phone], Monday through Friday 6 am to 4 pm, or  at [facility] number at [phone].

## 2016-07-15 NOTE — Op Note (Signed)
Preoperative diagnosis: 1. Left ureterolithiasis                                            2. Left hydronephrosis                                            3. Left nephrolithiasis  Postoperative diagnosis: Same  Procedure: 1. Left ureteroscopic ureterolithotomy with holmium laser                                            lithotripsy                      2. Left retrograde pyelogram                      3. Left double pigtail ureteral stent placement                      4. Fluoroscopy  Surgeon: Suszanne Conners. Evelene Croon MD  Anesthesia: General. Local  Indications:See the history and physical. After informed consent the above procedure(s) were requested     Technique and findings: After adequate general anesthesia had been obtained patient was placed into dorsolithotomy position and the perineum was prepped and draped in the usual fashion. The cystoscope was coupled the camera and visually advanced into the bladder. Bladder was thoroughly inspected. No bladder tumors were identified. A 6 French open-ended ureteral catheter was advanced into the left orifice and retrograde pyelography was performed. Patient had an complete obstruction from a 3 cm by an centimeter stone extending from the UPJ to the distal ureter. The patient also had 2 large lower pole stones measuring prostate 10 x 10 mm each. At this point a 0.035 Glidewire was advanced up the ureter to the renal pelvis under fluoroscopic guidance. The distal ureter was dilated to 7 mm with a balloon dilating catheter. The balloon dilating catheter was then removed taking care leaving the guidewire in position. The rigid ureteroscope was then advanced into the ureter up to the level of the kidney stone. The 550  holmium laser fiber was introduced and the scope up to the stone. Power was set at 40 W and frequency of 10. The upper ureteral stone was then fragmented the fragments measuring less than 6 mm each. The renal stones were also fragmented. At  this point the ureteroscope was removed and cystoscope backloaded over the guidewire. A 6 x 28 cm double pigtail stent advanced over the guidewire and position in the ureter. Guidewire was removed taking care leaving the stent in position. The bladder was drained and cystoscope was removed. 10 cc of viscous Xylocaine was instilled within the urethra and bladder. A B&O suppository was placed. Procedure was then terminated and patient transferred to the recovery room in stable condition.

## 2016-07-15 NOTE — Anesthesia Procedure Notes (Signed)
Procedure Name: Intubation Date/Time: 07/15/2016 4:01 PM Performed by: Michaele Offer Pre-anesthesia Checklist: Patient identified, Emergency Drugs available, Suction available, Patient being monitored and Timeout performed Patient Re-evaluated:Patient Re-evaluated prior to inductionOxygen Delivery Method: Circle system utilized Preoxygenation: Pre-oxygenation with 100% oxygen Intubation Type: IV induction Ventilation: Oral airway inserted - appropriate to patient size Laryngoscope Size: Mac and 4 Grade View: Grade I Tube type: Oral Tube size: 7.5 mm Number of attempts: 1 Airway Equipment and Method: Rigid stylet Placement Confirmation: ETT inserted through vocal cords under direct vision,  positive ETCO2 and breath sounds checked- equal and bilateral Secured at: 21 cm Tube secured with: Tape Dental Injury: Teeth and Oropharynx as per pre-operative assessment

## 2016-07-15 NOTE — Transfer of Care (Signed)
Immediate Anesthesia Transfer of Care Note  Patient: Douglas Washington  Procedure(s) Performed: Procedure(s): URETEROSCOPY WITH HOLMIUM LASER LITHOTRIPSY (Left) CYSTOSCOPY WITH STENT PLACEMENT (Left)  Patient Location: PACU  Anesthesia Type:General  Level of Consciousness: awake and patient cooperative  Airway & Oxygen Therapy: Patient Spontanous Breathing and Patient connected to face mask oxygen  Post-op Assessment: Report given to RN and Post -op Vital signs reviewed and stable  Post vital signs: Reviewed and stable  Last Vitals:  Vitals:   07/15/16 1450 07/15/16 1721  BP: 109/68 (!) 138/92  Pulse: 72 66  Resp: 16 14  Temp: 36.6 C 36.7 C    Last Pain:  Vitals:   07/15/16 1450  TempSrc: Oral         Complications: No apparent anesthesia complications

## 2016-07-15 NOTE — Anesthesia Postprocedure Evaluation (Signed)
Anesthesia Post Note  Patient: Douglas Washington  Procedure(s) Performed: Procedure(s) (LRB): URETEROSCOPY WITH HOLMIUM LASER LITHOTRIPSY (Left) CYSTOSCOPY WITH STENT PLACEMENT (Left)  Patient location during evaluation: PACU Anesthesia Type: MAC Level of consciousness: awake and alert Pain management: pain level controlled Vital Signs Assessment: post-procedure vital signs reviewed and stable Respiratory status: spontaneous breathing, nonlabored ventilation, respiratory function stable and patient connected to nasal cannula oxygen Cardiovascular status: stable and blood pressure returned to baseline Postop Assessment: no signs of nausea or vomiting Anesthetic complications: no    Last Vitals:  Vitals:   07/15/16 1806 07/15/16 1853  BP: (P) 139/74 133/62  Pulse: (P) 63 60  Resp: (P) 16 16  Temp: (P) 36.7 C     Last Pain:  Vitals:   07/15/16 1853  TempSrc:   PainSc: 0-No pain                 Yevette Edwards

## 2016-07-15 NOTE — H&P (Signed)
H&P completed 07/11/16 and will be scanned into chart.

## 2016-07-15 NOTE — H&P (Signed)
Date of Initial H&P: 07/11/16   History reviewed, patient examined, no change in status, stable for surgery.

## 2016-07-16 ENCOUNTER — Telehealth: Payer: Self-pay | Admitting: Nurse Practitioner

## 2016-07-16 ENCOUNTER — Encounter: Payer: Self-pay | Admitting: Urology

## 2016-07-16 NOTE — Telephone Encounter (Signed)
SPOKE TO WIFE  ABOUT SYMPTOMS AND CONFIRMED THAT PT IS NOT HAVING ANY SOB  AND WANT TO LET CLINIC KNOW ABOUT WEIGHT GAIN. AND ADVICE GIVEN PER SEILER OF DAILY WIEGHTS AND LOW SALT INTAKE ALONG WITH CALLING  BACK  FRIDAY AND FOR AN UPDATE

## 2016-07-16 NOTE — Telephone Encounter (Signed)
New Message:    Pt had a kidney stone removed yesterday afternoon. He have gained 4 lbs since yesterday. He was told to call if he gained 3 lbs or more. Please call to advise.

## 2016-07-16 NOTE — Telephone Encounter (Signed)
Please call patient. If no increased shortness of breath, would not make changes at this time. Continue daily weights, low sodium diet.  Call back on Friday with update.  Gypsy Balsam, NP 07/16/2016 9:42 AM

## 2016-07-17 NOTE — H&P (Signed)
H&P 07/11/16 Douglas Ape, MD  Urology     NAME:  Douglas Washington                          PHYSICIAN:  Anola Gurney          DATE OF BIRTH:  November 18, 1928                              HISTORY AND PHYSICAL   Same-day surgery, October 3.  CHIEF COMPLAINT:  Left flank pain.  HISTORY OF PRESENT ILLNESS:  Douglas Washington is an 82 year old, Caucasian male, who fell down with bruising and was noted to have bruising of his abdominal wall.  At the end of August.  He had a CT scan done at that time, and was noted to have multiple large renal stones with left hydronephrosis.  Follow up in the office on September 27 indicated that he had 2 large stones in the proximal left ureter measuring 10 x 50 mm in aggregate as well as 2 large lower pole stones on the left side measuring 5 x 7 mm and 10 x 10 mm.  The patient comes in now for left ureteroscopic ureterolithotomy with holmium laser lithotripsy.  The patient has been cleared by his cardiologist for this procedure.  PAST MEDICAL HISTORY:  The patient was allergic to penicillin, sulfa, oxycodone Zocor and simvastatin.  CURRENT MEDICATIONS:  Included Eliquis, Coreg, amiodarone, glucosamine, omega-3, Krill oil, multivitamins, ProAir inhaler, lisinopril, Peenuts supplement, saw palmetto, Zyrtec, erythromycin eye ointment, timolol ophthalmic solution, latanoprost eye drops, brimonidine ophthalmic solution, dorzolamide ophthalmic solution, prednisolone ophthalmic solution, montelukast, ultimate pro-support vitamin, valacyclovir and tamsulosin.  SURGICAL HISTORY: 1. Left ureteroscopic ureterolithotomy in 1996. 2. Lithotripsy, 2001. 3. Left hip replacement, 2005. 4. 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 5. Right shoulder replacement, 2009. 6. TUMT in 2009. 7. Left shoulder replaced in 2010. 8. Pacemaker placed in 2011. 9. TUMT in march, 2017. 10.Removal of upper back sebaceous cyst in 2016.  SOCIAL HISTORY:  Patient denied  tobacco or alcohol use.  FAMILY HISTORY:  Mother died at age 52 with diabetes.  Father died age 30 from heart disease.  PAST AND CURRENT MEDICAL CONDITIONS: 1. Cardiac arrhythmias status post pacemaker placement. 2. Hypertension. 3. COPD. 4. Sleep apnea. 5. Glaucoma. 6. Hearing loss. 7. BPH.  REVIEW OF SYSTEMS:  The patient denied chest pain, or shortness of breath.  He denies stroke.  He has joint stiffness and pain.  His has weakness and fatigue.  He does have decreased auditory ability.  He has decreased visual acuity.  PHYSICAL EXAMINATION:  GENERAL:  Elderly white male, in no acute distress. HEENT:  Sclerae clear. NECK:  Supple.  No palpable adenopathy. LUNGS:  Clear to auscultation. CARDIOVASCULAR:  Irregular rhythm without audible murmurs. ABDOMEN:  Soft, nontender abdomen. GU AND RECTAL:  Deferred. NEUROMUSCULAR:  Alert and oriented x3.  IMPRESSION: 1. Large obstructing left proximal ureteral stone 2 stones. 2. Left nephrolithiasis. 3. Hydronephrosis.  PLAN:  Left ureteroscopic ureterolithotomy with holmium laser lithotripsy, and stent placement.          ______________________________ Anola Gurney     MW/MEDQ  D:  07/10/2016  T:  07/10/2016  Job:  [id number]     Electronically signed by Douglas Ape, MD at 07/11/2016 9:40 AM

## 2016-07-18 ENCOUNTER — Telehealth: Payer: Self-pay | Admitting: Nurse Practitioner

## 2016-07-18 NOTE — Telephone Encounter (Signed)
SPOKE TO PT WIFE AND SAYS PT IS DOING BETTER AND BACK TO HIS NORMAL WEIGHT AND USING THE BATHROOM FREQUENTLY

## 2016-07-18 NOTE — Telephone Encounter (Signed)
New Message  Pt wife call requesting to speak with RN about update on pt. Pt wife states pt has gained 4lbs. Pt wife states ppt is doing okay. Please call back to discuss

## 2016-07-21 ENCOUNTER — Ambulatory Visit (HOSPITAL_COMMUNITY)
Admission: RE | Admit: 2016-07-21 | Discharge: 2016-07-21 | Disposition: A | Discharge status: Home / Self Care | Payer: Medicare Other | Source: Ambulatory Visit | Attending: Cardiovascular Disease | Admitting: Cardiovascular Disease

## 2016-07-21 ENCOUNTER — Ambulatory Visit (HOSPITAL_BASED_OUTPATIENT_CLINIC_OR_DEPARTMENT_OTHER): Payer: Medicare Other

## 2016-07-21 ENCOUNTER — Encounter (HOSPITAL_COMMUNITY)
Admission: RE | Disposition: A | Discharge status: Home / Self Care | Payer: Self-pay | Source: Ambulatory Visit | Attending: Cardiovascular Disease

## 2016-07-21 ENCOUNTER — Other Ambulatory Visit (HOSPITAL_COMMUNITY): Payer: Self-pay | Admitting: *Deleted

## 2016-07-21 ENCOUNTER — Encounter (HOSPITAL_COMMUNITY): Payer: Self-pay | Admitting: Cardiovascular Disease

## 2016-07-21 DIAGNOSIS — Z7901 Long term (current) use of anticoagulants: Secondary | ICD-10-CM | POA: Diagnosis not present

## 2016-07-21 DIAGNOSIS — Z882 Allergy status to sulfonamides status: Secondary | ICD-10-CM | POA: Insufficient documentation

## 2016-07-21 DIAGNOSIS — N200 Calculus of kidney: Secondary | ICD-10-CM | POA: Diagnosis not present

## 2016-07-21 DIAGNOSIS — Z950 Presence of cardiac pacemaker: Secondary | ICD-10-CM | POA: Diagnosis not present

## 2016-07-21 DIAGNOSIS — Z833 Family history of diabetes mellitus: Secondary | ICD-10-CM | POA: Diagnosis not present

## 2016-07-21 DIAGNOSIS — G4733 Obstructive sleep apnea (adult) (pediatric): Secondary | ICD-10-CM | POA: Insufficient documentation

## 2016-07-21 DIAGNOSIS — I253 Aneurysm of heart: Secondary | ICD-10-CM | POA: Diagnosis not present

## 2016-07-21 DIAGNOSIS — Z8249 Family history of ischemic heart disease and other diseases of the circulatory system: Secondary | ICD-10-CM | POA: Insufficient documentation

## 2016-07-21 DIAGNOSIS — J449 Chronic obstructive pulmonary disease, unspecified: Secondary | ICD-10-CM | POA: Diagnosis not present

## 2016-07-21 DIAGNOSIS — I340 Nonrheumatic mitral (valve) insufficiency: Secondary | ICD-10-CM

## 2016-07-21 DIAGNOSIS — I2510 Atherosclerotic heart disease of native coronary artery without angina pectoris: Secondary | ICD-10-CM | POA: Diagnosis not present

## 2016-07-21 DIAGNOSIS — Z87891 Personal history of nicotine dependence: Secondary | ICD-10-CM | POA: Diagnosis not present

## 2016-07-21 DIAGNOSIS — Z01818 Encounter for other preprocedural examination: Secondary | ICD-10-CM | POA: Insufficient documentation

## 2016-07-21 DIAGNOSIS — Z880 Allergy status to penicillin: Secondary | ICD-10-CM | POA: Diagnosis not present

## 2016-07-21 DIAGNOSIS — N189 Chronic kidney disease, unspecified: Secondary | ICD-10-CM | POA: Insufficient documentation

## 2016-07-21 DIAGNOSIS — Z87820 Personal history of traumatic brain injury: Secondary | ICD-10-CM | POA: Diagnosis not present

## 2016-07-21 DIAGNOSIS — I129 Hypertensive chronic kidney disease with stage 1 through stage 4 chronic kidney disease, or unspecified chronic kidney disease: Secondary | ICD-10-CM | POA: Insufficient documentation

## 2016-07-21 DIAGNOSIS — I481 Persistent atrial fibrillation: Secondary | ICD-10-CM | POA: Insufficient documentation

## 2016-07-21 DIAGNOSIS — I472 Ventricular tachycardia: Secondary | ICD-10-CM | POA: Diagnosis not present

## 2016-07-21 DIAGNOSIS — Z96642 Presence of left artificial hip joint: Secondary | ICD-10-CM | POA: Diagnosis not present

## 2016-07-21 HISTORY — PX: TEE WITHOUT CARDIOVERSION: SHX5443

## 2016-07-21 SURGERY — ECHOCARDIOGRAM, TRANSESOPHAGEAL
Anesthesia: Moderate Sedation

## 2016-07-21 MED ORDER — SODIUM CHLORIDE 0.9 % IV SOLN
INTRAVENOUS | Status: DC
  Administered 2016-07-21: 10:00:00 via INTRAVENOUS

## 2016-07-21 MED ORDER — FENTANYL CITRATE (PF) 100 MCG/2ML IJ SOLN
INTRAMUSCULAR | Status: AC
  Filled 2016-07-21: qty 2

## 2016-07-21 MED ORDER — MIDAZOLAM HCL 10 MG/2ML IJ SOLN
INTRAMUSCULAR | Status: DC | PRN
  Administered 2016-07-21: 1 mg via INTRAVENOUS
  Administered 2016-07-21: .5 mg via INTRAVENOUS
  Administered 2016-07-21: 2 mg via INTRAVENOUS

## 2016-07-21 MED ORDER — MIDAZOLAM HCL 5 MG/ML IJ SOLN
INTRAMUSCULAR | Status: AC
  Filled 2016-07-21: qty 2

## 2016-07-21 MED ORDER — BUTAMBEN-TETRACAINE-BENZOCAINE 2-2-14 % EX AERO
INHALATION_SPRAY | CUTANEOUS | Status: DC | PRN
  Administered 2016-07-21: 2 via TOPICAL

## 2016-07-21 MED ORDER — FENTANYL CITRATE (PF) 100 MCG/2ML IJ SOLN
INTRAMUSCULAR | Status: DC | PRN
  Administered 2016-07-21 (×2): 25 ug via INTRAVENOUS

## 2016-07-21 NOTE — Interval H&P Note (Signed)
History and Physical Interval Note:  07/21/2016 10:54 AM  Douglas Washington  has presented today for surgery, with the diagnosis of AFIB  The various methods of treatment have been discussed with the patient and family. After consideration of risks, benefits and other options for treatment, the patient has consented to  Procedure(s): TRANSESOPHAGEAL ECHOCARDIOGRAM (TEE) (N/A) as a surgical intervention .  The patient's history has been reviewed, patient examined, no change in status, stable for surgery.  I have reviewed the patient's chart and labs.  Questions were answered to the patient's satisfaction.     Lilleigh Hechavarria C. Duke Salvia, MD, [facility] 07/21/2016 10:54 AM

## 2016-07-21 NOTE — H&P (View-Only) (Signed)
Electrophysiology Office Note Date: 07/10/2016  ID:  Douglas Beeoy L Washington, DOB June 16, 1929, MRN [id number]  PCP: Rozanna BoxBABAOFF, MARC E, MD Primary Cardiologist: Laurena BeringKowalksi Electrophysiologist: Allred  CC: VT follow-up  Douglas Washington is a 82 y.o. male seen today for Dr Johney FrameAllred.  He presents today for routine electrophysiology followup. Since last being seen in clinic, the patient reports doing reasonably well. His orthostatic intolerance has improved with lower dose BB.  He has been found to have large kidney stone causing obstruction and is in need of surgical clearance for lithotripsy. He is walking twice daily without chest pain or shortness of breath.  He denies chest pain, palpitations, dyspnea, PND, orthopnea, nausea, vomiting, dizziness, syncope, edema, weight gain, or early satiety.    Device History: MDT dual chamber PPM implanted 2011 for sinus bradycardia   Past Medical History:  Diagnosis Date  . Allergy   . COPD (chronic obstructive pulmonary disease) (HCC)   . Hearing loss   . Hypertension   . OSA on CPAP   . Persistent atrial fibrillation (HCC)   . Sebaceous cyst    Upper back  . Sinus bradycardia    s/p MDT PPM   . Traumatic brain injury (HCC) 01/2005  . Ventricular tachycardia (HCC)    05/2016 requiring DCCV - started on amiodarone    Past Surgical History:  Procedure Laterality Date  . CARDIAC CATHETERIZATION N/A 06/06/2016   Procedure: Left Heart Cath and Coronary Angiography;  Surgeon: Kathleene Hazelhristopher D McAlhany, MD;  [facility];  Service: Cardiovascular;  Laterality: N/A;  . CRANIOTOMY  03/2005  . PACEMAKER INSERTION  2011   MDT dual chamber PPM followed by [facility] implanted for sinus bradycardia  . SHOULDER ACROMIOPLASTY Bilateral   . TOTAL HIP ARTHROPLASTY Left 2005    Current Outpatient Prescriptions  Medication Sig Dispense Refill  . albuterol (PROVENTIL HFA;VENTOLIN HFA) 108 (90 BASE) MCG/ACT inhaler Inhale 2 puffs into the lungs every 6 (six) hours as  needed for wheezing or shortness of breath.    Marland Kitchen. amiodarone (PACERONE) 200 MG tablet Take 1 tablet by mouth twice a day X's 2 weeks then go down to 1 tablet by mouth daily 60 tablet 11  . apixaban (ELIQUIS) 5 MG TABS tablet Take 1 tablet (5 mg total) by mouth 2 (two) times daily. 60 tablet 0  . brimonidine (ALPHAGAN) 0.2 % ophthalmic solution Place 1 drop into the left eye 2 (two) times daily.  5  . carvedilol (COREG) 3.125 MG tablet Take 1 tablet (3.125 mg total) by mouth 2 (two) times daily with a meal. 180 tablet 3  . cetirizine (ZYRTEC) 10 MG tablet Take 10 mg by mouth daily.    . dorzolamide (TRUSOPT) 2 % ophthalmic solution Place 1 drop into both eyes 2 (two) times daily.    Marland Kitchen. erythromycin ophthalmic ointment Place 1 application into both eyes every other day. Every other night  3  . latanoprost (XALATAN) 0.005 % ophthalmic solution Place 1 drop into both eyes at bedtime.    Marland Kitchen. lisinopril (PRINIVIL,ZESTRIL) 5 MG tablet Take 5 mg by mouth daily.    Marland Kitchen. MEGARED OMEGA-3 KRILL OIL 500 MG CAPS Take 500 mg by mouth daily.     . montelukast (SINGULAIR) 10 MG tablet Take 10 mg by mouth at bedtime.    . Multiple Vitamins-Minerals (MULTIVITAMIN WITH MINERALS) tablet Take 1 tablet by mouth daily.    . prednisoLONE acetate (PRED FORTE) 1 % ophthalmic suspension Place 1 drop into the  left eye every other day.     . saw palmetto 500 MG capsule Take 500 mg by mouth daily.    . timolol (TIMOPTIC) 0.5 % ophthalmic solution Place 1 drop into the left eye 2 (two) times daily.  4  . valACYclovir (VALTREX) 500 MG tablet Take 500 mg by mouth daily.     No current facility-administered medications for this visit.     Allergies:   Penicillins; Adhesive [tape]; Ciprofloxacin; Oxycodone; Simvastatin; Statins; and Sulfa antibiotics   Social History: Social History   Social History  . Marital status: Married    Spouse name: N/A  . Number of children: N/A  . Years of education: N/A   Occupational History  .  Retired    Social History Main Topics  . Smoking status: Former Smoker    Quit date: 04/14/1974  . Smokeless tobacco: Former Neurosurgeon    Quit date: 04/14/1974  . Alcohol use No  . Drug use: No  . Sexual activity: Not on file   Other Topics Concern  . Not on file   Social History Narrative  . No narrative on file    Family History: Family History  Problem Relation Age of Onset  . Diabetes Mother   . Heart disease Father   . Heart disease Brother      Review of Systems: All other systems reviewed and are otherwise negative except as noted above.   Physical Exam: VS:  BP 108/70   Pulse 79   Ht 5\' 10"  (1.778 m)   Wt 213 lb 6.4 oz (96.8 kg)   SpO2 98%   BMI 30.62 kg/m  , BMI Body mass index is 30.62 kg/m.  GEN- The patient is elderly appearing, alert and oriented x 3 today.   HEENT: normocephalic, atraumatic; sclera clear, conjunctiva pink; hearing intact; oropharynx clear; neck supple Lungs- Clear to ausculation bilaterally, normal work of breathing.  No wheezes, rales, rhonchi Heart- Regular rate and rhythm GI- soft, non-tender, non-distended, bowel sounds present Extremities- no clubbing, cyanosis, or edema; DP/PT/radial pulses 2+ bilaterally MS- no significant deformity or atrophy Skin- warm and dry, no rash or lesion; PPM pocket well healed Psych- euthymic mood, full affect Neuro- strength and sensation are intact  PPM Interrogation- reviewed in detail today,  See PACEART report  EKG:  EKG is not ordered today.  Recent Labs: 06/05/2016: B Natriuretic Peptide 120.5; Magnesium 2.1; TSH 1.339 06/12/2016: ALT 25; BUN 23; Creat 1.03; Hemoglobin 10.1; Platelets 288; Potassium 5.2; Sodium 135   Wt Readings from Last 3 Encounters:  07/10/16 213 lb 6.4 oz (96.8 kg)  06/12/16 218 lb 12.8 oz (99.2 kg)  06/09/16 213 lb 3.2 oz (96.7 kg)     Other studies Reviewed: Additional studies/ records that were reviewed today include: hospital records   Assessment and Plan:  1.   Ventricular tachycardia No recurrence on amiodarone Continue amiodarone 200mg  daily   TSH, LFT's recently normal No driving x6 months (pt aware)  2. Symptomatic bradycardia Normal PPM function See Pace Art report No changes today  3.  Persistent atrial fibrillation Converted to SR with DCCV for VT Started on Eliquis during hospitalization - previously not on OAC 2/2 SDH CHADS2VASC is 4 Will schedule Watchman screening TEE and tentatively plan for 10-19.   4.  HTN Stable No change required today  5.  CAD No targets for revascularization at recent cath Continue medical therapy  No ASA with OAC On BB - decrease dose to 3.125mg  twice daily with  dizziness and hypotension Intolerant of statins   6. Surgical clearance The patient is at moderate risk for lithotripsy with anesthesia. He had recent cath with no disease amenable to PCI.  Would continue BB in the peri-operative period. He is now 4 weeks post cardioversion to SR.  If necessary to hold Eliquis for procedure, ok to do so, but [facility]. Clearance letter faxed today.    Current medicines are reviewed at length with the patient today.   The patient does not have concerns regarding his medicines.  The following changes were made today:  none  Labs/ tests ordered today include: TEE for Watchman screening No orders of the defined types were placed in this encounter.    Disposition:   Follow up with me by phone following TEE   Signed, Gypsy BalsamAmber Seiler, NP 07/10/2016 10:25 AM  [facility] [address] [place] [address] [phone] (office) [phone] (fax)

## 2016-07-21 NOTE — Progress Notes (Signed)
  Echocardiogram Echocardiogram Transesophageal has been performed.  Leta Jungling M 07/21/2016, 11:44 AM

## 2016-07-21 NOTE — CV Procedure (Signed)
Brief TEE Note  LVEF >55% Moderate to severely enlarged left atrium No LA/LAA thrombus or mass Mild MR  During this procedure the patient is administered a total of Versed 3.5 mg and Fentanyl 50 mcg to achieve and maintain moderate conscious sedation.  The patient's heart rate, blood pressure, and oxygen saturation are monitored continuously during the procedure. The period of conscious sedation is 17 minutes, of which I was present face-to-face 100% of this time.  For additional details see full report.   Douglas Loria C. Duke Salvia, MD, [facility]  07/21/2016 11:22 AM

## 2016-07-22 ENCOUNTER — Encounter (HOSPITAL_COMMUNITY): Payer: Self-pay | Admitting: Cardiovascular Disease

## 2016-07-22 ENCOUNTER — Telehealth: Payer: Self-pay | Admitting: Internal Medicine

## 2016-07-22 NOTE — Telephone Encounter (Signed)
new message   Request for surgical clearance:  1. What type of surgery is being performed? teeth cleaning  2. When is this surgery scheduled?07/28/16  3. Are there any medications that need to be held prior to surgery and how long? unklnown  4. Name of physician performing surgery? Dr.Harris  5. What is your office phone and fax number?  [phone]   Fax  [phone]

## 2016-07-24 ENCOUNTER — Encounter: Payer: Self-pay | Admitting: Nurse Practitioner

## 2016-07-24 NOTE — Telephone Encounter (Signed)
Spoke with patient's wife. Pt doing well from urology standpoint. Will plan to proceed with Watchman next Thursday.  Instructions reviewed. Arrive at Short Stay at 10AM morning of procedure. Hold all medications morning of procedure but take Eliquis night before.  NPO after midnight Wednesday night.  Gypsy Balsam, NP 07/24/2016 8:18 AM

## 2016-07-24 NOTE — Telephone Encounter (Signed)
Spoke with wife. She is postponing dental appt.  Gypsy Balsam, NP 07/24/2016 8:19 AM

## 2016-07-28 ENCOUNTER — Encounter: Payer: Self-pay | Admitting: Nurse Practitioner

## 2016-07-31 ENCOUNTER — Ambulatory Visit (HOSPITAL_COMMUNITY): Payer: Medicare Other | Admitting: Certified Registered Nurse Anesthetist

## 2016-07-31 ENCOUNTER — Inpatient Hospital Stay (HOSPITAL_COMMUNITY)
Admission: AD | Admit: 2016-07-31 | Discharge: 2016-08-01 | DRG: 274 | Disposition: A | Discharge status: Home / Self Care | Payer: Medicare Other | Source: Ambulatory Visit | Attending: Cardiovascular Disease | Admitting: Cardiovascular Disease

## 2016-07-31 ENCOUNTER — Encounter (HOSPITAL_COMMUNITY): Payer: Self-pay | Admitting: Certified Registered Nurse Anesthetist

## 2016-07-31 ENCOUNTER — Encounter (HOSPITAL_COMMUNITY)
Admission: AD | Disposition: A | Discharge status: Home / Self Care | Payer: Self-pay | Source: Ambulatory Visit | Attending: Cardiovascular Disease

## 2016-07-31 ENCOUNTER — Ambulatory Visit (HOSPITAL_COMMUNITY): Payer: Medicare Other

## 2016-07-31 DIAGNOSIS — Z880 Allergy status to penicillin: Secondary | ICD-10-CM | POA: Diagnosis not present

## 2016-07-31 DIAGNOSIS — H9190 Unspecified hearing loss, unspecified ear: Secondary | ICD-10-CM | POA: Diagnosis present

## 2016-07-31 DIAGNOSIS — Z882 Allergy status to sulfonamides status: Secondary | ICD-10-CM

## 2016-07-31 DIAGNOSIS — J449 Chronic obstructive pulmonary disease, unspecified: Secondary | ICD-10-CM | POA: Diagnosis present

## 2016-07-31 DIAGNOSIS — Z8249 Family history of ischemic heart disease and other diseases of the circulatory system: Secondary | ICD-10-CM

## 2016-07-31 DIAGNOSIS — Z006 Encounter for examination for normal comparison and control in clinical research program: Secondary | ICD-10-CM

## 2016-07-31 DIAGNOSIS — Z950 Presence of cardiac pacemaker: Secondary | ICD-10-CM | POA: Diagnosis not present

## 2016-07-31 DIAGNOSIS — Z7952 Long term (current) use of systemic steroids: Secondary | ICD-10-CM

## 2016-07-31 DIAGNOSIS — I472 Ventricular tachycardia: Secondary | ICD-10-CM | POA: Diagnosis present

## 2016-07-31 DIAGNOSIS — Z79899 Other long term (current) drug therapy: Secondary | ICD-10-CM

## 2016-07-31 DIAGNOSIS — Z87820 Personal history of traumatic brain injury: Secondary | ICD-10-CM

## 2016-07-31 DIAGNOSIS — I10 Essential (primary) hypertension: Secondary | ICD-10-CM | POA: Diagnosis present

## 2016-07-31 DIAGNOSIS — G4733 Obstructive sleep apnea (adult) (pediatric): Secondary | ICD-10-CM | POA: Diagnosis present

## 2016-07-31 DIAGNOSIS — I2510 Atherosclerotic heart disease of native coronary artery without angina pectoris: Secondary | ICD-10-CM | POA: Diagnosis present

## 2016-07-31 DIAGNOSIS — Z885 Allergy status to narcotic agent status: Secondary | ICD-10-CM | POA: Diagnosis not present

## 2016-07-31 DIAGNOSIS — I4819 Other persistent atrial fibrillation: Secondary | ICD-10-CM | POA: Diagnosis present

## 2016-07-31 DIAGNOSIS — Z96642 Presence of left artificial hip joint: Secondary | ICD-10-CM | POA: Diagnosis present

## 2016-07-31 DIAGNOSIS — Z7901 Long term (current) use of anticoagulants: Secondary | ICD-10-CM | POA: Diagnosis not present

## 2016-07-31 DIAGNOSIS — Z833 Family history of diabetes mellitus: Secondary | ICD-10-CM

## 2016-07-31 DIAGNOSIS — Z881 Allergy status to other antibiotic agents status: Secondary | ICD-10-CM

## 2016-07-31 DIAGNOSIS — I481 Persistent atrial fibrillation: Secondary | ICD-10-CM

## 2016-07-31 DIAGNOSIS — Z87891 Personal history of nicotine dependence: Secondary | ICD-10-CM | POA: Diagnosis not present

## 2016-07-31 DIAGNOSIS — I4891 Unspecified atrial fibrillation: Secondary | ICD-10-CM | POA: Diagnosis present

## 2016-07-31 DIAGNOSIS — Z888 Allergy status to other drugs, medicaments and biological substances status: Secondary | ICD-10-CM

## 2016-07-31 HISTORY — PX: LEFT ATRIAL APPENDAGE OCCLUSION: SHX173A

## 2016-07-31 LAB — BASIC METABOLIC PANEL
Anion gap: 5 (ref 5–15)
BUN: 25 mg/dL — AB (ref 6–20)
CALCIUM: 9.6 mg/dL (ref 8.9–10.3)
CO2: 23 mmol/L (ref 22–32)
CREATININE: 1.11 mg/dL (ref 0.61–1.24)
Chloride: 108 mmol/L (ref 101–111)
GFR calc Af Amer: >60 mL/min (ref >60)
GFR, EST NON AFRICAN AMERICAN: 59 mL/min — AB (ref >60)
GLUCOSE: 119 mg/dL — AB (ref 65–99)
POTASSIUM: 4.4 mmol/L (ref 3.5–5.1)
SODIUM: 136 mmol/L (ref 135–145)

## 2016-07-31 LAB — POCT ACTIVATED CLOTTING TIME
ACTIVATED CLOTTING TIME: 235 s
Activated Clotting Time: 213 seconds
Activated Clotting Time: 158 seconds

## 2016-07-31 LAB — CBC
HCT: 36.4 % — ABNORMAL LOW (ref 39.0–52.0)
Hemoglobin: 11.5 g/dL — ABNORMAL LOW (ref 13.0–17.0)
MCH: 26.5 pg (ref 26.0–34.0)
MCHC: 31.6 g/dL (ref 30.0–36.0)
MCV: 83.9 fL (ref 78.0–100.0)
PLATELETS: 261 10*3/uL (ref 150–400)
RBC: 4.34 MIL/uL (ref 4.22–5.81)
RDW: 17.4 % — AB (ref 11.5–15.5)
WBC: 4.1 10*3/uL (ref 4.0–10.5)

## 2016-07-31 LAB — ABO/RH: ABO/RH(D): O POS

## 2016-07-31 LAB — PROTIME-INR
INR: 1.16
PROTHROMBIN TIME: 14.8 s (ref 11.4–15.2)

## 2016-07-31 LAB — TYPE AND SCREEN
ABO/RH(D): O POS
ANTIBODY SCREEN: NEGATIVE

## 2016-07-31 SURGERY — LEFT ATRIAL APPENDAGE OCCLUSION
Anesthesia: Monitor Anesthesia Care

## 2016-07-31 MED ORDER — LIDOCAINE HCL (PF) 1 % IJ SOLN
INTRAMUSCULAR | Status: DC | PRN
  Administered 2016-07-31: 10 mL via INTRADERMAL

## 2016-07-31 MED ORDER — HEPARIN SODIUM (PORCINE) 1000 UNIT/ML IJ SOLN
INTRAMUSCULAR | Status: DC | PRN
  Administered 2016-07-31: 2000 [IU] via INTRAVENOUS

## 2016-07-31 MED ORDER — APIXABAN 5 MG PO TABS
5.0000 mg | ORAL_TABLET | Freq: Two times a day (BID) | ORAL | Status: DC
  Administered 2016-07-31 – 2016-08-01 (×2): 5 mg via ORAL
  Filled 2016-07-31 (×2): qty 1

## 2016-07-31 MED ORDER — VALACYCLOVIR HCL 500 MG PO TABS
500.0000 mg | ORAL_TABLET | Freq: Every day | ORAL | Status: DC
  Administered 2016-08-01: 09:00:00 500 mg via ORAL
  Filled 2016-07-31: qty 1

## 2016-07-31 MED ORDER — LIDOCAINE HCL (CARDIAC) 20 MG/ML IV SOLN
INTRAVENOUS | Status: DC | PRN
  Administered 2016-07-31: 100 mg via INTRAVENOUS

## 2016-07-31 MED ORDER — AMIODARONE HCL 200 MG PO TABS
200.0000 mg | ORAL_TABLET | Freq: Every day | ORAL | Status: DC
  Administered 2016-08-01: 200 mg via ORAL
  Filled 2016-07-31: qty 1

## 2016-07-31 MED ORDER — GLUCOSAMINE 1500 COMPLEX PO CAPS: 1.0000 | ORAL_CAPSULE | Freq: Every day | ORAL | Status: DC

## 2016-07-31 MED ORDER — ONDANSETRON HCL 4 MG/2ML IJ SOLN
INTRAMUSCULAR | Status: DC | PRN
  Administered 2016-07-31: 4 mg via INTRAVENOUS

## 2016-07-31 MED ORDER — SUGAMMADEX SODIUM 200 MG/2ML IV SOLN
INTRAVENOUS | Status: DC | PRN
  Administered 2016-07-31: 400 mg via INTRAVENOUS

## 2016-07-31 MED ORDER — SODIUM CHLORIDE 0.45 % IV SOLN: INTRAVENOUS | Status: DC

## 2016-07-31 MED ORDER — LISINOPRIL 5 MG PO TABS
5.0000 mg | ORAL_TABLET | Freq: Every day | ORAL | Status: DC
  Filled 2016-07-31: qty 1

## 2016-07-31 MED ORDER — VANCOMYCIN HCL IN DEXTROSE 1-5 GM/200ML-% IV SOLN
INTRAVENOUS | Status: AC
  Filled 2016-07-31: qty 200

## 2016-07-31 MED ORDER — LIDOCAINE HCL (PF) 1 % IJ SOLN
INTRAMUSCULAR | Status: AC
  Filled 2016-07-31: qty 30

## 2016-07-31 MED ORDER — DORZOLAMIDE HCL 2 % OP SOLN
1.0000 [drp] | Freq: Two times a day (BID) | OPHTHALMIC | Status: DC
  Filled 2016-07-31: qty 10

## 2016-07-31 MED ORDER — SODIUM CHLORIDE 0.9% FLUSH: 3.0000 mL | INTRAVENOUS | Status: DC | PRN

## 2016-07-31 MED ORDER — VANCOMYCIN HCL IN DEXTROSE 1-5 GM/200ML-% IV SOLN
1000.0000 mg | INTRAVENOUS | Status: AC
  Administered 2016-07-31: 1000 mg via INTRAVENOUS

## 2016-07-31 MED ORDER — HEPARIN (PORCINE) IN NACL 2-0.9 UNIT/ML-% IJ SOLN
INTRAMUSCULAR | Status: DC | PRN
  Administered 2016-07-31: 13:00:00

## 2016-07-31 MED ORDER — GLUCOSAMINE HCL 1500 MG PO TABS: 1500.0000 mg | ORAL_TABLET | Freq: Every day | ORAL | Status: DC

## 2016-07-31 MED ORDER — SODIUM CHLORIDE 0.9% FLUSH
3.0000 mL | Freq: Two times a day (BID) | INTRAVENOUS | Status: DC
  Administered 2016-07-31: 22:00:00 3 mL via INTRAVENOUS

## 2016-07-31 MED ORDER — HEPARIN SODIUM (PORCINE) 1000 UNIT/ML IJ SOLN
INTRAMUSCULAR | Status: AC
  Filled 2016-07-31: qty 1

## 2016-07-31 MED ORDER — UROGESIC-BLUE 81.6 MG PO TABS: 1.0000 | ORAL_TABLET | Freq: Four times a day (QID) | ORAL | Status: DC | PRN

## 2016-07-31 MED ORDER — CARVEDILOL 3.125 MG PO TABS
3.1250 mg | ORAL_TABLET | Freq: Two times a day (BID) | ORAL | Status: DC
  Administered 2016-07-31 – 2016-08-01 (×2): 3.125 mg via ORAL
  Filled 2016-07-31 (×2): qty 1

## 2016-07-31 MED ORDER — LATANOPROST 0.005 % OP SOLN
1.0000 [drp] | Freq: Every day | OPHTHALMIC | Status: DC
  Filled 2016-07-31: qty 2.5

## 2016-07-31 MED ORDER — ACETAMINOPHEN 325 MG PO TABS: 650.0000 mg | ORAL_TABLET | ORAL | Status: DC | PRN

## 2016-07-31 MED ORDER — PHENYLEPHRINE HCL 10 MG/ML IJ SOLN
INTRAVENOUS | Status: DC | PRN
  Administered 2016-07-31: 20 ug/min via INTRAVENOUS

## 2016-07-31 MED ORDER — ADULT MULTIVITAMIN W/MINERALS CH
1.0000 | ORAL_TABLET | Freq: Every day | ORAL | Status: DC
  Administered 2016-07-31 – 2016-08-01 (×2): 1 via ORAL
  Filled 2016-07-31 (×2): qty 1

## 2016-07-31 MED ORDER — HEPARIN SODIUM (PORCINE) 1000 UNIT/ML IJ SOLN
INTRAMUSCULAR | Status: DC | PRN
  Administered 2016-07-31: 3 mL via INTRAVENOUS
  Administered 2016-07-31: 12 mL via INTRAVENOUS

## 2016-07-31 MED ORDER — TIMOLOL MALEATE 0.5 % OP SOLN
1.0000 [drp] | Freq: Two times a day (BID) | OPHTHALMIC | Status: DC
  Filled 2016-07-31: qty 5

## 2016-07-31 MED ORDER — ALBUTEROL SULFATE (2.5 MG/3ML) 0.083% IN NEBU: 3.0000 mL | INHALATION_SOLUTION | Freq: Four times a day (QID) | RESPIRATORY_TRACT | Status: DC | PRN

## 2016-07-31 MED ORDER — PREDNISOLONE ACETATE 1 % OP SUSP
1.0000 [drp] | OPHTHALMIC | Status: DC
  Filled 2016-07-31: qty 1

## 2016-07-31 MED ORDER — IOPAMIDOL (ISOVUE-370) INJECTION 76%
INTRAVENOUS | Status: DC | PRN
  Administered 2016-07-31: 60 mL via INTRAVENOUS

## 2016-07-31 MED ORDER — SODIUM CHLORIDE 0.9 % IV SOLN: 250.0000 mL | INTRAVENOUS | Status: DC | PRN

## 2016-07-31 MED ORDER — LACTATED RINGERS IV SOLN
INTRAVENOUS | Status: DC | PRN
  Administered 2016-07-31: 12:00:00 via INTRAVENOUS

## 2016-07-31 MED ORDER — DOCUSATE SODIUM 100 MG PO CAPS: 200.0000 mg | ORAL_CAPSULE | Freq: Two times a day (BID) | ORAL | Status: DC | PRN

## 2016-07-31 MED ORDER — PROTAMINE SULFATE 10 MG/ML IV SOLN
INTRAVENOUS | Status: DC | PRN
  Administered 2016-07-31: 30 mg via INTRAVENOUS

## 2016-07-31 MED ORDER — LORATADINE 10 MG PO TABS
10.0000 mg | ORAL_TABLET | Freq: Every day | ORAL | Status: DC
  Administered 2016-08-01: 09:00:00 10 mg via ORAL
  Filled 2016-07-31: qty 1

## 2016-07-31 MED ORDER — ERYTHROMYCIN 5 MG/GM OP OINT
1.0000 "application " | TOPICAL_OINTMENT | Freq: Every day | OPHTHALMIC | Status: DC
  Filled 2016-07-31: qty 3.5

## 2016-07-31 MED ORDER — ONDANSETRON HCL 4 MG/2ML IJ SOLN: 4.0000 mg | Freq: Four times a day (QID) | INTRAMUSCULAR | Status: DC | PRN

## 2016-07-31 MED ORDER — ROCURONIUM BROMIDE 100 MG/10ML IV SOLN
INTRAVENOUS | Status: DC | PRN
  Administered 2016-07-31: 50 mg via INTRAVENOUS

## 2016-07-31 MED ORDER — FENTANYL CITRATE (PF) 100 MCG/2ML IJ SOLN
INTRAMUSCULAR | Status: DC | PRN
  Administered 2016-07-31: 150 ug via INTRAVENOUS

## 2016-07-31 MED ORDER — TAPENTADOL HCL 50 MG PO TABS: 50.0000 mg | ORAL_TABLET | Freq: Four times a day (QID) | ORAL | Status: DC | PRN

## 2016-07-31 MED ORDER — BRIMONIDINE TARTRATE 0.2 % OP SOLN
1.0000 [drp] | Freq: Two times a day (BID) | OPHTHALMIC | Status: DC
  Filled 2016-07-31: qty 5

## 2016-07-31 MED ORDER — PROPOFOL 10 MG/ML IV BOLUS
INTRAVENOUS | Status: DC | PRN
  Administered 2016-07-31: 130 mg via INTRAVENOUS

## 2016-07-31 MED ORDER — SODIUM CHLORIDE 0.9 % IV SOLN
INTRAVENOUS | Status: AC
  Administered 2016-07-31: 100 mL/h via INTRAVENOUS

## 2016-07-31 MED ORDER — OFF THE BEAT BOOK
Freq: Once | Status: AC
  Administered 2016-07-31: 22:00:00
  Filled 2016-07-31: qty 1

## 2016-07-31 MED ORDER — MONTELUKAST SODIUM 10 MG PO TABS
10.0000 mg | ORAL_TABLET | Freq: Every day | ORAL | Status: DC
  Administered 2016-07-31: 22:00:00 10 mg via ORAL
  Filled 2016-07-31: qty 1

## 2016-07-31 MED ORDER — PHENYLEPHRINE HCL 10 MG/ML IJ SOLN
INTRAMUSCULAR | Status: DC | PRN
  Administered 2016-07-31: 80 ug via INTRAVENOUS

## 2016-07-31 MED ORDER — IOPAMIDOL (ISOVUE-370) INJECTION 76%
INTRAVENOUS | Status: AC
  Filled 2016-07-31: qty 100

## 2016-07-31 MED ORDER — ONDANSETRON 8 MG PO TBDP
8.0000 mg | ORAL_TABLET | Freq: Four times a day (QID) | ORAL | Status: DC | PRN
  Filled 2016-07-31: qty 1

## 2016-07-31 SURGICAL SUPPLY — 15 items
BLANKET WARM UNDERBOD FULL ACC (MISCELLANEOUS) ×3 IMPLANT
CATH DIAG 6FR PIGTAIL (CATHETERS) ×3 IMPLANT
KIT HEART LEFT (KITS) ×3 IMPLANT
NEEDLE BAYLIS TRANSSEPTAL 71CM (NEEDLE) ×3 IMPLANT
PACK CARDIAC CATHETERIZATION (CUSTOM PROCEDURE TRAY) ×3 IMPLANT
PAD DEFIB LIFELINK (PAD) ×3 IMPLANT
SHEATH BAYLIS TORFLEX (SHEATH) ×3 IMPLANT
SHEATH INTRO CHECKFLO 16F 13 (SHEATH) ×1 IMPLANT
SHEATH INTRO CHECKFLO 16F 13CM (SHEATH) ×2
SHEATH PINNACLE 8F 10CM (SHEATH) ×3 IMPLANT
TRANSDUCER W/STOPCOCK (MISCELLANEOUS) ×6 IMPLANT
TUBING CIL FLEX 10 FLL-RA (TUBING) ×3 IMPLANT
WATCHMAN ACCESS DOUBLE CURVE (SHEATH) ×3 IMPLANT
WATCHMAN CLOSURE 33MM (Prosthesis & Implant Heart) ×3 IMPLANT
WIRE AMPLATZ WHISKJ .035X260CM (WIRE) ×3 IMPLANT

## 2016-07-31 NOTE — Progress Notes (Signed)
There are no CPAPs available at this time, they are all in use. RT will set up CPAP for patient once one is available.

## 2016-07-31 NOTE — Progress Notes (Signed)
  Echocardiogram Echocardiogram Transesophageal has been performed.  Leta Jungling M 07/31/2016, 2:35 PM

## 2016-07-31 NOTE — Interval H&P Note (Signed)
History and Physical Interval Note:  07/31/2016 12:39 PM  Douglas Washington  has presented today for surgery, with the diagnosis of afib  The various methods of treatment have been discussed with the patient and family. After consideration of risks, benefits and other options for treatment, the patient has consented to  Procedure(s): LEFT ATRIAL APPENDAGE OCCLUSION (N/A) as a surgical intervention .  The patient's history has been reviewed, patient examined, no change in status, stable for surgery.  I have reviewed the patient's chart and labs.  Questions were answered to the patient's satisfaction.    Pt evaluated. Family at bedside. No changes to add to the note above, except patient has undergone successful lithotripsy. He took his last dose of apixaban last night. Watchman risks, indications reviewed with patient and family. They understand and agree to proceed.  [name]

## 2016-07-31 NOTE — Anesthesia Procedure Notes (Deleted)
Performed by: Lakiah Dhingra T       

## 2016-07-31 NOTE — Anesthesia Procedure Notes (Addendum)
Procedure Name: Intubation Date/Time: 07/31/2016 12:49 PM Performed by: Reine Just Pre-anesthesia Checklist: Patient identified, Emergency Drugs available, Suction available, Patient being monitored and Timeout performed Patient Re-evaluated:Patient Re-evaluated prior to inductionOxygen Delivery Method: Circle system utilized Preoxygenation: Pre-oxygenation with 100% oxygen Intubation Type: IV induction Ventilation: Mask ventilation without difficulty and Oral airway inserted - appropriate to patient size Laryngoscope Size: Mac and 4 Grade View: Grade I Tube type: Oral Tube size: 8.0 mm Number of attempts: 1 Placement Confirmation: ETT inserted through vocal cords under direct vision,  positive ETCO2 and breath sounds checked- equal and bilateral Secured at: 22 cm Tube secured with: Tape Dental Injury: Teeth and Oropharynx as per pre-operative assessment  Comments: Performed by Little Ishikawa CRNA

## 2016-07-31 NOTE — Discharge Summary (Signed)
ELECTROPHYSIOLOGY PROCEDURE DISCHARGE SUMMARY    Patient ID: Douglas Washington,  MRN: [id number], DOB/AGE: Sep 19, 1929 82 y.o.  Admit date: 07/31/2016 Discharge date: 08/01/2016  Primary Care Physician: Douglas Box, MD Primary Cardiologist: Douglas Washington Electrophysiologist: Douglas Range, MD  Primary Discharge Diagnosis:  Persistent atrial fibrillation status post LAA occluder insertion this admission  Secondary Discharge Diagnosis:  1.  Prior SDH 2.  Ventricular tachycardia 3.  Sick sinus syndrome s/p PPM 4.  Hypertension 5.  OSA on CPAP  Procedures This Admission:  1.  Insertion of left atrial appendage occluder (Watchman) on 07/31/16 by Dr Douglas Washington and Dr Douglas Washington. This study demonstrated successful implantation of Watchman LAA occluder with no early apparent complications. TEE at time of procedure demonstrated no leak around device.   Brief HPI: Douglas Washington is a 82 y.o. male with a history of persistent atrial fibrillation. They are felt to not be a candidate for long term Warfarin due to prior SDH. Risks, benefits, and alternatives to Watchman implant were reviewed with the patient who wished to proceed.  The patient underwent TEE prior to the procedure which demonstrated appendage suitable for attempt at [facility] placement.    Hospital Course:  The patient was admitted and underwent Watchman insertion with details as outlined above. PPM was interrogated post procedure with normal device function. They were monitored on telemetry overnight which demonstrated a-pacing, hr 60 bpm.  Groin was without complication on the day of discharge. The patient was examined and considered to be stable for discharge.  He denies chest pain, dyspnea, or other complaints. Bedside echo demonstrated stable device position without pericardial effusion.  Wound care and restrictions were reviewed with the patient.   This patients CHA2DS2-VASc Score is 4.   The patient will be continued on Eliquis at  discharge. Lisinopril 5 mg daily will be held secondary to soft BPs. This can be resumed as an outpatient if BP improves. The patient will be seen in 1 week for groin check, monitor for s/s of bleeding, CBC. 45 day TEE will also be scheduled.   Physical Exam: Vitals:   07/31/16 1930 07/31/16 2000 08/01/16 0411 08/01/16 0800  BP: (!) 100/53 (!) 101/46 (!) 96/50 (!) 105/50  Pulse: (!) 59 (!) 59 60 (!) 30  Resp: 17 (!) 23 18 20   Temp:   97 F (36.1 C) 98.1 F (36.7 C)  TempSrc:   Oral Oral  SpO2: 98% 97% 97% 97%  Weight:   213 lb 13.5 oz (97 kg)   Height:        GEN- The patient is well appearing, alert and oriented x 3 today.   HEENT: normocephalic, atraumatic; sclera clear, conjunctiva pink; hearing intact; oropharynx clear; neck supple Lungs- Clear to ausculation bilaterally, normal work of breathing.  No wheezes, rales, rhonchi Heart- Regular rate and rhythm, no murmurs, rubs or gallops GI- soft, non-tender, non-distended, bowel sounds present Extremities- no clubbing, cyanosis, or edema; DP/PT/radial pulses 2+ bilaterally, groin without hematoma/bruit MS- no significant deformity or atrophy Skin- warm and dry, no rash or lesion Psych- euthymic mood, full affect Neuro- strength and sensation are intact   Labs:   Lab Results  Component Value Date   WBC 4.1 07/31/2016   HGB 11.5 (L) 07/31/2016   HCT 36.4 (L) 07/31/2016   MCV 83.9 07/31/2016   PLT 261 07/31/2016     Recent Labs Lab 08/01/16 0527  NA 135  K 4.2  CL 108  CO2 21*  BUN 16  CREATININE  0.98  CALCIUM 8.8*  GLUCOSE 102*     Discharge Medications:    Medication List    STOP taking these medications   lisinopril 5 MG tablet Commonly known as:  PRINIVIL,ZESTRIL     TAKE these medications   albuterol 108 (90 Base) MCG/ACT inhaler Commonly known as:  PROVENTIL HFA;VENTOLIN HFA Inhale 2 puffs into the lungs every 6 (six) hours as needed for wheezing or shortness of breath.   amiodarone 200 MG  tablet Commonly known as:  PACERONE Take 1 tablet (200 mg total) by mouth daily.   apixaban 5 MG Tabs tablet Commonly known as:  ELIQUIS Take 1 tablet (5 mg total) by mouth 2 (two) times daily.   aspirin EC 81 MG tablet Take 1 tablet (81 mg total) by mouth daily.   brimonidine 0.2 % ophthalmic solution Commonly known as:  ALPHAGAN Place 1 drop into the left eye 2 (two) times daily.   carvedilol 3.125 MG tablet Commonly known as:  COREG Take 1 tablet (3.125 mg total) by mouth 2 (two) times daily with a meal.   cetirizine 10 MG tablet Commonly known as:  ZYRTEC Take 10 mg by mouth daily.   docusate sodium 100 MG capsule Commonly known as:  COLACE Take 2 capsules (200 mg total) by mouth 2 (two) times daily. What changed:  when to take this  reasons to take this   dorzolamide 2 % ophthalmic solution Commonly known as:  TRUSOPT Place 1 drop into both eyes 2 (two) times daily.   erythromycin ophthalmic ointment Place 1 application into both eyes at bedtime. Every other night   GLUCOSAMINE 1500 COMPLEX Caps Take 1 capsule by mouth daily.   Glucosamine HCl 1500 MG Tabs Take 1,500 mg by mouth daily.   latanoprost 0.005 % ophthalmic solution Commonly known as:  XALATAN Place 1 drop into both eyes at bedtime.   MEGARED OMEGA-3 KRILL OIL 500 MG Caps Take 500 mg by mouth daily.   montelukast 10 MG tablet Commonly known as:  SINGULAIR Take 10 mg by mouth at bedtime.   multivitamin with minerals tablet Take 1 tablet by mouth daily.   NUCYNTA 50 MG tablet Generic drug:  tapentadol Take 1 tablet (50 mg total) by mouth every 6 (six) hours as needed for severe pain. 1 TO 2 TABS Q 6 HOURS PRN PAIN What changed:  how much to take  reasons to take this  additional instructions   ondansetron 8 MG disintegrating tablet Commonly known as:  ZOFRAN ODT Take 1 tablet (8 mg total) by mouth every 6 (six) hours as needed for nausea or vomiting.   prednisoLONE acetate 1 %  ophthalmic suspension Commonly known as:  PRED FORTE Place 1 drop into the left eye every other day.   timolol 0.5 % ophthalmic solution Commonly known as:  TIMOPTIC Place 1 drop into the left eye 2 (two) times daily.   UROGESIC-BLUE 81.6 MG Tabs Take 1 tablet (81.6 mg total) by mouth every 6 (six) hours as needed. What changed:  reasons to take this   valACYclovir 500 MG tablet Commonly known as:  VALTREX Take 500 mg by mouth daily.       Disposition:   Follow-up Information    Douglas RangeJames Allred, MD Follow up on 08/07/2016.   Specialty:  Cardiology Why:  at 9:30AM Contact information: [address] [place] [address] [phone]           Duration of Discharge Encounter: Greater than 30  minutes including physician time.  Byrd Hesselbach 08/01/2016 9:24 AM  08/01/2016 9:24 AM  Patient seen, examined. Available data reviewed. Agree with findings, assessment, and plan as outlined by Cline Crock, PA-C. Exam above is my documentation. Right groin site is clear. Will hold lisinopril at discharge because of low BP and fatigue over the last several weeks. Pt states his BP is frequently in the 90's and he doesn't feel well with that. Otherwise medications as outlined. FU arranged as above.   [name], M.D. 08/01/2016 10:08 AM

## 2016-07-31 NOTE — Anesthesia Preprocedure Evaluation (Signed)
Anesthesia Evaluation  Patient identified by MRN, date of birth, ID band Patient awake    Reviewed: Allergy & Precautions, NPO status , Patient's Chart, lab work & pertinent test results  Airway Mallampati: I  TM Distance: >3 FB Neck ROM: Full    Dental   Pulmonary COPD, former smoker,    Pulmonary exam normal        Cardiovascular hypertension, Normal cardiovascular exam+ pacemaker      Neuro/Psych    GI/Hepatic   Endo/Other    Renal/GU Renal disease     Musculoskeletal   Abdominal   Peds  Hematology   Anesthesia Other Findings   Reproductive/Obstetrics                             Anesthesia Physical Anesthesia Plan  ASA: III  Anesthesia Plan: General   Post-op Pain Management:    Induction: Intravenous  Airway Management Planned: Oral ETT  Additional Equipment: Arterial line  Intra-op Plan:   Post-operative Plan: Extubation in OR  Informed Consent: I have reviewed the patients History and Physical, chart, labs and discussed the procedure including the risks, benefits and alternatives for the proposed anesthesia with the patient or authorized representative who has indicated his/her understanding and acceptance.     Plan Discussed with: CRNA and Surgeon  Anesthesia Plan Comments:         Anesthesia Quick Evaluation

## 2016-07-31 NOTE — Transfer of Care (Signed)
Immediate Anesthesia Transfer of Care Note  Patient: Douglas Washington  Procedure(s) Performed: Procedure(s): LEFT ATRIAL APPENDAGE OCCLUSION (N/A)  Patient Location: Cath Lab  Anesthesia Type:General  Level of Consciousness: awake, alert  and oriented  Airway & Oxygen Therapy: Patient Spontanous Breathing and Patient connected to nasal cannula oxygen  Post-op Assessment: Report given to RN, Post -op Vital signs reviewed and stable and Patient moving all extremities  Post vital signs: Reviewed and stable  Last Vitals:  Vitals:   07/31/16 1334 07/31/16 1404  BP:    Pulse: 60 61  Resp:    Temp:      Last Pain:  Vitals:   07/31/16 1007  TempSrc: Oral         Complications: No apparent anesthesia complications

## 2016-07-31 NOTE — H&P (View-Only) (Signed)
Electrophysiology Office Note Date: 07/10/2016  ID:  Caryn Beeoy L Corrales, DOB June 16, 1929, MRN [id number]  PCP: Rozanna BoxBABAOFF, MARC E, MD Primary Cardiologist: Laurena BeringKowalksi Electrophysiologist: Allred  CC: VT follow-up  Caryn Beeoy L Heaton is a 82 y.o. male seen today for Dr Johney FrameAllred.  He presents today for routine electrophysiology followup. Since last being seen in clinic, the patient reports doing reasonably well. His orthostatic intolerance has improved with lower dose BB.  He has been found to have large kidney stone causing obstruction and is in need of surgical clearance for lithotripsy. He is walking twice daily without chest pain or shortness of breath.  He denies chest pain, palpitations, dyspnea, PND, orthopnea, nausea, vomiting, dizziness, syncope, edema, weight gain, or early satiety.    Device History: MDT dual chamber PPM implanted 2011 for sinus bradycardia   Past Medical History:  Diagnosis Date  . Allergy   . COPD (chronic obstructive pulmonary disease) (HCC)   . Hearing loss   . Hypertension   . OSA on CPAP   . Persistent atrial fibrillation (HCC)   . Sebaceous cyst    Upper back  . Sinus bradycardia    s/p MDT PPM   . Traumatic brain injury (HCC) 01/2005  . Ventricular tachycardia (HCC)    05/2016 requiring DCCV - started on amiodarone    Past Surgical History:  Procedure Laterality Date  . CARDIAC CATHETERIZATION N/A 06/06/2016   Procedure: Left Heart Cath and Coronary Angiography;  Surgeon: Kathleene Hazelhristopher D McAlhany, MD;  [facility];  Service: Cardiovascular;  Laterality: N/A;  . CRANIOTOMY  03/2005  . PACEMAKER INSERTION  2011   MDT dual chamber PPM followed by [facility] implanted for sinus bradycardia  . SHOULDER ACROMIOPLASTY Bilateral   . TOTAL HIP ARTHROPLASTY Left 2005    Current Outpatient Prescriptions  Medication Sig Dispense Refill  . albuterol (PROVENTIL HFA;VENTOLIN HFA) 108 (90 BASE) MCG/ACT inhaler Inhale 2 puffs into the lungs every 6 (six) hours as  needed for wheezing or shortness of breath.    Marland Kitchen. amiodarone (PACERONE) 200 MG tablet Take 1 tablet by mouth twice a day X's 2 weeks then go down to 1 tablet by mouth daily 60 tablet 11  . apixaban (ELIQUIS) 5 MG TABS tablet Take 1 tablet (5 mg total) by mouth 2 (two) times daily. 60 tablet 0  . brimonidine (ALPHAGAN) 0.2 % ophthalmic solution Place 1 drop into the left eye 2 (two) times daily.  5  . carvedilol (COREG) 3.125 MG tablet Take 1 tablet (3.125 mg total) by mouth 2 (two) times daily with a meal. 180 tablet 3  . cetirizine (ZYRTEC) 10 MG tablet Take 10 mg by mouth daily.    . dorzolamide (TRUSOPT) 2 % ophthalmic solution Place 1 drop into both eyes 2 (two) times daily.    Marland Kitchen. erythromycin ophthalmic ointment Place 1 application into both eyes every other day. Every other night  3  . latanoprost (XALATAN) 0.005 % ophthalmic solution Place 1 drop into both eyes at bedtime.    Marland Kitchen. lisinopril (PRINIVIL,ZESTRIL) 5 MG tablet Take 5 mg by mouth daily.    Marland Kitchen. MEGARED OMEGA-3 KRILL OIL 500 MG CAPS Take 500 mg by mouth daily.     . montelukast (SINGULAIR) 10 MG tablet Take 10 mg by mouth at bedtime.    . Multiple Vitamins-Minerals (MULTIVITAMIN WITH MINERALS) tablet Take 1 tablet by mouth daily.    . prednisoLONE acetate (PRED FORTE) 1 % ophthalmic suspension Place 1 drop into the  left eye every other day.     . saw palmetto 500 MG capsule Take 500 mg by mouth daily.    . timolol (TIMOPTIC) 0.5 % ophthalmic solution Place 1 drop into the left eye 2 (two) times daily.  4  . valACYclovir (VALTREX) 500 MG tablet Take 500 mg by mouth daily.     No current facility-administered medications for this visit.     Allergies:   Penicillins; Adhesive [tape]; Ciprofloxacin; Oxycodone; Simvastatin; Statins; and Sulfa antibiotics   Social History: Social History   Social History  . Marital status: Married    Spouse name: N/A  . Number of children: N/A  . Years of education: N/A   Occupational History  .  Retired    Social History Main Topics  . Smoking status: Former Smoker    Quit date: 04/14/1974  . Smokeless tobacco: Former Neurosurgeon    Quit date: 04/14/1974  . Alcohol use No  . Drug use: No  . Sexual activity: Not on file   Other Topics Concern  . Not on file   Social History Narrative  . No narrative on file    Family History: Family History  Problem Relation Age of Onset  . Diabetes Mother   . Heart disease Father   . Heart disease Brother      Review of Systems: All other systems reviewed and are otherwise negative except as noted above.   Physical Exam: VS:  BP 108/70   Pulse 79   Ht 5\' 10"  (1.778 m)   Wt 213 lb 6.4 oz (96.8 kg)   SpO2 98%   BMI 30.62 kg/m  , BMI Body mass index is 30.62 kg/m.  GEN- The patient is elderly appearing, alert and oriented x 3 today.   HEENT: normocephalic, atraumatic; sclera clear, conjunctiva pink; hearing intact; oropharynx clear; neck supple Lungs- Clear to ausculation bilaterally, normal work of breathing.  No wheezes, rales, rhonchi Heart- Regular rate and rhythm GI- soft, non-tender, non-distended, bowel sounds present Extremities- no clubbing, cyanosis, or edema; DP/PT/radial pulses 2+ bilaterally MS- no significant deformity or atrophy Skin- warm and dry, no rash or lesion; PPM pocket well healed Psych- euthymic mood, full affect Neuro- strength and sensation are intact  PPM Interrogation- reviewed in detail today,  See PACEART report  EKG:  EKG is not ordered today.  Recent Labs: 06/05/2016: B Natriuretic Peptide 120.5; Magnesium 2.1; TSH 1.339 06/12/2016: ALT 25; BUN 23; Creat 1.03; Hemoglobin 10.1; Platelets 288; Potassium 5.2; Sodium 135   Wt Readings from Last 3 Encounters:  07/10/16 213 lb 6.4 oz (96.8 kg)  06/12/16 218 lb 12.8 oz (99.2 kg)  06/09/16 213 lb 3.2 oz (96.7 kg)     Other studies Reviewed: Additional studies/ records that were reviewed today include: hospital records   Assessment and Plan:  1.   Ventricular tachycardia No recurrence on amiodarone Continue amiodarone 200mg  daily   TSH, LFT's recently normal No driving x6 months (pt aware)  2. Symptomatic bradycardia Normal PPM function See Pace Art report No changes today  3.  Persistent atrial fibrillation Converted to SR with DCCV for VT Started on Eliquis during hospitalization - previously not on OAC 2/2 SDH CHADS2VASC is 4 Will schedule Watchman screening TEE and tentatively plan for 10-19.   4.  HTN Stable No change required today  5.  CAD No targets for revascularization at recent cath Continue medical therapy  No ASA with OAC On BB - decrease dose to 3.125mg  twice daily with  dizziness and hypotension Intolerant of statins   6. Surgical clearance The patient is at moderate risk for lithotripsy with anesthesia. He had recent cath with no disease amenable to PCI.  Would continue BB in the peri-operative period. He is now 4 weeks post cardioversion to SR.  If necessary to hold Eliquis for procedure, ok to do so, but [facility]. Clearance letter faxed today.    Current medicines are reviewed at length with the patient today.   The patient does not have concerns regarding his medicines.  The following changes were made today:  none  Labs/ tests ordered today include: TEE for Watchman screening No orders of the defined types were placed in this encounter.    Disposition:   Follow up with me by phone following TEE   Signed, Gypsy BalsamAmber Aarilyn Dye, NP 07/10/2016 10:25 AM  [facility] [address] [place] [address] [phone] (office) [phone] (fax)

## 2016-07-31 NOTE — CV Procedure (Signed)
TEE was performed intraprocedurally to assist the the placement of a Watchman LAA occluder device. This involves 2D, 3D and color doppler images, and quantitative measurements. Please see the full TEE report for details.  Aracelie Addis C. Kerrigan Gombos, MD, FACC Attending Cardiologist [facility]   

## 2016-07-31 NOTE — Anesthesia Postprocedure Evaluation (Signed)
Anesthesia Post Note  Patient: Caryn Bee  Procedure(s) Performed: Procedure(s) (LRB): LEFT ATRIAL APPENDAGE OCCLUSION (N/A)  Patient location during evaluation: PACU Anesthesia Type: General Level of consciousness: awake and alert Pain management: pain level controlled Vital Signs Assessment: post-procedure vital signs reviewed and stable Respiratory status: spontaneous breathing, nonlabored ventilation, respiratory function stable and patient connected to nasal cannula oxygen Cardiovascular status: blood pressure returned to baseline and stable Postop Assessment: no signs of nausea or vomiting Anesthetic complications: no    Last Vitals:  Vitals:   07/31/16 1800 07/31/16 1830  BP: (!) 119/56 (!) 108/58  Pulse: (!) 59 (!) 59  Resp: (!) 24 17  Temp:      Last Pain:  Vitals:   07/31/16 1706  TempSrc:   PainSc: 0-No pain                 Stepan Verrette DAVID

## 2016-07-31 NOTE — Op Note (Signed)
SURGEON: [name], MD  Assistant: Hillis Range, MD TEE: [place] Hilty, MD  PREPROCEDURE DIAGNOSIS: 1. Persistent Atrial fibrillation 2. Prior subdural bleed   POSTPROCEDURE DIAGNOSIS:  1. Persistent Atrial fibrillation 2. Prior subdural bleed   PROCEDURES:  1. Transseptal puncture 2. Transesophageal echocardiogram 3. Left atrial appendage occlusive device placement.  INTRODUCTION: Douglas Washington is a 82 y.o. male with a history of atrial fibrillation and prior subdural hematoma who presents for left atrial appendage occlusive device placement. his chads2vasc score is 4. he has not been anticoagulated due to prior subdural hematoma, but has been on Eliquis of late. The patient was evaluated by Dr Gwen Pounds and Dr Johney Frame who feel that he is a poor candidate for long term anticoagulation. The patient therefore presents today for watchman device placement.  DESCRIPTION OF PROCEDURE: Informed written consent was obtained, and the patient was brought to the electrophysiology lab in a fasting state. The patient received general anesthesia as outlined in the anesthesia report. TEE was performed which revealed no pericardial effusion or LAA thrombus. Measurements of the atrial appendage OS revealed maximal size of 21 mm x 25 mm.  Using a percutaneous Seldinger technique, an 16-French sheath was placed into the right common femoral vein. An 8.69F SL2 transseptal sheath was advanced through the RCFV into the SVC. Using fluroscopic guidance and TEE, transseptal puncture was performed which the puncture made in the inferior and posterior portion of the intraatrial septum. Transseptal puncture was confirmed by LA pressure measurement, TEE, and contrast injection under fluoroscopy.  Once transseptal access was achieved heparin was administered for adequate anticoagulation during the procedure. The SL2 sheath was exchanged for a [employer] dual curve 61F access system (lot #  [id number]). A 6 F pigtail was introduced through the access guide and advanced under fluoroscopy and TEE guidance into the left atrial appendage. An angiogram was then performed by hand injection of nonionic contrast into the left atrial appendage. The pigtail and access guide were then advanced to the tip of the appendage. The pigtail was then removed and exchanged for a [employer] WATCHMAN 27 mm (LOT # [id number]) device. The device was deplyed and recaptured multiple times, but there was never a complete seal with a residual patent posterior lobe of the LAA. A 33 mm device was then prepped and advanced back through the double curve Watchman access sheath, Lot # [id number].  PASS Criteria: Final position of the WATCHMAN device revealed that it was located at the LAA ostium. Tug test was performed and adequate. Measurements by TEE revealed compression of 12 %. Doppler revealed no leak. Fluoroscopy with angiogram also revealed no leak. The device was deployed into the LAA using a standard technique.  The access system was then removed from the body and the sheaths were aspirated and flushed. The sheaths were removed and hemostasis was assured. EBL<37ml. There were no early apparent complications.   The patient's PPM is interrogated both before and after the procedure and is found to be functioning normally.  CONCLUSIONS:  1.Successful implantation of a WATCHMAN left atrial appendage occlusive device  2. No early apparent complications.   [name] MD 07/31/2016 2:23 PM

## 2016-07-31 NOTE — Progress Notes (Addendum)
Site area: RFV Site Prior to Removal:  Level 0 Pressure Applied For:24min Manual:  yes  Patient Status During Pull: stable  Post Pull Site:  Level 0 Post Pull Instructions Given: yes Post Pull Pulses Present: palpableDressing Applied: tegaderm  Bedrest begins @ 1515 till 2115 Comments:Right arterial line removed. Pressure to site x 13 min. Site level zero. Dressing applied.

## 2016-08-01 ENCOUNTER — Encounter (HOSPITAL_COMMUNITY): Payer: Self-pay | Admitting: Cardiovascular Disease

## 2016-08-01 DIAGNOSIS — I481 Persistent atrial fibrillation: Secondary | ICD-10-CM

## 2016-08-01 LAB — BASIC METABOLIC PANEL
ANION GAP: 6 (ref 5–15)
BUN: 16 mg/dL (ref 6–20)
CALCIUM: 8.8 mg/dL — AB (ref 8.9–10.3)
CHLORIDE: 108 mmol/L (ref 101–111)
CO2: 21 mmol/L — ABNORMAL LOW (ref 22–32)
CREATININE: 0.98 mg/dL (ref 0.61–1.24)
GFR calc non Af Amer: >60 mL/min (ref >60)
Glucose, Bld: 102 mg/dL — ABNORMAL HIGH (ref 65–99)
Potassium: 4.2 mmol/L (ref 3.5–5.1)
SODIUM: 135 mmol/L (ref 135–145)

## 2016-08-01 MED ORDER — AMIODARONE HCL 200 MG PO TABS: 200.0000 mg | ORAL_TABLET | Freq: Every day | ORAL | 11 refills | Status: DC

## 2016-08-01 MED ORDER — ASPIRIN EC 81 MG PO TBEC: 81.0000 mg | DELAYED_RELEASE_TABLET | Freq: Every day | ORAL | Status: DC

## 2016-08-01 NOTE — Discharge Instructions (Signed)

## 2016-08-04 ENCOUNTER — Encounter: Payer: Self-pay | Admitting: Internal Medicine

## 2016-08-06 ENCOUNTER — Encounter: Payer: Self-pay | Admitting: Nurse Practitioner

## 2016-08-07 ENCOUNTER — Telehealth: Payer: Self-pay | Admitting: Internal Medicine

## 2016-08-07 ENCOUNTER — Ambulatory Visit (INDEPENDENT_AMBULATORY_CARE_PROVIDER_SITE_OTHER): Payer: Medicare Other | Admitting: Internal Medicine

## 2016-08-07 ENCOUNTER — Encounter: Payer: Self-pay | Admitting: Internal Medicine

## 2016-08-07 VITALS — BP 103/71 | HR 67 | Ht 70.0 in | Wt 211.0 lb

## 2016-08-07 DIAGNOSIS — I10 Essential (primary) hypertension: Secondary | ICD-10-CM

## 2016-08-07 DIAGNOSIS — I481 Persistent atrial fibrillation: Secondary | ICD-10-CM | POA: Diagnosis not present

## 2016-08-07 DIAGNOSIS — I472 Ventricular tachycardia, unspecified: Secondary | ICD-10-CM

## 2016-08-07 DIAGNOSIS — R001 Bradycardia, unspecified: Secondary | ICD-10-CM

## 2016-08-07 DIAGNOSIS — I4819 Other persistent atrial fibrillation: Secondary | ICD-10-CM

## 2016-08-07 NOTE — Telephone Encounter (Signed)
SPOKE TO PT WIFE AND EXPLAINED THAT I WOULD RESEARCH THIS MATTER TO GET MESSAGE TO SEILER FOR THE INFORMATION TO BE TURNED IN.

## 2016-08-07 NOTE — Telephone Encounter (Signed)
New Message  Pt wife call requesting to speak with RN about the referral that was to be sent to [facility]. Pt wife states they have not received the referral. Please call back to discuss

## 2016-08-07 NOTE — Progress Notes (Signed)
Electrophysiology Office Note Date: 08/07/2016  ID:  Douglas Beeoy L Bakke, DOB 08-22-1929, MRN [id number]  PCP: Rozanna BoxBABAOFF, MARC E, MD Primary Cardiologist: Laurena BeringKowalksi Electrophysiologist: Johney FrameAllred  CC: Watchman follow-up  Douglas Washington is a 82 y.o. male seen today post Watchman implant last week. Since discharge, the patient reports doing reasonably well. He has chronic orthostatic intolerance and reports ongoing issues since Watchman implant. These are largely unchanged since before procedure.  He denies chest pain, palpitations, dyspnea, PND, orthopnea, nausea, vomiting, dizziness, syncope, edema, weight gain, or early satiety.  He has had no bleeding issues or procedural related complications.   Device History: MDT dual chamber PPM implanted 2011 for sinus bradycardia   Past Medical History:  Diagnosis Date  . Allergy   . Chronic kidney disease    Kidney stones  . COPD (chronic obstructive pulmonary disease) (HCC)   . Glaucoma (increased eye pressure)   . Hearing loss   . History of shingles 4 plus years ago   set in left eye  . Hypertension   . OSA on CPAP   . Persistent atrial fibrillation (HCC) 06/05/2016  . Presence of permanent cardiac pacemaker   . Sebaceous cyst    Upper back  . Shortness of breath dyspnea    with exertion  . Sinus bradycardia    s/p MDT PPM   . Traumatic brain injury (HCC) 01/2005  . Ventricular tachycardia (HCC)    05/2016 requiring DCCV - started on amiodarone    Past Surgical History:  Procedure Laterality Date  . APPENDECTOMY    . CARDIAC CATHETERIZATION N/A 06/06/2016   Procedure: Left Heart Cath and Coronary Angiography;  Surgeon: Kathleene Hazelhristopher D McAlhany, MD;  [facility];  Service: Cardiovascular;  Laterality: N/A;  . CRANIOTOMY  03/2005  . CYSTOSCOPY WITH STENT PLACEMENT Left 07/15/2016   Procedure: CYSTOSCOPY WITH STENT PLACEMENT;  Surgeon: Orson ApeMichael R Wolff, MD;  Location: [facility] ORS;  Service: Urology;  Laterality: Left;  . INSERT /  REPLACE / REMOVE PACEMAKER    . JOINT REPLACEMENT    . LEFT ATRIAL APPENDAGE OCCLUSION  07/31/2016  . LEFT ATRIAL APPENDAGE OCCLUSION N/A 07/31/2016   Procedure: LEFT ATRIAL APPENDAGE OCCLUSION;  Surgeon: Tonny BollmanMichael Cooper, MD;  [facility];  Service: Cardiovascular;  Laterality: N/A;  . PACEMAKER INSERTION  2011   MDT dual chamber PPM followed by [employer] implanted for sinus bradycardia  . SHOULDER ACROMIOPLASTY Bilateral   . TEE WITHOUT CARDIOVERSION N/A 07/21/2016   Procedure: TRANSESOPHAGEAL ECHOCARDIOGRAM (TEE);  Surgeon: Chilton Siiffany Wykoff, MD;  [facility];  Service: Cardiovascular;  Laterality: N/A;  . TOTAL HIP ARTHROPLASTY Left 2005  . URETEROSCOPY WITH HOLMIUM LASER LITHOTRIPSY Left 07/15/2016   Procedure: URETEROSCOPY WITH HOLMIUM LASER LITHOTRIPSY;  Surgeon: Orson ApeMichael R Wolff, MD;  Location: [facility] ORS;  Service: Urology;  Laterality: Left;    Current Outpatient Prescriptions  Medication Sig Dispense Refill  . albuterol (PROVENTIL HFA;VENTOLIN HFA) 108 (90 BASE) MCG/ACT inhaler Inhale 2 puffs into the lungs every 6 (six) hours as needed for wheezing or shortness of breath.    Marland Kitchen. amiodarone (PACERONE) 200 MG tablet Take 1 tablet (200 mg total) by mouth daily. 30 tablet 11  . apixaban (ELIQUIS) 5 MG TABS tablet Take 1 tablet (5 mg total) by mouth 2 (two) times daily. 60 tablet 0  . aspirin EC 81 MG tablet Take 1 tablet (81 mg total) by mouth daily.    . brimonidine (ALPHAGAN) 0.2 % ophthalmic solution Place 1  drop into the left eye 2 (two) times daily.  5  . carvedilol (COREG) 3.125 MG tablet Take 1 tablet (3.125 mg total) by mouth 2 (two) times daily with a meal. 180 tablet 3  . cetirizine (ZYRTEC) 10 MG tablet Take 10 mg by mouth daily.    Marland Kitchen docusate sodium (COLACE) 100 MG capsule Take 2 capsules (200 mg total) by mouth 2 (two) times daily. 120 capsule 3  . dorzolamide (TRUSOPT) 2 % ophthalmic solution Place 1 drop into both eyes 2 (two) times daily.    Marland Kitchen  erythromycin ophthalmic ointment Place 1 application into both eyes at bedtime. Every other night  3  . Glucosamine-Chondroit-Vit C-Mn (GLUCOSAMINE 1500 COMPLEX) CAPS Take 1 capsule by mouth daily.    Marland Kitchen latanoprost (XALATAN) 0.005 % ophthalmic solution Place 1 drop into both eyes at bedtime.    Marland Kitchen MEGARED OMEGA-3 KRILL OIL 500 MG CAPS Take 500 mg by mouth daily.     . Methen-Hyosc-Meth Blue-Na Phos (UROGESIC-BLUE) 81.6 MG TABS Take 1 tablet by mouth every 6 (six) hours as needed (for urinary discomfort.).    Marland Kitchen montelukast (SINGULAIR) 10 MG tablet Take 10 mg by mouth at bedtime.    . Multiple Vitamins-Minerals (MULTIVITAMIN WITH MINERALS) tablet Take 1 tablet by mouth daily.    . ondansetron (ZOFRAN ODT) 8 MG disintegrating tablet Take 1 tablet (8 mg total) by mouth every 6 (six) hours as needed for nausea or vomiting. 10 tablet 3  . prednisoLONE acetate (PRED FORTE) 1 % ophthalmic suspension Place 1 drop into the left eye every other day.     . timolol (TIMOPTIC) 0.5 % ophthalmic solution Place 1 drop into the left eye 2 (two) times daily.  4  . valACYclovir (VALTREX) 500 MG tablet Take 500 mg by mouth daily.     No current facility-administered medications for this visit.     Allergies:   Penicillins; Adhesive [tape]; Ciprofloxacin; Oxycodone; Simvastatin; Statins; and Sulfa antibiotics   Social History: Social History   Social History  . Marital status: Married    Spouse name: N/A  . Number of children: N/A  . Years of education: N/A   Occupational History  . Retired    Social History Main Topics  . Smoking status: Former Smoker    Quit date: 04/14/1974  . Smokeless tobacco: Former Neurosurgeon    Quit date: 04/14/1974  . Alcohol use No  . Drug use: No  . Sexual activity: Not on file   Other Topics Concern  . Not on file   Social History Narrative  . No narrative on file    Family History: Family History  Problem Relation Age of Onset  . Diabetes Mother   . Heart disease Father    . Heart disease Brother      Review of Systems: All other systems reviewed and are otherwise negative except as noted above.   Physical Exam: VS:  BP 103/71   Pulse 67   Ht 5\' 10"  (1.778 m)   Wt 211 lb (95.7 kg)   BMI 30.28 kg/m  , BMI Body mass index is 30.28 kg/m.  GEN- The patient is elderly appearing, alert and oriented x 3 today.   HEENT: normocephalic, atraumatic; sclera clear, conjunctiva pink; hearing intact; oropharynx clear; neck supple Lungs- Clear to ausculation bilaterally, normal work of breathing.  No wheezes, rales, rhonchi Heart- Regular rate and rhythm GI- soft, non-tender, non-distended, bowel sounds present Extremities- no clubbing, cyanosis, or edema; DP/PT/radial pulses 2+ bilaterally  MS- no significant deformity or atrophy Skin- warm and dry, no rash or lesion; PPM pocket well healed Psych- euthymic mood, full affect Neuro- strength and sensation are intact, no focal neuro defects  PPM Interrogation- reviewed in detail today,  See PACEART report  EKG:  EKG is not ordered today.  Recent Labs: 06/05/2016: B Natriuretic Peptide 120.5; Magnesium 2.1; TSH 1.339 06/12/2016: ALT 25 07/31/2016: Hemoglobin 11.5; Platelets 261 08/01/2016: BUN 16; Creatinine, Ser 0.98; Potassium 4.2; Sodium 135   Wt Readings from Last 3 Encounters:  08/07/16 211 lb (95.7 kg)  08/01/16 213 lb 13.5 oz (97 kg)  07/21/16 203 lb (92.1 kg)     Other studies Reviewed: Additional studies/ records that were reviewed today include: hospital records   Assessment and Plan:  1.  Ventricular tachycardia No recurrence on amiodarone Continue amiodarone 200mg  daily.  If no recurrence at next follow up, will decrease to 100mg  daily TSH, LFT's recently normal No driving x6 months (pt aware)  2. Symptomatic bradycardia Normal PPM function See Pace Art report No changes today  3.  Persistent atrial fibrillation Converted to SR with DCCV for VT Doing well s/p Watchman Continue  Eliquis and ASA 81mg  daily TEE 6 weeks post procedure to evaluate for device seal/leaks  4.  HTN Stable No change required today  5.  CAD No targets for revascularization at recent cath [facility] of statins    Current medicines are reviewed at length with the patient today.   The patient does not have concerns regarding his medicines.  The following changes were made today:  none  Labs/ tests ordered today include: TEE 6 weeks post Watchman No orders of the defined types were placed in this encounter.    Disposition:   Follow up with EP NP in 6-8 weeks   Signed, Hillis Range, MD  08/07/2016 1:12 PM  [facility] [address] [place] [address] [phone] (office) [phone] (fax)

## 2016-08-07 NOTE — Patient Instructions (Addendum)
Medication Instructions:  Your physician recommends that you continue on your current medications as directed. Please refer to the Current Medication list given to you today.   Labwork: None ordered   Testing/Procedures: Your physician has requested that you have a TEE. During a TEE, sound waves are used to create images of your heart. It provides your doctor with information about the size and shape of your heart and how well your heart's chambers and valves are working. In this test, a transducer is attached to the end of a flexible tube that's guided down your throat and into your esophagus (the tube leading from you mouth to your stomach) to get a more detailed image of your heart. You are not awake for the procedure. Please see the instruction sheet given to you today. For further information please visit [email or web address]  Please check in at [facility] Entrance of [facility] at 9:00am Do not eat or drink after midnight the night before your procedure Okay to take your medications with a small sip of water the morning of the procedure Will need someone to drive you home after the procedure     Follow-Up: Your physician recommends that you schedule a follow-up appointment in: 6 weeks with Gypsy Balsam, NP  Week of 09/15/16

## 2016-08-11 ENCOUNTER — Telehealth: Payer: Self-pay | Admitting: Internal Medicine

## 2016-08-11 NOTE — Telephone Encounter (Signed)
Attempted to call Victorino Dike at [facility] back and was unable to get through the automated VM line.  Automated VM for [facility] kept pushing me back to the main menu.  Will route this message to Dr Johney Frame and RN for further review and follow-up.

## 2016-08-11 NOTE — Telephone Encounter (Signed)
New Message  Douglas Washington voiced from [facility] pt wants to be seen at [facility] regional cardiac rehab program and needing a pre authorization to be seen at a skilled nursing facility.  Please f/u

## 2016-08-13 NOTE — Telephone Encounter (Signed)
Douglas Washington, Please follow-up on this.

## 2016-08-15 NOTE — Telephone Encounter (Signed)
Please refer to cardiac rehab in [facility] [place], [place] 08/15/2016 12:42 PM

## 2016-08-18 ENCOUNTER — Other Ambulatory Visit: Payer: Self-pay | Admitting: *Deleted

## 2016-08-18 DIAGNOSIS — I472 Ventricular tachycardia, unspecified: Secondary | ICD-10-CM

## 2016-08-18 NOTE — Telephone Encounter (Addendum)
SPOKE TO WIFE ABOUT [facility] REFERRAL REQUEST  WAS PUT IN AFTER RECEIVING CORRECT DIAGNOSES REASONING FOR REHAB PER SEILER  AND SOMEONE SHOULD BE CONTACTING THEM FOR FURTHER STEPS FROM [facility]

## 2016-08-26 ENCOUNTER — Other Ambulatory Visit: Payer: Self-pay | Admitting: *Deleted

## 2016-08-26 DIAGNOSIS — I5022 Chronic systolic (congestive) heart failure: Secondary | ICD-10-CM

## 2016-08-27 ENCOUNTER — Telehealth: Payer: Self-pay | Admitting: Internal Medicine

## 2016-08-27 NOTE — Telephone Encounter (Signed)
Pt's wife called to get an update on the cardiac rehab status on pt. Wife states that she called [facility]  this past Monday (11/13). Wife was told that the referral form they received didn't meet the criteria for pt to be enrol for the program.  rehab had send the form back to Dr. Johney Frame for some changes in wording so pt would meet the criteria. Pt's wife is aware that Tresa Endo Dr. Jenel Lucks nurse will be in the office later this week . I will send this message to her for reviewing.

## 2016-08-27 NOTE — Telephone Encounter (Signed)
New message      Calling to get update on cardiac rehab at [facility] regional hosp.  Wife states that the form was sent back to Dr Johney Frame for changes,  Please call and let her know if we received it

## 2016-08-28 NOTE — Telephone Encounter (Signed)
Returned call to wife.  No answer.  I need to find out why Dr Johney Frame referred him to rehab.  This should come from his primary cardiologist. The form has not been recived

## 2016-09-01 ENCOUNTER — Other Ambulatory Visit: Payer: Self-pay | Admitting: *Deleted

## 2016-09-01 ENCOUNTER — Telehealth: Payer: Self-pay | Admitting: *Deleted

## 2016-09-01 DIAGNOSIS — G4733 Obstructive sleep apnea (adult) (pediatric): Secondary | ICD-10-CM

## 2016-09-01 NOTE — Telephone Encounter (Signed)
Attempted to return call from [facility] re: Douglas Washington.   No answer, did not leave voicemail.

## 2016-09-03 ENCOUNTER — Other Ambulatory Visit: Payer: Self-pay | Admitting: *Deleted

## 2016-09-03 DIAGNOSIS — G4733 Obstructive sleep apnea (adult) (pediatric): Secondary | ICD-10-CM

## 2016-09-12 ENCOUNTER — Encounter (HOSPITAL_COMMUNITY)
Admission: RE | Disposition: A | Discharge status: Home / Self Care | Payer: Self-pay | Source: Ambulatory Visit | Attending: Cardiovascular Disease

## 2016-09-12 ENCOUNTER — Ambulatory Visit (HOSPITAL_COMMUNITY)
Admission: RE | Admit: 2016-09-12 | Discharge: 2016-09-12 | Disposition: A | Discharge status: Home / Self Care | Payer: Medicare Other | Source: Ambulatory Visit | Attending: Cardiovascular Disease | Admitting: Cardiovascular Disease

## 2016-09-12 ENCOUNTER — Encounter (HOSPITAL_COMMUNITY): Payer: Self-pay | Admitting: *Deleted

## 2016-09-12 ENCOUNTER — Ambulatory Visit (HOSPITAL_BASED_OUTPATIENT_CLINIC_OR_DEPARTMENT_OTHER): Payer: Medicare Other

## 2016-09-12 DIAGNOSIS — Q211 Atrial septal defect: Secondary | ICD-10-CM | POA: Diagnosis not present

## 2016-09-12 DIAGNOSIS — I351 Nonrheumatic aortic (valve) insufficiency: Secondary | ICD-10-CM | POA: Diagnosis not present

## 2016-09-12 DIAGNOSIS — Z95818 Presence of other cardiac implants and grafts: Secondary | ICD-10-CM | POA: Diagnosis not present

## 2016-09-12 DIAGNOSIS — I083 Combined rheumatic disorders of mitral, aortic and tricuspid valves: Secondary | ICD-10-CM | POA: Insufficient documentation

## 2016-09-12 DIAGNOSIS — J449 Chronic obstructive pulmonary disease, unspecified: Secondary | ICD-10-CM | POA: Insufficient documentation

## 2016-09-12 DIAGNOSIS — I4891 Unspecified atrial fibrillation: Secondary | ICD-10-CM | POA: Diagnosis not present

## 2016-09-12 DIAGNOSIS — I10 Essential (primary) hypertension: Secondary | ICD-10-CM | POA: Diagnosis not present

## 2016-09-12 HISTORY — PX: TEE WITHOUT CARDIOVERSION: SHX5443

## 2016-09-12 SURGERY — ECHOCARDIOGRAM, TRANSESOPHAGEAL
Anesthesia: Moderate Sedation

## 2016-09-12 MED ORDER — FENTANYL CITRATE (PF) 100 MCG/2ML IJ SOLN
INTRAMUSCULAR | Status: DC | PRN
  Administered 2016-09-12 (×2): 25 ug via INTRAVENOUS

## 2016-09-12 MED ORDER — MIDAZOLAM HCL 5 MG/ML IJ SOLN
INTRAMUSCULAR | Status: AC
  Filled 2016-09-12: qty 2

## 2016-09-12 MED ORDER — SODIUM CHLORIDE 0.9 % IV SOLN
INTRAVENOUS | Status: DC
  Administered 2016-09-12: 09:00:00 via INTRAVENOUS

## 2016-09-12 MED ORDER — BUTAMBEN-TETRACAINE-BENZOCAINE 2-2-14 % EX AERO
INHALATION_SPRAY | CUTANEOUS | Status: DC | PRN
  Administered 2016-09-12: 2 via TOPICAL

## 2016-09-12 MED ORDER — FENTANYL CITRATE (PF) 100 MCG/2ML IJ SOLN
INTRAMUSCULAR | Status: AC
  Filled 2016-09-12: qty 2

## 2016-09-12 MED ORDER — MIDAZOLAM HCL 10 MG/2ML IJ SOLN
INTRAMUSCULAR | Status: DC | PRN
  Administered 2016-09-12 (×2): 2 mg via INTRAVENOUS

## 2016-09-12 NOTE — Progress Notes (Signed)
Echocardiogram Echocardiogram Transesophageal has been performed.  Rinoa Garramone N Oluwatoyin Banales 09/12/2016, 10:18 AM 

## 2016-09-12 NOTE — Interval H&P Note (Signed)
History and Physical Interval Note:  09/12/2016 11:47 AM  Douglas Washington  has presented today for surgery, with the diagnosis of AFIB  The various methods of treatment have been discussed with the patient and family. After consideration of risks, benefits and other options for treatment, the patient has consented to  Procedure(s): TRANSESOPHAGEAL ECHOCARDIOGRAM (TEE) (N/A) as a surgical intervention .  The patient's history has been reviewed, patient examined, no change in status, stable for surgery.  I have reviewed the patient's chart and labs.  Questions were answered to the patient's satisfaction.     Charlton Haws

## 2016-09-12 NOTE — CV Procedure (Signed)
During this procedure the patient is administered a total of Versed 4 mg and Fentanyl 50 mg to achieve and maintain moderate conscious sedation.  The patient's heart rate, blood pressure, and oxygen saturation are monitored continuously during the procedure. The period of conscious sedation is 24 minutes, of which I was present face-to-face 100% of this time.  TEE:  33 mm Watchman device in good position with no leak See camtronics for compression measurements Mild MR Moderate TR with pacing wires PFO LAE no thrombus Mild AR No effusion  EF 55%   Charlton Haws

## 2016-09-12 NOTE — H&P (View-Only) (Signed)
Echocardiogram Echocardiogram Transesophageal has been performed.  Douglas Washington 09/12/2016, 10:18 AM

## 2016-09-12 NOTE — Discharge Instructions (Signed)
TEE ° °YOU HAD AN CARDIAC PROCEDURE TODAY: Refer to the procedure report and other information in the discharge instructions given to you for any specific questions about what was found during the examination. If this information does not answer your questions, please call [facility] office at [phone] to clarify.  ° °DIET: Your first meal following the procedure should be a light meal and then it is ok to progress to your normal diet. A half-sandwich or bowl of soup is an example of a good first meal. Heavy or fried foods are harder to digest and may make you feel nauseous or bloated. Drink plenty of fluids but you should avoid alcoholic beverages for 24 hours. If you had a esophageal dilation, please see attached instructions for diet.  ° °ACTIVITY: Your care partner should take you home directly after the procedure. You should plan to take it easy, moving slowly for the rest of the day. You can resume normal activity the day after the procedure however YOU SHOULD NOT DRIVE, use power tools, machinery or perform tasks that involve climbing or major physical exertion for 24 hours (because of the sedation medicines used during the test).  ° °SYMPTOMS TO REPORT IMMEDIATELY: °A cardiologist can be reached at any hour. Please call [phone] for any of the following symptoms:  °Vomiting of blood or coffee ground material  °New, significant abdominal pain  °New, significant chest pain or pain under the shoulder blades  °Painful or persistently difficult swallowing  °New shortness of breath  °Black, tarry-looking or red, bloody stools ° °FOLLOW UP:  °Please also call with any specific questions about appointments or follow up tests. ° ° °Moderate Conscious Sedation, Adult, Care After °These instructions provide you with information about caring for yourself after your procedure. Your health care provider may also give you more specific instructions. Your treatment has been planned according to current medical  practices, but problems sometimes occur. Call your health care provider if you have any problems or questions after your procedure. °What can I expect after the procedure? °After your procedure, it is common: °· To feel sleepy for several hours. °· To feel clumsy and have poor balance for several hours. °· To have poor judgment for several hours. °· To vomit if you eat too soon. °Follow these instructions at home: °For at least 24 hours after the procedure:  °· Do not: °¨ Participate in activities where you could fall or become injured. °¨ Drive. °¨ Use heavy machinery. °¨ Drink alcohol. °¨ Take sleeping pills or medicines that cause drowsiness. °¨ Make important decisions or sign legal documents. °¨ Take care of children on your own. °· Rest. °Eating and drinking °· Follow the diet recommended by your health care provider. °· If you vomit: °¨ Drink water, juice, or soup when you can drink without vomiting. °¨ Make sure you have little or no nausea before eating solid foods. °General instructions °· Have a responsible adult stay with you until you are awake and alert. °· Take over-the-counter and prescription medicines only as told by your health care provider. °· If you smoke, do not smoke without supervision. °· Keep all follow-up visits as told by your health care provider. This is important. °Contact a health care provider if: °· You keep feeling nauseous or you keep vomiting. °· You feel light-headed. °· You develop a rash. °· You have a fever. °Get help right away if: °· You have trouble breathing. °This information is not intended to replace advice given   to you by your health care provider. Make sure you discuss any questions you have with your health care provider. °Document Released: 07/20/2013 Document Revised: 03/03/2016 Document Reviewed: 01/19/2016 °Elsevier Interactive Patient Education © 2017 Elsevier Inc. ° °

## 2016-09-14 ENCOUNTER — Encounter (HOSPITAL_COMMUNITY): Payer: Self-pay | Admitting: Cardiovascular Disease

## 2016-09-16 NOTE — Progress Notes (Signed)
Watchman Follow Up Date: 09/18/2016  ID:  Douglas Washington, DOB 1929/05/11, MRN [id number]  PCP: Rozanna Box, MD Primary Cardiologist: Laurena Bering Electrophysiologist: Allred  CC: 6 week Watchman follow-up  Douglas Washington is a 82 y.o. male seen today for 6 week Watchman follow up. He reports doing very well since last office visit.  He denies chest pain, palpitations, dyspnea, PND, orthopnea, nausea, vomiting, dizziness, syncope, edema, weight gain, or early satiety.  He has had no bleeding issues or procedural related complications.   Device History: MDT dual chamber PPM implanted 2011 for sinus bradycardia   Past Medical History:  Diagnosis Date  . Allergy   . Chronic kidney disease    Kidney stones  . COPD (chronic obstructive pulmonary disease) (HCC)   . Glaucoma (increased eye pressure)   . Hearing loss   . History of shingles 4 plus years ago   set in left eye  . Hypertension   . OSA on CPAP   . Persistent atrial fibrillation (HCC) 06/05/2016  . Presence of permanent cardiac pacemaker   . Sebaceous cyst    Upper back  . Shortness of breath dyspnea    with exertion  . Sinus bradycardia    s/p MDT PPM   . Traumatic brain injury (HCC) 01/2005  . Ventricular tachycardia (HCC)    05/2016 requiring DCCV - started on amiodarone    Past Surgical History:  Procedure Laterality Date  . APPENDECTOMY    . CARDIAC CATHETERIZATION N/A 06/06/2016   Procedure: Left Heart Cath and Coronary Angiography;  Surgeon: Kathleene Hazel, MD;  Location: [facility] INVASIVE CV LAB;  Service: Cardiovascular;  Laterality: N/A;  . CRANIOTOMY  03/2005  . CYSTOSCOPY WITH STENT PLACEMENT Left 07/15/2016   Procedure: CYSTOSCOPY WITH STENT PLACEMENT;  Surgeon: Orson Ape, MD;  Location: [facility] ORS;  Service: Urology;  Laterality: Left;  . INSERT / REPLACE / REMOVE PACEMAKER    . JOINT REPLACEMENT    . LEFT ATRIAL APPENDAGE OCCLUSION  07/31/2016  . LEFT ATRIAL APPENDAGE OCCLUSION N/A 07/31/2016   Procedure: LEFT ATRIAL APPENDAGE OCCLUSION;  Surgeon: [name], MD;  Location: [facility] INVASIVE CV LAB;  Service: Cardiovascular;  Laterality: N/A;  . PACEMAKER INSERTION  2011   MDT dual chamber PPM followed by [facility] implanted for sinus bradycardia  . SHOULDER ACROMIOPLASTY Bilateral   . TEE WITHOUT CARDIOVERSION N/A 07/21/2016   Procedure: TRANSESOPHAGEAL ECHOCARDIOGRAM (TEE);  Surgeon: Chilton Si, MD;  Location: [facility] ENDOSCOPY;  Service: Cardiovascular;  Laterality: N/A;  . TEE WITHOUT CARDIOVERSION N/A 09/12/2016   Procedure: TRANSESOPHAGEAL ECHOCARDIOGRAM (TEE);  Surgeon: Wendall Stade, MD;  Location: [facility] ENDOSCOPY;  Service: Cardiovascular;  Laterality: N/A;  . TOTAL HIP ARTHROPLASTY Left 2005  . URETEROSCOPY WITH HOLMIUM LASER LITHOTRIPSY Left 07/15/2016   Procedure: URETEROSCOPY WITH HOLMIUM LASER LITHOTRIPSY;  Surgeon: Orson Ape, MD;  Location: [facility] ORS;  Service: Urology;  Laterality: Left;    Current Outpatient Prescriptions  Medication Sig Dispense Refill  . albuterol (PROVENTIL HFA;VENTOLIN HFA) 108 (90 BASE) MCG/ACT inhaler Inhale 2 puffs into the lungs every 6 (six) hours as needed for wheezing or shortness of breath.    Marland Kitchen amiodarone (PACERONE) 100 MG tablet Take 2 tablets (200 mg total) by mouth daily. 30 tablet 6  . aspirin EC 81 MG tablet Take 1 tablet (81 mg total) by mouth daily.    . brimonidine (ALPHAGAN) 0.2 % ophthalmic solution Place 1 drop into the left eye 2 (two) times daily.  5  . carvedilol (COREG) 3.125 MG tablet Take 1 tablet (3.125 mg total) by mouth 2 (two) times daily with a meal. 180 tablet 3  . cetirizine (ZYRTEC) 10 MG tablet Take 10 mg by mouth daily.    Marland Kitchen. docusate sodium (COLACE) 100 MG capsule Take 2 capsules (200 mg total) by mouth 2 (two) times daily. 120 capsule 3  . dorzolamide (TRUSOPT) 2 % ophthalmic solution Place 1 drop into both eyes 2 (two) times daily.    Marland Kitchen. erythromycin ophthalmic ointment Place 1 application into both eyes at  bedtime. Every other night  3  . Glucosamine-Chondroit-Vit C-Mn (GLUCOSAMINE 1500 COMPLEX) CAPS Take 1 capsule by mouth daily.    Marland Kitchen. latanoprost (XALATAN) 0.005 % ophthalmic solution Place 1 drop into both eyes at bedtime.    Marland Kitchen. MEGARED OMEGA-3 KRILL OIL 500 MG CAPS Take 500 mg by mouth daily.     . Methen-Hyosc-Meth Blue-Na Phos (UROGESIC-BLUE) 81.6 MG TABS Take 1 tablet by mouth every 6 (six) hours as needed (for urinary discomfort.).    Marland Kitchen. montelukast (SINGULAIR) 10 MG tablet Take 10 mg by mouth at bedtime.    . Multiple Vitamins-Minerals (MULTIVITAMIN WITH MINERALS) tablet Take 1 tablet by mouth daily.    . ondansetron (ZOFRAN ODT) 8 MG disintegrating tablet Take 1 tablet (8 mg total) by mouth every 6 (six) hours as needed for nausea or vomiting. 10 tablet 3  . prednisoLONE acetate (PRED FORTE) 1 % ophthalmic suspension Place 1 drop into the left eye every other day.     . timolol (TIMOPTIC) 0.5 % ophthalmic solution Place 1 drop into the left eye 2 (two) times daily.  4  . valACYclovir (VALTREX) 500 MG tablet Take 500 mg by mouth daily.    . clopidogrel (PLAVIX) 75 MG tablet Take 1 tablet (75 mg total) by mouth daily. 30 tablet 6   No current facility-administered medications for this visit.     Allergies:   Penicillins; Adhesive [tape]; Ciprofloxacin; Oxycodone; Simvastatin; Statins; and Sulfa antibiotics   Social History: Social History   Social History  . Marital status: Married    Spouse name: N/A  . Number of children: N/A  . Years of education: N/A   Occupational History  . Retired    Social History Main Topics  . Smoking status: Former Smoker    Quit date: 04/14/1974  . Smokeless tobacco: Former NeurosurgeonUser    Quit date: 04/14/1974  . Alcohol use No  . Drug use: No  . Sexual activity: Not on file   Other Topics Concern  . Not on file   Social History Narrative  . No narrative on file    Family History: Family History  Problem Relation Age of Onset  . Diabetes Mother     . Heart disease Father   . Heart disease Brother      Review of Systems: All other systems reviewed and are otherwise negative except as noted above.   Physical Exam: VS:  BP 104/62   Pulse 96   Ht 5\' 10"  (1.778 m)   Wt 212 lb (96.2 kg)   BMI 30.42 kg/m  , BMI Body mass index is 30.42 kg/m.  GEN- The patient is elderly appearing, alert and oriented x 3 today.   HEENT: normocephalic, atraumatic; sclera clear, conjunctiva pink; hearing intact; oropharynx clear; neck supple Lungs- Clear to ausculation bilaterally, normal work of breathing.  No wheezes, rales, rhonchi Heart- Regular rate and rhythm GI- soft, non-tender, non-distended, bowel sounds present  Extremities- no clubbing, cyanosis, or edema; DP/PT/radial pulses 2+ bilaterally MS- no significant deformity or atrophy Skin- warm and dry, no rash or lesion; PPM pocket well healed Psych- euthymic mood, full affect Neuro- strength and sensation are intact, no focal neuro defects  PPM Interrogation- reviewed in detail today,  See PACEART report  EKG:  EKG is not ordered today.  Recent Labs: 06/05/2016: B Natriuretic Peptide 120.5; Magnesium 2.1; TSH 1.339 06/12/2016: ALT 25 07/31/2016: Hemoglobin 11.5; Platelets 261 08/01/2016: BUN 16; Creatinine, Ser 0.98; Potassium 4.2; Sodium 135   Wt Readings from Last 3 Encounters:  09/18/16 212 lb (96.2 kg)  09/12/16 211 lb (95.7 kg)  08/07/16 211 lb (95.7 kg)     Other studies Reviewed: Additional studies/ records that were reviewed today include: hospital records   Assessment and Plan:  1.  Ventricular tachycardia No recurrence on amiodarone Decrease amiodarone to 100mg  daily today  TSH, LFT's recently normal No driving x6 months (pt aware)  2. Symptomatic bradycardia Normal PPM function See Pace Art report No changes today  3.  Persistent atrial fibrillation Converted to SR with DCCV for VT Doing well s/p Watchman 6 week TEE showed well seated device with no  leaks. Will stop Eliquis, increase ASA to 325mg  daily and add Plavix 75mg  daily  4.  HTN Stable No change required today  5.  CAD No targets for revascularization at recent cath [facility] of statins    Current medicines are reviewed at length with the patient today.   The patient does not have concerns regarding his medicines.  The following changes were made today:  Stop Eliquis, start plavix 75mg  daily, decrease amiodarone to 100mg  daily   Labs/ tests ordered today include: none No orders of the defined types were placed in this encounter.    Disposition:   Follow up with me in 01/2017   Signed, Gypsy Balsam, NP  09/18/2016 10:19 AM  [facility] [address] [place] [address] [phone] (office) [phone] (fax)

## 2016-09-18 ENCOUNTER — Encounter: Payer: Self-pay | Admitting: Nurse Practitioner

## 2016-09-18 ENCOUNTER — Ambulatory Visit (INDEPENDENT_AMBULATORY_CARE_PROVIDER_SITE_OTHER): Payer: Medicare Other | Admitting: Nurse Practitioner

## 2016-09-18 VITALS — BP 104/62 | HR 96 | Ht 70.0 in | Wt 212.0 lb

## 2016-09-18 DIAGNOSIS — R001 Bradycardia, unspecified: Secondary | ICD-10-CM | POA: Diagnosis not present

## 2016-09-18 DIAGNOSIS — I472 Ventricular tachycardia, unspecified: Secondary | ICD-10-CM

## 2016-09-18 DIAGNOSIS — I481 Persistent atrial fibrillation: Secondary | ICD-10-CM | POA: Diagnosis not present

## 2016-09-18 DIAGNOSIS — I10 Essential (primary) hypertension: Secondary | ICD-10-CM | POA: Diagnosis not present

## 2016-09-18 DIAGNOSIS — I4819 Other persistent atrial fibrillation: Secondary | ICD-10-CM

## 2016-09-18 MED ORDER — AMIODARONE HCL 100 MG PO TABS: 200.0000 mg | ORAL_TABLET | Freq: Every day | ORAL | 6 refills | Status: DC

## 2016-09-18 MED ORDER — CLOPIDOGREL BISULFATE 75 MG PO TABS: 75.0000 mg | ORAL_TABLET | Freq: Every day | ORAL | 6 refills | Status: DC

## 2016-09-18 NOTE — Patient Instructions (Addendum)
Medication Instructions:   STOP TAKING ELIQUIS  START TAKING PLAVIX 75 MG ONCE A DAY   START TAKING AMIODARONE 100 MG ONCE A  DAY    If you need a refill on your cardiac medications before your next appointment, please call your pharmacy.  Labwork: NONE ORDERED  TODAY    Testing/Procedures:  NONE ORDERED  TODAY'   Follow-Up:  WITH AMBER IN APRIL   Any Other Special Instructions Will Be Listed Below (If Applicable).

## 2016-09-19 ENCOUNTER — Telehealth: Payer: Self-pay | Admitting: Pediatrics

## 2016-09-19 MED ORDER — AMIODARONE HCL 100 MG PO TABS: 100.0000 mg | ORAL_TABLET | Freq: Every day | ORAL | 6 refills | Status: DC

## 2016-09-19 NOTE — Addendum Note (Signed)
Addended by: Oleta Mouse on: 09/19/2016 09:06 AM   Modules accepted: Orders

## 2016-09-19 NOTE — Telephone Encounter (Signed)
Please see MyChart message from 09/19/16.  I called pt's daughter, Douglas Washington (on [place]).  I apologized for confusion with amiodarone prescription.  I clarified dose.  She voiced understanding and thanks for the correction.  I called pharmacy to be sure the 200 mg prescription was discontinued and the 100 mg dose was filled.  Pharmacist states rx is correctly filled at this time.

## 2016-09-23 ENCOUNTER — Encounter
Admission: RE | Admit: 2016-09-23 | Discharge: 2016-09-23 | Disposition: A | Discharge status: Home / Self Care | Payer: Medicare Other | Source: Ambulatory Visit | Attending: Urology | Admitting: Urology

## 2016-09-23 DIAGNOSIS — I10 Essential (primary) hypertension: Secondary | ICD-10-CM | POA: Diagnosis not present

## 2016-09-23 LAB — PROTIME-INR
INR: 1.11
PROTHROMBIN TIME: 14.4 s (ref 11.4–15.2)

## 2016-09-24 NOTE — OR Nursing (Signed)
Made contact with [name] in Dr. Dorna Leitz' office via telephone to inquire if they had paper work regarding a cardiac clearance for his pacemaker.  She stated that they did not as she was unaware that he had a pacemaker.  She said to cancel him for tomorrow and she will obtain the required documents and reschedule through the operating room as anesthesia is needed in the litho truck.

## 2016-09-25 ENCOUNTER — Encounter: Admission: RE | Payer: Self-pay | Source: Ambulatory Visit

## 2016-09-25 ENCOUNTER — Encounter: Payer: Self-pay | Admitting: Nurse Practitioner

## 2016-09-25 ENCOUNTER — Ambulatory Visit: Admission: RE | Admit: 2016-09-25 | Payer: Medicare Other | Source: Ambulatory Visit | Admitting: Urology

## 2016-09-25 SURGERY — LITHOTRIPSY, ESWL
Anesthesia: Moderate Sedation | Laterality: Left

## 2016-09-29 ENCOUNTER — Telehealth: Payer: Self-pay | Admitting: *Deleted

## 2016-09-29 NOTE — Telephone Encounter (Signed)
Spoke with [name] at Dr. Amada Jupiter office, advised that patient's PPM is followed by [facility] per our notes.  [name] verbalized understanding, wanted to know if Watchman required clearance.  Advised that it does not require clearance for lithotripsy.  [name] denies additional questions or concerns at this time.

## 2016-09-30 ENCOUNTER — Encounter: Payer: Medicare Other | Attending: Internal Medicine | Admitting: Respiratory Therapy

## 2016-09-30 ENCOUNTER — Encounter: Payer: Self-pay | Admitting: *Deleted

## 2016-09-30 VITALS — Ht 70.0 in | Wt 213.0 lb

## 2016-09-30 DIAGNOSIS — J45909 Unspecified asthma, uncomplicated: Secondary | ICD-10-CM | POA: Diagnosis present

## 2016-09-30 DIAGNOSIS — G4730 Sleep apnea, unspecified: Secondary | ICD-10-CM | POA: Diagnosis not present

## 2016-09-30 DIAGNOSIS — I5020 Unspecified systolic (congestive) heart failure: Secondary | ICD-10-CM | POA: Diagnosis not present

## 2016-09-30 NOTE — Patient Instructions (Signed)
  Your procedure is scheduled on: 10/02/16 Report to Day Surgery. [facility] SECOND FLOOR To find out your arrival time please call [phone] between 1PM - 3PM on 10/01/16  Remember: Instructions that are not followed completely may result in serious medical risk, up to and including death, or upon the discretion of your surgeon and anesthesiologist your surgery may need to be rescheduled.    _X___ 1. Do not eat food or drink liquids after midnight. No gum chewing or hard candies.     ____ 2. No Alcohol for 24 hours before or after surgery.   ____ 3. Do Not Smoke For 24 Hours Prior to Your Surgery.   ____ 4. Bring all medications with you on the day of surgery if instructed.    _X___ 5. Notify your doctor if there is any change in your medical condition     (cold, fever, infections).       Do not wear jewelry, make-up, hairpins, clips or nail polish.  Do not wear lotions, powders, or perfumes. You may wear deodorant.  Do not shave 48 hours prior to surgery. Men may shave face and neck.  Do not bring valuables to the hospital.    [facility] is not responsible for any belongings or valuables.               Contacts, dentures or bridgework may not be worn into surgery.  Leave your suitcase in the car. After surgery it may be brought to your room.  For patients admitted to the hospital, discharge time is determined by your                treatment team.   Patients discharged the day of surgery will not be allowed to drive home.   ___X_ Take these medicines the morning of surgery with A SIP OF WATER:    1.COREG  2.   3.   4.  5.  6.  ____ Fleet Enema (as directed)   ____ Use CHG Soap as directed  __X__ Use inhalers on the day of surgery  ____ Stop metformin 2 days prior to surgery    ____ Take 1/2 of usual insulin dose the night before surgery and none on the morning of surgery.   ____ Stop Coumadin/Plavix/aspirin on  ASPIRIN AND PLAVIX STOPPED 09/23/16  ____  Stop Anti-inflammatories on    __X__ Stop supplements until after surgery.    _X___ Bring C-Pap to the hospital.

## 2016-09-30 NOTE — Patient Instructions (Signed)
Patient Instructions  Patient Details  Name: Douglas Washington MRN: [id number] Date of Birth: 03-Jun-1929 Referring Provider:  Hillis Range, MD  Below are the personal goals you chose as well as exercise and nutrition goals. Our goal is to help you keep on track towards obtaining and maintaining your goals. We will be discussing your progress on these goals with you throughout the program.  Initial Exercise Prescription:     Initial Exercise Prescription - 09/30/16 1200      Date of Initial Exercise RX and Referring Provider   Date 09/30/16     Treadmill   MPH 1.4   Grade 0   Minutes 15   METs 2     NuStep   Level 2   Minutes 15   METs 2     Biostep-RELP   Level 2   Minutes 15   METs 2     Prescription Details   Frequency (times per week) 3   Duration Progress to 45 minutes of aerobic exercise without signs/symptoms of physical distress     Intensity   THRR 40-80% of Max Heartrate 93-120   Ratings of Perceived Exertion 11-13   Perceived Dyspnea 0-4     Resistance Training   Training Prescription Yes   Weight 3   Reps 10-12      Exercise Goals: Frequency: Be able to perform aerobic exercise three times per week working toward 3-5 days per week.  Intensity: Work with a perceived exertion of 11 (fairly light) - 15 (hard) as tolerated. Follow your new exercise prescription and watch for changes in prescription as you progress with the program. Changes will be reviewed with you when they are made.  Duration: You should be able to do 30 minutes of continuous aerobic exercise in addition to a 5 minute warm-up and a 5 minute cool-down routine.  Nutrition Goals: Your personal nutrition goals will be established when you do your nutrition analysis with the dietician.  The following are nutrition guidelines to follow: Cholesterol < 200mg /day Sodium < 1500mg /day Fiber: Men over 50 yrs - 30 grams per day  Personal Goals:     Personal Goals and Risk Factors at Admission -  09/30/16 1213      Core Components/Risk Factors/Patient Goals on Admission   Sedentary Yes  [place] at [place] 68mins/day   Intervention Provide advice, education, support and counseling about physical activity/exercise needs.;Develop an individualized exercise prescription for aerobic and resistive training based on initial evaluation findings, risk stratification, comorbidities and participant's personal goals.   Expected Outcomes Achievement of increased cardiorespiratory fitness and enhanced flexibility, muscular endurance and strength shown through measurements of functional capacity and personal statement of participant.   Increase Strength and Stamina Yes  Home TM and Exercise bike; uses 2-3lb weights 90 reps several days/week   Intervention Provide advice, education, support and counseling about physical activity/exercise needs.;Develop an individualized exercise prescription for aerobic and resistive training based on initial evaluation findings, risk stratification, comorbidities and participant's personal goals.   Expected Outcomes Achievement of increased cardiorespiratory fitness and enhanced flexibility, muscular endurance and strength shown through measurements of functional capacity and personal statement of participant.   Develop more efficient breathing techniques such as purse lipped breathing and diaphragmatic breathing; and practicing self-pacing with activity Yes   Intervention Provide education, demonstration and support about specific breathing techniuqes utilized for more efficient breathing. Include techniques such as pursed lipped breathing, diaphragmatic breathing and self-pacing activity.   Expected Outcomes Short Term: Participant  will be able to demonstrate and use breathing techniques as needed throughout daily activities.   Increase knowledge of respiratory medications and ability to use respiratory devices properly  Yes  ProAir with spacer; CPAP 12cmH2O, nasal  mask   Intervention Provide education and demonstration as needed of appropriate use of medications, inhalers, and oxygen therapy.   Expected Outcomes Short Term: Achieves understanding of medications use. Understands that oxygen is a medication prescribed by physician. Demonstrates appropriate use of inhaler and oxygen therapy.   Lipids Yes   Intervention Provide education and support for participant on nutrition & aerobic/resistive exercise along with prescribed medications to achieve LDL 70mg , HDL >40mg .   Expected Outcomes Short Term: Participant states understanding of desired cholesterol values and is compliant with medications prescribed. Participant is following exercise prescription and nutrition guidelines.;Long Term: Cholesterol controlled with medications as prescribed, with individualized exercise RX and with personalized nutrition plan. Value goals: LDL < 70mg , HDL > 40 mg.      Tobacco Use Initial Evaluation: History  Smoking Status   Former Smoker   Quit date: 04/14/1974  Smokeless Tobacco   Former User   Quit date: 04/14/1974    Copy of goals given to participant.

## 2016-09-30 NOTE — Progress Notes (Signed)
Pulmonary Individual Treatment Plan  Patient Details  Name: Douglas Washington MRN: [id number] Date of Birth: 1928-11-30 Referring Provider:    Initial Encounter Date:  Flowsheet Row Pulmonary Rehab from 09/30/2016 in [facility]  Date  09/30/16      Visit Diagnosis: Asthma, unspecified asthma severity, unspecified whether complicated, unspecified whether persistent  Systolic congestive heart failure, unspecified congestive heart failure chronicity (HCC)  Patient's Home Medications on Admission:  Current Outpatient Prescriptions:    albuterol (PROVENTIL HFA;VENTOLIN HFA) 108 (90 BASE) MCG/ACT inhaler, Inhale 2 puffs into the lungs every 6 (six) hours as needed for wheezing or shortness of breath., Disp: , Rfl:    amiodarone (PACERONE) 100 MG tablet, Take 1 tablet (100 mg total) by mouth daily., Disp: 30 tablet, Rfl: 6   aspirin EC 81 MG tablet, Take 1 tablet (81 mg total) by mouth daily., Disp: , Rfl:    brimonidine (ALPHAGAN) 0.2 % ophthalmic solution, Place 1 drop into the left eye 2 (two) times daily., Disp: , Rfl: 5   carvedilol (COREG) 3.125 MG tablet, Take 1 tablet (3.125 mg total) by mouth 2 (two) times daily with a meal., Disp: 180 tablet, Rfl: 3   cetirizine (ZYRTEC) 10 MG tablet, Take 10 mg by mouth daily., Disp: , Rfl:    clopidogrel (PLAVIX) 75 MG tablet, Take 1 tablet (75 mg total) by mouth daily., Disp: 30 tablet, Rfl: 6   docusate sodium (COLACE) 100 MG capsule, Take 2 capsules (200 mg total) by mouth 2 (two) times daily., Disp: 120 capsule, Rfl: 3   dorzolamide (TRUSOPT) 2 % ophthalmic solution, Place 1 drop into both eyes 2 (two) times daily., Disp: , Rfl:    erythromycin ophthalmic ointment, Place 1 application into both eyes at bedtime. Every other night, Disp: , Rfl: 3   Glucosamine-Chondroit-Vit C-Mn (GLUCOSAMINE 1500 COMPLEX) CAPS, Take 1 capsule by mouth daily., Disp: , Rfl:    latanoprost (XALATAN) 0.005 % ophthalmic solution, Place 1  drop into both eyes at bedtime., Disp: , Rfl:    MEGARED OMEGA-3 KRILL OIL 500 MG CAPS, Take 500 mg by mouth daily. , Disp: , Rfl:    Methen-Hyosc-Meth Blue-Na Phos (UROGESIC-BLUE) 81.6 MG TABS, Take 1 tablet by mouth every 6 (six) hours as needed (for urinary discomfort.)., Disp: , Rfl:    montelukast (SINGULAIR) 10 MG tablet, Take 10 mg by mouth at bedtime., Disp: , Rfl:    Multiple Vitamins-Minerals (MULTIVITAMIN WITH MINERALS) tablet, Take 1 tablet by mouth daily., Disp: , Rfl:    ondansetron (ZOFRAN ODT) 8 MG disintegrating tablet, Take 1 tablet (8 mg total) by mouth every 6 (six) hours as needed for nausea or vomiting., Disp: 10 tablet, Rfl: 3   prednisoLONE acetate (PRED FORTE) 1 % ophthalmic suspension, Place 1 drop into the left eye every other day. , Disp: , Rfl:    timolol (TIMOPTIC) 0.5 % ophthalmic solution, Place 1 drop into the left eye 2 (two) times daily., Disp: , Rfl: 4   valACYclovir (VALTREX) 500 MG tablet, Take 500 mg by mouth daily., Disp: , Rfl:   Past Medical History: Past Medical History:  Diagnosis Date   Allergy    Chronic kidney disease    Kidney stones   COPD (chronic obstructive pulmonary disease) (HCC)    Dysrhythmia    Glaucoma (increased eye pressure)    Hearing loss    History of shingles 4 plus years ago   set in left eye   Hypertension  OSA on CPAP    Persistent atrial fibrillation (HCC) 06/05/2016   Presence of permanent cardiac pacemaker    Sebaceous cyst    Upper back   Shortness of breath dyspnea    with exertion   Sinus bradycardia    s/p MDT PPM    Traumatic brain injury (HCC) 01/2005   Ventricular tachycardia (HCC)    05/2016 requiring DCCV - started on amiodarone     Tobacco Use: History  Smoking Status   Former Smoker   Quit date: 04/14/1974  Smokeless Tobacco   Former User   Quit date: 04/14/1974    Labs: Recent Review Flowsheet Data    Labs for ITP Cardiac and Pulmonary Rehab Latest Ref Rng & Units  06/05/2016 06/05/2016 06/05/2016   Hemoglobin A1c 4.8 - 5.6 % - - 6.2(H)   HCO3 20.0 - 24.0 mEq/L 15.9(L) - -   TCO2 0 - 100 mmol/L 17 18 -   ACIDBASEDEF 0.0 - 2.0 mmol/L 10.0(H) - -   O2SAT % 94.0 - -       ADL UCSD:     Pulmonary Assessment Scores    Row Name 09/30/16 1209         ADL UCSD   ADL Phase Entry     SOB Score total 13     Rest 0     Walk 1     Stairs 2     Bath 0     Dress 1     Shop 1        Pulmonary Function Assessment:     Pulmonary Function Assessment - 09/30/16 1208      Pulmonary Function Tests   FVC% 65 %   FEV1% 79 %   FEV1/FVC Ratio 87.43     Initial Spirometry Results   Comments Test performed 09/30/16     Breath   Bilateral Breath Sounds Clear   Shortness of Breath No      Exercise Target Goals: Date: 09/30/16  Exercise Program Goal: Individual exercise prescription set with THRR, safety & activity barriers. Participant demonstrates ability to understand and report RPE using BORG scale, to self-measure pulse accurately, and to acknowledge the importance of the exercise prescription.  Exercise Prescription Goal: Starting with aerobic activity 30 plus minutes a day, 3 days per week for initial exercise prescription. Provide home exercise prescription and guidelines that participant acknowledges understanding prior to discharge.  Activity Barriers & Risk Stratification:     Activity Barriers & Cardiac Risk Stratification - 09/30/16 1203      Activity Barriers & Cardiac Risk Stratification   Activity Barriers Other (comment)   Comments Pacemaker,  atrial fib   Cardiac Risk Stratification Moderate      6 Minute Walk:     6 Minute Walk    Row Name 09/30/16 1219         6 Minute Walk   Distance 730 feet     Walk Time 6 minutes     # of Rest Breaks 0     MPH 1.38     METS 2     RPE 13     Perceived Dyspnea  3     VO2 Peak 3.85     Symptoms No     Resting HR 64 bpm     Resting BP 118/60     Max Ex. HR 125 bpm      Max Ex. BP 122/60       Interval HR  Baseline HR 64     1 Minute HR 103     2 Minute HR 120     3 Minute HR 115     4 Minute HR 122     5 Minute HR 125     6 Minute HR 116     2 Minute Post HR 99     Interval Heart Rate? Yes       Interval Oxygen   Interval Oxygen? Yes     Baseline Oxygen Saturation % 98 %     1 Minute Oxygen Saturation % 96 %     2 Minute Oxygen Saturation % 98 %     3 Minute Oxygen Saturation % 98 %     4 Minute Oxygen Saturation % 98 %     5 Minute Oxygen Saturation % 98 %     6 Minute Oxygen Saturation % 98 %     2 Minute Post Oxygen Saturation % 97 %        Initial Exercise Prescription:     Initial Exercise Prescription - 09/30/16 1200      Date of Initial Exercise RX and Referring Provider   Date 09/30/16     Treadmill   MPH 1.4   Grade 0   Minutes 15   METs 2     NuStep   Level 2   Minutes 15   METs 2     Biostep-RELP   Level 2   Minutes 15   METs 2     Prescription Details   Frequency (times per week) 3   Duration Progress to 45 minutes of aerobic exercise without signs/symptoms of physical distress     Intensity   THRR 40-80% of Max Heartrate 93-120   Ratings of Perceived Exertion 11-13   Perceived Dyspnea 0-4     Resistance Training   Training Prescription Yes   Weight 3   Reps 10-12      Perform Capillary Blood Glucose checks as needed.  Exercise Prescription Changes:   Exercise Comments:   Discharge Exercise Prescription (Final Exercise Prescription Changes):    Nutrition:  Target Goals: Understanding of nutrition guidelines, daily intake of sodium 1500mg , cholesterol 200mg , calories 30% from fat and 7% or less from saturated fats, daily to have 5 or more servings of fruits and vegetables.  Biometrics:     Pre Biometrics - 09/30/16 1218      Pre Biometrics   Height 5\' 10"  (1.778 m)   Weight 213 lb (96.6 kg)   Waist Circumference 46 inches   Hip Circumference 47 inches   Waist to Hip Ratio 0.98  %   BMI (Calculated) 30.6       Nutrition Therapy Plan and Nutrition Goals:   Nutrition Discharge: Rate Your Plate Scores:   Psychosocial: Target Goals: Acknowledge presence or absence of depression, maximize coping skills, provide positive support system. Participant is able to verbalize types and ability to use techniques and skills needed for reducing stress and depression.  Initial Review & Psychosocial Screening:     Initial Psych Review & Screening - 09/30/16 1217      Family Dynamics   Good Support System? Yes   Comments Mr Artola has good family support from his wife and children. 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  He did loss his one son to kidney cancer in April, but does not seem to be grieving at this time.  Barriers   Psychosocial barriers to participate in program The patient should benefit from training in stress management and relaxation.     Screening Interventions   Interventions Encouraged to exercise;Program counselor consult      Quality of Life Scores:     Quality of Life - 09/30/16 1228      Quality of Life Scores   Health/Function Pre 20.19 %   Socioeconomic Pre 19.94 %   Psych/Spiritual Pre 20.25 %   Family Pre 20.14 %   GLOBAL Pre 21 %      PHQ-9: Recent Review Flowsheet Data    Depression screen [facility] 2/9 09/30/2016   Decreased Interest 0   Down, Depressed, Hopeless 0   PHQ - 2 Score 0   Altered sleeping 0   Tired, decreased energy 1   Change in appetite 0   Feeling bad or failure about yourself  0   Trouble concentrating 0   Moving slowly or fidgety/restless 0   Suicidal thoughts 0   PHQ-9 Score 1      Psychosocial Evaluation and Intervention:   Psychosocial Re-Evaluation:  Education: Education Goals: Education classes will be provided on a weekly basis, covering required topics. Participant will state understanding/return demonstration of topics presented.  Learning  Barriers/Preferences:     Learning Barriers/Preferences - 09/30/16 1208      Learning Barriers/Preferences   Learning Barriers None   Learning Preferences None      Education Topics: Initial Evaluation Education: - Verbal, written and demonstration of respiratory meds, RPE/PD scales, oximetry and breathing techniques. Instruction on use of nebulizers and MDIs: cleaning and proper use, rinsing mouth with steroid doses and importance of monitoring MDI activations. Flowsheet Row Pulmonary Rehab from 09/30/2016 in [facility]  Date  09/30/16  Educator  LB  Instruction Review Code  2- meets goals/outcomes      General Nutrition Guidelines/Fats and Fiber: -Group instruction provided by verbal, written material, models and posters to present the general guidelines for heart healthy nutrition. Gives an explanation and review of dietary fats and fiber.   Controlling Sodium/Reading Food Labels: -Group verbal and written material supporting the discussion of sodium use in heart healthy nutrition. Review and explanation with models, verbal and written materials for utilization of the food label.   Exercise Physiology & Risk Factors: - Group verbal and written instruction with models to review the exercise physiology of the cardiovascular system and associated critical values. Details cardiovascular disease risk factors and the goals associated with each risk factor.   Aerobic Exercise & Resistance Training: - Gives group verbal and written discussion on the health impact of inactivity. On the components of aerobic and resistive training programs and the benefits of this training and how to safely progress through these programs.   Flexibility, Balance, General Exercise Guidelines: - Provides group verbal and written instruction on the benefits of flexibility and balance training programs. Provides general exercise guidelines with specific guidelines to those with heart  or lung disease. Demonstration and skill practice provided.   Stress Management: - Provides group verbal and written instruction about the health risks of elevated stress, cause of high stress, and healthy ways to reduce stress.   Depression: - Provides group verbal and written instruction on the correlation between heart/lung disease and depressed mood, treatment options, and the stigmas associated with seeking treatment.   Exercise & Equipment Safety: - Individual verbal instruction and demonstration of equipment use and safety with use of the equipment.  Infection Prevention: - Provides verbal and written material to individual with discussion of infection control including proper hand washing and proper equipment cleaning during exercise session.   Falls Prevention: - Provides verbal and written material to individual with discussion of falls prevention and safety.   Diabetes: - Individual verbal and written instruction to review signs/symptoms of diabetes, desired ranges of glucose level fasting, after meals and with exercise. Advice that pre and post exercise glucose checks will be done for 3 sessions at entry of program.   Chronic Lung Diseases: - Group verbal and written instruction to review new updates, new respiratory medications, new advancements in procedures and treatments. Provide informative websites and "800" numbers of self-education.   Lung Procedures: - Group verbal and written instruction to describe testing methods done to diagnose lung disease. Review the outcome of test results. Describe the treatment choices: Pulmonary Function Tests, ABGs and oximetry.   Energy Conservation: - Provide group verbal and written instruction for methods to conserve energy, plan and organize activities. Instruct on pacing techniques, use of adaptive equipment and posture/positioning to relieve shortness of breath.   Triggers: - Group verbal and written instruction to  review types of environmental controls: home humidity, furnaces, filters, dust mite/pet prevention, HEPA vacuums. To discuss weather changes, air quality and the benefits of nasal washing.   Exacerbations: - Group verbal and written instruction to provide: warning signs, infection symptoms, calling MD promptly, preventive modes, and value of vaccinations. Review: effective airway clearance, coughing and/or vibration techniques. Create an Sport and exercise psychologistAction Plan.   Oxygen: - Individual and group verbal and written instruction on oxygen therapy. Includes supplement oxygen, available portable oxygen systems, continuous and intermittent flow rates, oxygen safety, concentrators, and Medicare reimbursement for oxygen.   Respiratory Medications: - Group verbal and written instruction to review medications for lung disease. Drug class, frequency, complications, importance of spacers, rinsing mouth after steroid MDI's, and proper cleaning methods for nebulizers. Flowsheet Row Pulmonary Rehab [facility] 09[facility]30/2016 [facility]  Date  09/30/16  Educator  LB  Instruction Review Code  2- meets goals/outcomes      AED/CPR: - Group verbal and written instruction with the use of models to demonstrate the basic use of the AED with the basic ABC's of resuscitation.   Breathing Retraining: - Provides individuals verbal and written instruction on purpose, frequency, and proper technique of diaphragmatic breathing and pursed-lipped breathing. Applies individual practice skills.   Anatomy and Physiology of the Lungs: - Group verbal and written instruction with the use of models to provide basic lung anatomy and physiology related to function, structure and complications of lung disease.   Heart Failure: - Group verbal and written instruction on the basics of heart failure: signs/symptoms, treatments, explanation of ejection fraction, enlarged heart and cardiomyopathy.   Sleep Apnea: - Individual  verbal and written instruction to review Obstructive Sleep Apnea. Review of risk factors, methods for diagnosing and types of masks and machines for OSA.   Anxiety: - Provides group, verbal and written instruction on the correlation between heart/lung disease and anxiety, treatment options, and management of anxiety.   Relaxation: - Provides group, verbal and written instruction about the benefits of relaxation for patients with heart/lung disease. Also provides patients with examples of relaxation techniques.   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   Expected Outcomes Achievement of increased cardiorespiratory fitness and enhanced flexibility, muscular endurance and strength shown through measurements of functional capacity and personal statement of participant.   Increase Strength and Stamina Yes  Home TM and Exercise bike; uses 2-3lb weights 90 reps several days/week   Intervention Provide advice, education, support and counseling about physical activity/exercise needs.;Develop an individualized exercise prescription for aerobic and resistive training based on initial evaluation findings, risk stratification, comorbidities and participant's personal goals.   Expected Outcomes Achievement of increased cardiorespiratory fitness and enhanced flexibility, muscular  endurance and strength shown through measurements of functional capacity and personal statement of participant.   Develop more efficient breathing techniques such as purse lipped breathing and diaphragmatic breathing; and practicing self-pacing with activity Yes   Intervention Provide education, demonstration and support about specific breathing techniuqes utilized for more efficient breathing. Include techniques such as pursed lipped breathing, diaphragmatic breathing and self-pacing activity.   Expected Outcomes Short Term: Participant will be able to demonstrate and use breathing techniques as needed throughout daily activities.   Increase knowledge of respiratory medications and ability to use respiratory devices properly  Yes  ProAir with spacer; CPAP 12cmH2O, nasal mask   Intervention Provide education and demonstration as needed of appropriate use of medications, inhalers, and oxygen therapy.   Expected Outcomes Short Term: Achieves understanding of medications use. Understands that oxygen is a medication prescribed by physician. Demonstrates appropriate use of inhaler and oxygen therapy.   Lipids Yes   Intervention Provide education and support for participant on nutrition & aerobic/resistive exercise along with prescribed medications to achieve LDL 70mg , HDL >40mg .   Expected Outcomes Short Term: Participant states understanding of desired cholesterol values and is compliant with medications prescribed. Participant is following exercise prescription and nutrition guidelines.;Long Term: Cholesterol controlled with medications as prescribed, with individualized exercise RX and with personalized nutrition plan. Value goals: LDL < 70mg , HDL > 40 mg.      Core Components/Risk Factors/Patient Goals Review:    Core Components/Risk Factors/Patient Goals at Discharge (Final Review):    ITP Comments:   Comments: Mr Mancini plans to start [facility] after his kidney procedure on 10/09/16. He will  call when he has clearance from Dr Evelene Croon.

## 2016-10-02 ENCOUNTER — Encounter: Payer: Self-pay | Admitting: *Deleted

## 2016-10-02 ENCOUNTER — Ambulatory Visit: Payer: Medicare Other | Admitting: Anesthesiology

## 2016-10-02 ENCOUNTER — Encounter
Admission: RE | Disposition: A | Discharge status: Home / Self Care | Payer: Self-pay | Source: Ambulatory Visit | Attending: Urology

## 2016-10-02 ENCOUNTER — Ambulatory Visit
Admission: RE | Admit: 2016-10-02 | Discharge: 2016-10-02 | Disposition: A | Discharge status: Home / Self Care | Payer: Medicare Other | Source: Ambulatory Visit | Attending: Urology | Admitting: Urology

## 2016-10-02 DIAGNOSIS — N189 Chronic kidney disease, unspecified: Secondary | ICD-10-CM | POA: Insufficient documentation

## 2016-10-02 DIAGNOSIS — R011 Cardiac murmur, unspecified: Secondary | ICD-10-CM | POA: Diagnosis not present

## 2016-10-02 DIAGNOSIS — Z888 Allergy status to other drugs, medicaments and biological substances status: Secondary | ICD-10-CM | POA: Insufficient documentation

## 2016-10-02 DIAGNOSIS — I080 Rheumatic disorders of both mitral and aortic valves: Secondary | ICD-10-CM | POA: Diagnosis not present

## 2016-10-02 DIAGNOSIS — Z950 Presence of cardiac pacemaker: Secondary | ICD-10-CM | POA: Insufficient documentation

## 2016-10-02 DIAGNOSIS — E669 Obesity, unspecified: Secondary | ICD-10-CM | POA: Diagnosis not present

## 2016-10-02 DIAGNOSIS — I130 Hypertensive heart and chronic kidney disease with heart failure and stage 1 through stage 4 chronic kidney disease, or unspecified chronic kidney disease: Secondary | ICD-10-CM | POA: Diagnosis not present

## 2016-10-02 DIAGNOSIS — I481 Persistent atrial fibrillation: Secondary | ICD-10-CM | POA: Insufficient documentation

## 2016-10-02 DIAGNOSIS — H409 Unspecified glaucoma: Secondary | ICD-10-CM | POA: Diagnosis not present

## 2016-10-02 DIAGNOSIS — Z885 Allergy status to narcotic agent status: Secondary | ICD-10-CM | POA: Diagnosis not present

## 2016-10-02 DIAGNOSIS — Z87442 Personal history of urinary calculi: Secondary | ICD-10-CM | POA: Diagnosis not present

## 2016-10-02 DIAGNOSIS — N200 Calculus of kidney: Secondary | ICD-10-CM | POA: Diagnosis present

## 2016-10-02 DIAGNOSIS — I472 Ventricular tachycardia: Secondary | ICD-10-CM | POA: Diagnosis not present

## 2016-10-02 DIAGNOSIS — Z882 Allergy status to sulfonamides status: Secondary | ICD-10-CM | POA: Diagnosis not present

## 2016-10-02 DIAGNOSIS — I509 Heart failure, unspecified: Secondary | ICD-10-CM | POA: Diagnosis not present

## 2016-10-02 DIAGNOSIS — Z881 Allergy status to other antibiotic agents status: Secondary | ICD-10-CM | POA: Insufficient documentation

## 2016-10-02 DIAGNOSIS — G4733 Obstructive sleep apnea (adult) (pediatric): Secondary | ICD-10-CM | POA: Diagnosis not present

## 2016-10-02 DIAGNOSIS — Z880 Allergy status to penicillin: Secondary | ICD-10-CM | POA: Insufficient documentation

## 2016-10-02 DIAGNOSIS — J449 Chronic obstructive pulmonary disease, unspecified: Secondary | ICD-10-CM | POA: Insufficient documentation

## 2016-10-02 DIAGNOSIS — Z8619 Personal history of other infectious and parasitic diseases: Secondary | ICD-10-CM | POA: Diagnosis not present

## 2016-10-02 DIAGNOSIS — Z87891 Personal history of nicotine dependence: Secondary | ICD-10-CM | POA: Diagnosis not present

## 2016-10-02 DIAGNOSIS — I252 Old myocardial infarction: Secondary | ICD-10-CM | POA: Insufficient documentation

## 2016-10-02 DIAGNOSIS — Z6830 Body mass index (BMI) 30.0-30.9, adult: Secondary | ICD-10-CM | POA: Insufficient documentation

## 2016-10-02 HISTORY — DX: Cardiac arrhythmia, unspecified: I49.9

## 2016-10-02 HISTORY — DX: Heart failure, unspecified: I50.9

## 2016-10-02 HISTORY — PX: EXTRACORPOREAL SHOCK WAVE LITHOTRIPSY: SHX1557

## 2016-10-02 LAB — CBC
HEMATOCRIT: 35.9 % — AB (ref 40.0–52.0)
Hemoglobin: 11.7 g/dL — ABNORMAL LOW (ref 13.0–18.0)
MCH: 27.9 pg (ref 26.0–34.0)
MCHC: 32.5 g/dL (ref 32.0–36.0)
MCV: 85.8 fL (ref 80.0–100.0)
PLATELETS: 258 10*3/uL (ref 150–440)
RBC: 4.18 MIL/uL — AB (ref 4.40–5.90)
RDW: 18.7 % — ABNORMAL HIGH (ref 11.5–14.5)
WBC: 3.9 10*3/uL (ref 3.8–10.6)

## 2016-10-02 LAB — BASIC METABOLIC PANEL
ANION GAP: 4 — AB (ref 5–15)
BUN: 16 mg/dL (ref 6–20)
CO2: 24 mmol/L (ref 22–32)
Calcium: 9.4 mg/dL (ref 8.9–10.3)
Chloride: 111 mmol/L (ref 101–111)
Creatinine, Ser: 1.04 mg/dL (ref 0.61–1.24)
GFR calc Af Amer: >60 mL/min (ref >60)
GLUCOSE: 114 mg/dL — AB (ref 65–99)
POTASSIUM: 4.1 mmol/L (ref 3.5–5.1)
Sodium: 139 mmol/L (ref 135–145)

## 2016-10-02 SURGERY — LITHOTRIPSY, ESWL
Anesthesia: General | Laterality: Left

## 2016-10-02 MED ORDER — FUROSEMIDE 10 MG/ML IJ SOLN
INTRAMUSCULAR | Status: AC
  Administered 2016-10-02: 10 mg via INTRAVENOUS
  Filled 2016-10-02: qty 2

## 2016-10-02 MED ORDER — SODIUM CHLORIDE 0.9 % IV SOLN
INTRAVENOUS | Status: DC
  Administered 2016-10-02: 12:00:00 via INTRAVENOUS

## 2016-10-02 MED ORDER — PROPOFOL 10 MG/ML IV BOLUS
INTRAVENOUS | Status: AC
  Filled 2016-10-02: qty 40

## 2016-10-02 MED ORDER — DIPHENHYDRAMINE HCL 25 MG PO CAPS: 25.0000 mg | ORAL_CAPSULE | ORAL | Status: DC

## 2016-10-02 MED ORDER — MORPHINE SULFATE (PF) 2 MG/ML IV SOLN
10.0000 mg | Freq: Once | INTRAVENOUS | Status: DC
  Filled 2016-10-02: qty 5

## 2016-10-02 MED ORDER — FUROSEMIDE 10 MG/ML IJ SOLN
10.0000 mg | Freq: Once | INTRAMUSCULAR | Status: AC
  Administered 2016-10-02: 10 mg via INTRAVENOUS

## 2016-10-02 MED ORDER — MIDAZOLAM HCL 2 MG/2ML IJ SOLN: 1.0000 mg | Freq: Once | INTRAMUSCULAR | Status: DC

## 2016-10-02 MED ORDER — FAMOTIDINE 20 MG PO TABS
20.0000 mg | ORAL_TABLET | Freq: Once | ORAL | Status: DC
  Administered 2016-10-02: 20 mg via ORAL

## 2016-10-02 MED ORDER — PROMETHAZINE HCL 25 MG/ML IJ SOLN: 25.0000 mg | Freq: Once | INTRAMUSCULAR | Status: DC

## 2016-10-02 MED ORDER — LACTATED RINGERS IV SOLN: INTRAVENOUS | Status: DC

## 2016-10-02 MED ORDER — FENTANYL CITRATE (PF) 100 MCG/2ML IJ SOLN
INTRAMUSCULAR | Status: AC
  Filled 2016-10-02: qty 2

## 2016-10-02 MED ORDER — DEXTROSE-NACL 5-0.45 % IV SOLN: INTRAVENOUS | Status: DC

## 2016-10-02 MED ORDER — GENTAMICIN IN SALINE 1.6-0.9 MG/ML-% IV SOLN
80.0000 mg | INTRAVENOUS | Status: DC
  Filled 2016-10-02: qty 50

## 2016-10-02 NOTE — Anesthesia Postprocedure Evaluation (Signed)
Anesthesia Post Note  Patient: Douglas Washington  Procedure(s) Performed: Procedure(s) (LRB): EXTRACORPOREAL SHOCK WAVE LITHOTRIPSY (ESWL) (Left)  Patient location during evaluation: PACU Anesthesia Type: General Level of consciousness: awake and alert and oriented Pain management: pain level controlled Vital Signs Assessment: post-procedure vital signs reviewed and stable Respiratory status: spontaneous breathing, nonlabored ventilation and respiratory function stable Cardiovascular status: blood pressure returned to baseline and stable Postop Assessment: no signs of nausea or vomiting Anesthetic complications: no     Last Vitals:  Vitals:   10/02/16 1133  BP: (!) 124/56  Pulse: 70  Resp: 16  Temp: (!) 35.7 C    Last Pain:  Vitals:   10/02/16 1133  TempSrc: Tympanic                 Mayme Profeta

## 2016-10-02 NOTE — Anesthesia Preprocedure Evaluation (Signed)
Anesthesia Evaluation  Patient identified by MRN, date of birth, ID band Patient awake    Reviewed: Allergy & Precautions, NPO status , Patient's Chart, lab work & pertinent test results  History of Anesthesia Complications Negative for: history of anesthetic complications  Airway Mallampati: II  TM Distance: >3 FB Neck ROM: Full    Dental no notable dental hx.    Pulmonary sleep apnea and Continuous Positive Airway Pressure Ventilation , COPD,  COPD inhaler, former smoker,    breath sounds clear to auscultation- rhonchi (-) wheezing      Cardiovascular hypertension, Pt. on medications +CHF  (-) CAD and (-) Past MI + dysrhythmias + pacemaker  Rhythm:Regular Rate:Normal - Systolic murmurs and - Diastolic murmurs Echo 09/12/16: - Left ventricle: Systolic function was normal. The estimated   ejection fraction was in the range of 55% to 60%. - Aortic valve: There was mild regurgitation. - Mitral valve: There was mild regurgitation. - Left atrium: The atrium was dilated. No evidence of thrombus in   the atrial cavity or appendage. - Right atrium: The atrium was dilated. - Atrial septum: There was a patent foramen ovale. - Tricuspid valve: There was moderate regurgitation. - Impressions: 33 mm Watchman device in good position with no leak   Compressed shoulder diameter as follows  Cardiac Cath 06/06/16:  Mid RCA lesion, 50 %stenosed.  Mid RCA to Dist RCA lesion, 10 %stenosed.  Prox RCA lesion, 20 %stenosed.  1st Mrg-2 lesion, 90 %stenosed.  1st Mrg-1 lesion, 30 %stenosed.  Mid LAD to Dist LAD lesion, 50 %stenosed.  Dist LAD lesion, 90 %stenosed.  Ost LAD to Mid LAD lesion, 20 %stenosed.   Neuro/Psych negative neurological ROS  negative psych ROS   GI/Hepatic negative GI ROS, Neg liver ROS,   Endo/Other  negative endocrine ROSneg diabetes  Renal/GU CRFRenal disease     Musculoskeletal negative musculoskeletal  ROS (+)   Abdominal (+) + obese,   Peds  Hematology negative hematology ROS (+)   Anesthesia Other Findings Past Medical History: No date: Allergy No date: CHF (congestive heart failure) (HCC)     Comment: 10/17 WATCHMAN FILTER IMPLANTED No date: Chronic kidney disease     Comment: Kidney stones No date: COPD (chronic obstructive pulmonary disease) (* No date: Dysrhythmia     Comment: WATCHMAN FILTER 10/17 No date: Glaucoma (increased eye pressure) No date: Hearing loss 4 plus years ago: History of shingles     Comment: set in left eye No date: Hypertension No date: OSA on CPAP 06/05/2016: Persistent atrial fibrillation (HCC) No date: Presence of permanent cardiac pacemaker     Comment: 2010 No date: Sebaceous cyst     Comment: Upper back No date: Shortness of breath dyspnea     Comment: with exertion No date: Sinus bradycardia     Comment: s/p MDT PPM  01/2005: Traumatic brain injury (HCC) No date: Ventricular tachycardia (HCC)     Comment: 05/2016 requiring DCCV - started on amiodarone    Reproductive/Obstetrics                             Anesthesia Physical Anesthesia Plan  ASA: III  Anesthesia Plan: General   Post-op Pain Management:    Induction: Intravenous  Airway Management Planned: Natural Airway  Additional Equipment:   Intra-op Plan:   Post-operative Plan:   Informed Consent: I have reviewed the patients History and Physical, chart, labs and discussed the procedure including the risks,  benefits and alternatives for the proposed anesthesia with the patient or authorized representative who has indicated his/her understanding and acceptance.   Dental advisory given  Plan Discussed with: CRNA and Anesthesiologist  Anesthesia Plan Comments:         Anesthesia Quick Evaluation

## 2016-10-02 NOTE — Consult Note (Signed)
[facility]  Patient ID: Douglas Washington, MRN: [id number], DOB/AGE: 12-03-1928 82 y.o. Admit date: 10/02/2016   Date of Consult: 10/02/2016 Primary Physician: Rozanna BoxBABAOFF, MARC E, MD Primary Cardiologist: Gwen PoundsKowalski  Chief Complaint: No chief complaint on file. Tachycardia Reason for Consult: irregular heart beat and tachycardia  HPI: 82 y.o. male with the known systolic dysfunction congestive heart failure sleep apnea persistent nonvalvular atrial fibrillation status post sick sinus syndrome with pacemaker placement chronic kidney disease and COPD with recent significant kidney stones requiring lithotripsy. The patient has done fairly well from the cardiovascular standpoint on appropriate medication management including amiodarone for wide-complex tachycardia now stable. Additionally the patient has had recent pacemaker evaluation showing no evidence of significant issues with pacemaker function. The patient was under anesthesia for a lithotripsy 1 he had some wide complex tachycardia which was short-lived as well as what appears to be possibly preventricular contractions. There is no full documentation of these rhythms although he is on appropriate medications for this. He has had no evidence of symptoms of heart failure and no evidence of chest pain or shortness of breath at this time. In his recovery he is been on telemetry with no evidence of significant rhythm disturbances at this time. Pacemaker interrogation today shows normal pacemaker function and no rhythm disturbances  Past Medical History:  Diagnosis Date  . Allergy   . CHF (congestive heart failure) (HCC)    10/17 WATCHMAN FILTER IMPLANTED  . Chronic kidney disease    Kidney stones  . COPD (chronic obstructive pulmonary disease) (HCC)   . Dysrhythmia    WATCHMAN FILTER 10/17  . Glaucoma (increased eye pressure)   . Hearing loss   . History of shingles 4 plus years ago   set in left eye  . Hypertension    . OSA on CPAP   . Persistent atrial fibrillation (HCC) 06/05/2016  . Presence of permanent cardiac pacemaker    2010  . Sebaceous cyst    Upper back  . Shortness of breath dyspnea    with exertion  . Sinus bradycardia    s/p MDT PPM   . Traumatic brain injury (HCC) 01/2005  . Ventricular tachycardia (HCC)    05/2016 requiring DCCV - started on amiodarone       Surgical History:  Past Surgical History:  Procedure Laterality Date  . APPENDECTOMY    . CARDIAC CATHETERIZATION N/A 06/06/2016   Procedure: Left Heart Cath and Coronary Angiography;  Surgeon: Kathleene Hazelhristopher D McAlhany, MD;  [facility];  Service: Cardiovascular;  Laterality: N/A;  . CRANIOTOMY  03/2005  . CYSTOSCOPY WITH STENT PLACEMENT Left 07/15/2016   Procedure: CYSTOSCOPY WITH STENT PLACEMENT;  Surgeon: Orson ApeMichael R Wolff, MD;  Location: [facility] ORS;  Service: Urology;  Laterality: Left;  . INSERT / REPLACE / REMOVE PACEMAKER    . JOINT REPLACEMENT    . LEFT ATRIAL APPENDAGE OCCLUSION  07/31/2016  . LEFT ATRIAL APPENDAGE OCCLUSION N/A 07/31/2016   Procedure: LEFT ATRIAL APPENDAGE OCCLUSION;  Surgeon: Tonny BollmanMichael Cooper, MD;  [facility];  Service: Cardiovascular;  Laterality: N/A;  . PACEMAKER INSERTION  2011   MDT dual chamber PPM followed by [facility] implanted for sinus bradycardia  . SHOULDER ACROMIOPLASTY Bilateral   . TEE WITHOUT CARDIOVERSION N/A 07/21/2016   Procedure: TRANSESOPHAGEAL ECHOCARDIOGRAM (TEE);  Surgeon: Chilton Siiffany [place], MD;  [facility];  Service: Cardiovascular;  Laterality: N/A;  . TEE WITHOUT CARDIOVERSION N/A 09/12/2016   Procedure: TRANSESOPHAGEAL ECHOCARDIOGRAM (TEE);  Surgeon: Wendall Stade, MD;  Location: [facility] ENDOSCOPY;  Service: Cardiovascular;  Laterality: N/A;  . TOTAL HIP ARTHROPLASTY Left 2005  . URETEROSCOPY WITH HOLMIUM LASER LITHOTRIPSY Left 07/15/2016   Procedure: URETEROSCOPY WITH HOLMIUM LASER LITHOTRIPSY;  Surgeon: Orson Ape, MD;  Location: [facility]  ORS;  Service: Urology;  Laterality: Left;     Home Meds: Prior to Admission medications   Medication Sig Start Date End Date Taking? Authorizing Provider  albuterol (PROVENTIL HFA;VENTOLIN HFA) 108 (90 BASE) MCG/ACT inhaler Inhale 2 puffs into the lungs every 6 (six) hours as needed for wheezing or shortness of breath.   Yes Historical Provider, MD  amiodarone (PACERONE) 100 MG tablet Take 1 tablet (100 mg total) by mouth daily. 09/19/16  Yes Amber Caryl Bis, NP  brimonidine (ALPHAGAN) 0.2 % ophthalmic solution Place 1 drop into the left eye 2 (two) times daily. 04/27/15  Yes Historical Provider, MD  carvedilol (COREG) 3.125 MG tablet Take 1 tablet (3.125 mg total) by mouth 2 (two) times daily with a meal. 07/10/16  Yes Amber Caryl Bis, NP  cetirizine (ZYRTEC) 10 MG tablet Take 10 mg by mouth daily.   Yes Historical Provider, MD  docusate sodium (COLACE) 100 MG capsule Take 2 capsules (200 mg total) by mouth 2 (two) times daily. 07/15/16  Yes Orson Ape, MD  dorzolamide (TRUSOPT) 2 % ophthalmic solution Place 1 drop into both eyes 2 (two) times daily. 04/30/15  Yes Historical Provider, MD  erythromycin ophthalmic ointment Place 1 application into both eyes at bedtime. Every other night 04/03/15  Yes Historical Provider, MD  Glucosamine-Chondroit-Vit C-Mn (GLUCOSAMINE 1500 COMPLEX) CAPS Take 1 capsule by mouth daily.   Yes Historical Provider, MD  latanoprost (XALATAN) 0.005 % ophthalmic solution Place 1 drop into both eyes at bedtime. 02/24/15  Yes Historical Provider, MD  MEGARED OMEGA-3 KRILL OIL 500 MG CAPS Take 500 mg by mouth daily.    Yes Historical Provider, MD  montelukast (SINGULAIR) 10 MG tablet Take 10 mg by mouth at bedtime.   Yes Historical Provider, MD  Multiple Vitamins-Minerals (MULTIVITAMIN WITH MINERALS) tablet Take 1 tablet by mouth daily.   Yes Historical Provider, MD  prednisoLONE acetate (PRED FORTE) 1 % ophthalmic suspension Place 1 drop into the left eye every other day.   05/15/15  Yes Historical Provider, MD  timolol (TIMOPTIC) 0.5 % ophthalmic solution Place 1 drop into the left eye 2 (two) times daily. 04/03/15  Yes Historical Provider, MD  valACYclovir (VALTREX) 500 MG tablet Take 500 mg by mouth daily. 05/08/16  Yes Historical Provider, MD  ondansetron (ZOFRAN ODT) 8 MG disintegrating tablet Take 1 tablet (8 mg total) by mouth every 6 (six) hours as needed for nausea or vomiting. 07/15/16   Orson Ape, MD    Inpatient Medications:  . diphenhydrAMINE  25 mg Oral On Call  . gentamicin  80 mg Intravenous On Call to OR  . midazolam  1 mg Intramuscular Once  . morphine  10 mg Intramuscular Once  . promethazine  25 mg Intramuscular Once   . sodium chloride 100 mL/hr at 10/02/16 1143  . lactated ringers      Allergies:  Allergies  Allergen Reactions  . Penicillins Anaphylaxis and Swelling    Has patient had a PCN reaction causing immediate rash, facial/tongue/throat swelling, SOB or lightheadedness with hypotension: Yes Has patient had a PCN reaction causing severe rash involving mucus membranes or skin necrosis: No Has patient had a PCN reaction that required hospitalization No  Has patient had a PCN reaction occurring within the last 10 years: No If all of the above answers are "NO", then may proceed with Cephalosporin use.   . Adhesive [Tape] Other (See Comments)    Tears skin  . Ciprofloxacin Other (See Comments)    Unknown  . Oxycodone Other (See Comments)    Hallucinations  . Simvastatin Other (See Comments)    Weakness  . Statins Other (See Comments)    Weakness, couldn't walk  . Sulfa Antibiotics Swelling    Social History   Social History  . Marital status: Married    Spouse name: N/A  . Number of children: N/A  . Years of education: N/A   Occupational History  . Retired    Social History Main Topics  . Smoking status: Former Smoker    Quit date: 04/14/1974  . Smokeless tobacco: Former Neurosurgeon    Quit date: 04/14/1974  . Alcohol  use No  . Drug use: No  . Sexual activity: Not on file   Other Topics Concern  . Not on file   Social History Narrative  . No narrative on file     Family History  Problem Relation Age of Onset  . Diabetes Mother   . Heart disease Father   . Heart disease Brother      Review of Systems Positive for Urinary frequency Negative for: General:  chills, fever, night sweats or weight changes.  Cardiovascular: PND orthopnea syncope dizziness  Dermatological skin lesions rashes Respiratory: Cough congestion Urologic: Positive for Frequent urination urination at night and hematuria Abdominal: negative for nausea, vomiting, diarrhea, bright red blood per rectum, melena, or hematemesis Neurologic: negative for visual changes, and/or hearing changes  All other systems reviewed and are otherwise negative except as noted above.  Labs: No results for input(s): CKTOTAL, CKMB, TROPONINI in the last 72 hours. Lab Results  Component Value Date   WBC 3.9 10/02/2016   HGB 11.7 (L) 10/02/2016   HCT 35.9 (L) 10/02/2016   MCV 85.8 10/02/2016   PLT 258 10/02/2016    Recent Labs Lab 10/02/16 1106  NA 139  K 4.1  CL 111  CO2 24  BUN 16  CREATININE 1.04  CALCIUM 9.4  GLUCOSE 114*   No results found for: CHOL, HDL, LDLCALC, TRIG No results found for: DDIMER  Radiology/Studies:  No results found.  EKG: Paced rhythm  Weights: Filed Weights   10/02/16 1133  Weight: 96.6 kg (213 lb)     Physical Exam: Blood pressure (!) 135/93, pulse 75, temperature 97.4 F (36.3 C), resp. rate 14, height 5\' 10"  (1.778 m), weight 96.6 kg (213 lb), SpO2 99 %. Body mass index is 30.56 kg/m. General: Well developed, well nourished, in no acute distress. Head eyes ears nose throat: Normocephalic, atraumatic, sclera non-icteric, no xanthomas, nares are without discharge. No apparent thyromegaly and/or mass  Lungs: Normal respiratory effort.  no wheezes, no rales, no rhonchi.  Heart: RRR with normal  S1 S2. no murmur gallop, no rub, PMI is normal size and placement, carotid upstroke normal without bruit, jugular venous pressure is normal Abdomen: Soft, non-tender, non-distended with normoactive bowel sounds. No hepatomegaly. No rebound/guarding. No obvious abdominal masses. Abdominal aorta is normal size without bruit Extremities: No edema. no cyanosis, no clubbing, no ulcers  Peripheral : 2+ bilateral upper extremity pulses, 2+ bilateral femoral pulses, 2+ bilateral dorsal pedal pulse Neuro: Alert and oriented. No facial asymmetry. No focal deficit. Moves all extremities spontaneously. Musculoskeletal: Normal muscle tone  without kyphosis Psych:  Responds to questions appropriately with a normal affect.    Assessment: 82 year old male with chronic systolic dysfunction essential hypertension mixed hyperlipidemia persistent atrial fibrillation with history of ventricular tachycardia on appropriate medication management without evidence of just of heart failure or myocardial infarction with some irregular heartbeats and no significant consequences in recovery from lithotripsy  Plan: 1. Patient may be discharged home with ambulation and follow up in 2 weeks 2. Continue amiodarone at low dose for ventricular tachycardia currently controlled 3. Plavix and aspirin for further risk reduction of thrombosis for 3 more 1 months after watchman device placed 4. No further cardiac workup at this time for irregular heartbeat not reoccurring 5. Continue aggressive CPAP machine use every evening for further risk reduction of cardiomyopathy and heart failure 6. No further cardiac diagnostics necessary at this time  Signed, Lamar Blinks M.D. [facility] 10/02/2016, 5:48 PM

## 2016-10-02 NOTE — Anesthesia Postprocedure Evaluation (Deleted)
Anesthesia Post Note  Patient: Douglas Washington  Procedure(s) Performed: Procedure(s) (LRB): EXTRACORPOREAL SHOCK WAVE LITHOTRIPSY (ESWL) (Left)  Anesthesia Post Evaluation   Last Vitals:  Vitals:   10/02/16 1133  BP: (!) 124/56  Pulse: 70  Resp: 16  Temp: (!) 35.7 C    Last Pain:  Vitals:   10/02/16 1133  TempSrc: Tympanic                 Kaylise Blakeley

## 2016-10-02 NOTE — Transfer of Care (Signed)
Immediate Anesthesia Transfer of Care Note  Patient: Douglas Washington  Procedure(s) Performed: Procedure(s): EXTRACORPOREAL SHOCK WAVE LITHOTRIPSY (ESWL) (Left)  Patient Location: PACU  Anesthesia Type:General  Level of Consciousness: awake and alert   Airway & Oxygen Therapy: Patient Spontanous Breathing  Post-op Assessment: Report given to RN and Post -op Vital signs reviewed and stable  Post vital signs: Reviewed and stable  Last Vitals:  Vitals:   10/02/16 1133  BP: (!) 124/56  Pulse: 70  Resp: 16  Temp: (!) 35.7 C    Last Pain:  Vitals:   10/02/16 1133  TempSrc: Tympanic         Complications: No apparent anesthesia complications

## 2016-10-02 NOTE — Discharge Instructions (Addendum)
AMBULATORY SURGERY  DISCHARGE INSTRUCTIONS   1) The drugs that you were given will stay in your system until tomorrow so for the next 24 hours you should not:  A) Drive an automobile B) Make any legal decisions C) Drink any alcoholic beverage   2) You may resume regular meals tomorrow.  Today it is better to start with liquids and gradually work up to solid foods.  You may eat anything you prefer, but it is better to start with liquids, then soup and crackers, and gradually work up to solid foods.   3) Please notify your doctor immediately if you have any unusual bleeding, trouble breathing, redness and pain at the surgery site, drainage, fever, or pain not relieved by medication.   4) Additional Instructions:   Please contact your physician with any problems or Same Day Surgery at [phone], Monday through Friday 6 am to 4 pm, or [place] at [facility] Main number at [phone].  Kidney Stones Kidney stones (urolithiasis) are solid, rock-like deposits that form inside of the organs that make urine (kidneys). A kidney stone may form in a kidney and move into the bladder, where it can cause intense pain and block the flow of urine. Kidney stones are created when high levels of certain minerals are found in the urine. They are usually passed through urination, but in some cases, medical treatment may be needed to remove them. What are the causes? Kidney stones may be caused by:  A condition in which certain glands produce too much parathyroid hormone (primary hyperparathyroidism), which causes too much calcium buildup in the blood.  Buildup of uric acid crystals in the bladder (hyperuricosuria). Uric acid is a chemical that the body produces when you eat certain foods. It usually exits the body in the urine.  Narrowing (stricture) of one or both of the tubes that drain urine from the kidneys to the bladder (ureters).  A kidney blockage that is present at birth (congenital  obstruction).  Past surgery on the kidney or the ureters, such as gastric bypass surgery. What increases the risk? The following factors make you more likely to develop kidney stones:  Having had a kidney stone in the past.  Having a family history of kidney stones.  Not drinking enough water.  Eating a diet that is high in protein, salt (sodium), or sugar.  Being overweight or obese. What are the signs or symptoms? Symptoms of a kidney stone may include:  Nausea.  Vomiting.  Blood in the urine (hematuria).  Pain in the side of the abdomen, right below the ribs (flank pain). Pain usually spreads (radiates) to the groin.  Needing to urinate frequently or urgently. How is this diagnosed? This condition may be diagnosed based on:  Your medical history.  A physical exam.  Blood tests.  Urine tests.  CT scan.  Abdominal X-ray.  A procedure to examine the inside of the bladder (cystoscopy). How is this treated? Treatment for kidney stones depends on the size, location, and makeup of the stones. Treatment may involve:  Analyzing your urine before and after you pass the stone through urination.  Being monitored at the hospital until you pass the stone through urination.  Increasing your fluid intake and decreasing the amount of calcium and protein in your diet.  A procedure to break up kidney stones in the bladder using:  A focused beam of light (laser therapy).  Shock waves (extracorporeal shock wave lithotripsy).  Surgery to remove kidney stones. This may be  needed if you have severe pain or have stones that block your urinary tract. Follow these instructions at home: Eating and drinking  Drink enough fluid to keep your urine clear or pale yellow. This will help you to pass the kidney stone.  If directed, change your diet. This may include:  Limiting how much sodium you eat.  Eating more fruits and vegetables.  Limiting how much meat, poultry, fish, and  eggs you eat.  Follow instructions from your health care provider about eating or drinking restrictions. General instructions  Collect urine samples as told by your health care provider. You may need to collect a urine sample:  24 hours after you pass the stone.  8-12 weeks after passing the kidney stone, and every 6-12 months after that.  Strain your urine every time you urinate, for as long as directed. Use the strainer that your health care provider recommends.  Do not throw out the kidney stone after passing it. Keep the stone so it can be tested by your health care provider. Testing the makeup of your kidney stone may help prevent you from getting kidney stones in the future.  Take over-the-counter and prescription medicines only as told by your health care provider.  Keep all follow-up visits as told by your health care provider. This is important. You may need follow-up X-rays or ultrasounds to make sure that your stone has passed. How is this prevented? To prevent another kidney stone:  Drink enough fluid to keep your urine clear or pale yellow. This is the best way to prevent kidney stones.  Eat a healthy diet and follow recommendations from your health care provider about foods to avoid. You may be instructed to eat a low-protein diet. Recommendations vary depending on the type of kidney stone that you have.  Maintain a healthy weight. Contact a health care provider if:  You have pain that gets worse or does not get better with medicine. Get help right away if:  You have a fever or chills.  You develop severe pain.  You develop new abdominal pain.  You faint.  You are unable to urinate. This information is not intended to replace advice given to you by your health care provider. Make sure you discuss any questions you have with your health care provider. Document Released: 09/29/2005 Document Revised: 04/18/2016 Document Reviewed: 03/14/2016 Elsevier Interactive  Patient Education  2017 [employer]   Renal Colic Renal colic is pain that is caused by passing a kidney stone. The pain can be sharp and severe. It may be felt in the back, abdomen, side (flank), or groin. It can cause nausea. Renal colic can come and go. Follow these instructions at home: Watch your condition for any changes. The following actions may help to lessen any discomfort that you are feeling:  Take medicines only as directed by your health care provider.  Ask your health care provider if it is okay to take over-the-counter pain medicine.  Drink enough fluid to keep your urine clear or pale yellow. Drink 6-8 glasses of water each day.  Limit the amount of salt that you eat to less than 2 grams per day.  Reduce the amount of protein in your diet. Eat less meat, fish, nuts, and dairy.  Avoid foods such as spinach, rhubarb, nuts, or bran. These may make kidney stones more likely to form. Contact a health care provider if:  You have a fever or chills.  Your urine smells bad or looks cloudy.  You have pain or burning when you pass urine. Get help right away if:  Your flank pain or groin pain suddenly worsens.  You become confused or disoriented or you lose consciousness. This information is not intended to replace advice given to you by your health care provider. Make sure you discuss any questions you have with your health care provider. Document Released: 07/09/2005 Document Revised: 03/04/2016 Document Reviewed: 08/09/2014 Elsevier Interactive Patient Education  2017 [employer] Lithotripsy, Care After Refer to this sheet in the next few weeks. These instructions provide you with information on caring for yourself after your procedure. Your health care provider may also give you more specific instructions. Your treatment has been planned according to current medical practices, but problems sometimes occur. Call your health care provider if you have any problems or  questions after your procedure. WHAT TO EXPECT AFTER THE PROCEDURE   Your urine may have a red tinge for a few days after treatment. Blood loss is usually minimal.  You may have soreness in the back or flank area. This usually goes away after a few days. The procedure can cause blotches or bruises on the back where the pressure wave enters the skin. These marks usually cause only minimal discomfort and should disappear in a short time.  Stone fragments should begin to pass within 24 hours of treatment. However, a delayed passage is not unusual.  You may have pain, discomfort, and feel sick to your stomach (nauseated) when the crushed fragments of stone are passed down the tube from the kidney to the bladder. Stone fragments can pass soon after the procedure and may last for up to 4-8 weeks.  A small number of patients may have severe pain when stone fragments are not able to pass, which leads to an obstruction.  If your stone is greater than 1 inch (2.5 cm) in diameter or if you have multiple stones that have a combined diameter greater than 1 inch (2.5 cm), you may require more than one treatment.  If you had a stent placed prior to your procedure, you may experience some discomfort, especially during urination. You may experience the pain or discomfort in your flank or back, or you may experience a sharp pain or discomfort at the base of your penis or in your lower abdomen. The discomfort usually lasts only a few minutes after urinating. HOME CARE INSTRUCTIONS   Rest at home until you feel your energy improving.  Only take over-the-counter or prescription medicines for pain, discomfort, or fever as directed by your health care provider. Depending on the type of lithotripsy, you may need to take antibiotics and anti-inflammatory medicines for a few days.  Drink enough water and fluids to keep your urine clear or pale yellow. This helps "flush" your kidneys. It helps pass any remaining pieces  of stone and prevents stones from coming back.  Most people can resume daily activities within 1-2 days after standard lithotripsy. It can take longer to recover from laser and percutaneous lithotripsy.  Strain all urine through the provided strainer. Keep all particulate matter and stones for your health care provider to see. The stone may be as small as a grain of salt. It is very important to use the strainer each and every time you pass your urine. Any stones that are found can be sent to a medical lab for examination.  Visit your health care provider for a follow-up appointment in a few weeks. Your doctor may remove your stent if  you have one. Your health care provider will also check to see whether stone particles still remain. SEEK MEDICAL CARE IF:   Your pain is not relieved by medicine.  You have a lasting nauseous feeling.  You feel there is too much blood in the urine.  You develop persistent problems with frequent or painful urination that does not at least partially improve after 2 days following the procedure.  You have a congested cough.  You feel lightheaded.  You develop a rash or any other signs that might suggest an allergic problem.  You develop any reaction or side effects to your medicine(s). SEEK IMMEDIATE MEDICAL CARE IF:   You experience severe back or flank pain or both.  You see nothing but blood when you urinate.  You cannot pass any urine at all.  You have a fever or shaking chills.  You develop shortness of breath, difficulty breathing, or chest pain.  You develop vomiting that will not stop after 6-8 hours.  You have a fainting episode. This information is not intended to replace advice given to you by your health care provider. Make sure you discuss any questions you have with your health care provider. Document Released: 10/19/2007 Document Revised: 06/20/2015 Document Reviewed: 04/14/2013 Elsevier Interactive Patient Education  2017 [employer].

## 2016-10-03 ENCOUNTER — Encounter: Payer: Self-pay | Admitting: Urology

## 2016-10-10 ENCOUNTER — Encounter: Payer: Self-pay | Admitting: *Deleted

## 2016-10-10 ENCOUNTER — Telehealth: Payer: Self-pay | Admitting: *Deleted

## 2016-10-10 DIAGNOSIS — I5020 Unspecified systolic (congestive) heart failure: Secondary | ICD-10-CM

## 2016-10-10 DIAGNOSIS — J45909 Unspecified asthma, uncomplicated: Secondary | ICD-10-CM

## 2016-10-10 NOTE — Telephone Encounter (Signed)
Returned call.  Marshall is scheduled for a stent removal on Tuesday.  He is cleared to return from cardiac and kidney standpoint.  Spoke with Johnny Bridge about making sure that Dr. Evelene Croon provides clearance for Douglas Washington to start rehab after his stent removal.

## 2016-10-10 NOTE — Pre-Procedure Instructions (Deleted)
Spoke with Dr. Carroll regarding pt's CXR results from yesterday's PAT visit.  Dr. Carroll instructed this RN to fax this CXR results to Dr. Parachos, (already done) and to pt's PCP with info that pt is scheduled for a pacemaker insertion on 10/14/16 with Dr. Paraschos and what needs to be done for this to happen.  Does the surgery need to be postponed? 

## 2016-10-10 NOTE — H&P (Signed)
NAMEMOHD, BOLSINGER NO.:  [id number]  MEDICAL RECORD NO.:  [id number]  LOCATION:  [facility]                        FACILITY:  [facility]  PHYSICIAN:  Anola Gurney          DATE OF BIRTH:  02/09/1929  DATE OF ADMISSION:  09/23/2016 DATE OF DISCHARGE:                            HISTORY AND PHYSICAL   Same-day surgery, October 14, 2016.  CHIEF COMPLAINT:  Kidney stones.  HISTORY OF PRESENT ILLNESS:  Mr. Ahmad is an 82 year old white male with obstructive left ureteral stone and renal stones who underwent ureteroscopy with stent placement followed by lithotripsy.  He is now stone free on the left side and comes in for stent retrieval.  PAST MEDICAL HISTORY:  The patient is allergic to penicillin, Cipro, and sulfa.  CURRENT MEDICATIONS:  Eliquis, Coreg, amiodarone, ProAir inhaler, lisinopril, Zyrtec, timolol, latanoprost, brimonidine, dorzolamide, prednisolone, montelukast, tamsulosin, and valacyclovir.  PAST SURGICAL HISTORY: 1. 1996 ureteroscopic ureterolithotomy. 2. 2001 lithotripsy. 3. 2005 left hip replacement. 4. 2006 craniotomy due to an injury. 5. 2009 right shoulder replacement. 6. 2009 TUMT. 7. 2010 left shoulder replacement. 8. 2011 pacemaker placement.  SOCIAL HISTORY:  The patient denied tobacco or alcohol use.  FAMILY HISTORY:  Positive for diabetes and heart disease.  PAST AND CURRENT MEDICAL CONDITIONS: 1. Cardiac arrhythmia that requires pacer support. 2. Hypertension. 3. COPD. 4. Sleep apnea. 5. Glaucoma. 6. Partial hearing loss.  REVIEW OF SYSTEMS:  The patient denied chest pain, shortness of breath, diabetes, or stroke.  PHYSICAL EXAMINATION:  GENERAL:  Well-nourished white male, in no acute distress. HEENT:  Sclerae were clear. NECK:  No palpable cervical adenopathy. LUNGS:  Clear to auscultation. CARDIOVASCULAR:  Regular rhythm and rate without audible murmurs. ABDOMEN:  Soft, nontender abdomen. GU:  Deferred. NEUROMUSCULAR:   Alert and oriented x3.  IMPRESSION:  Left nephrolithiasis, status post stent placement.  PLAN:  Cystoscopy with stent retrieval.          ______________________________ Anola Gurney     MW/MEDQ  D:  10/09/2016  T:  10/09/2016  Job:  [id number]

## 2016-10-14 ENCOUNTER — Encounter
Admission: RE | Disposition: A | Discharge status: Home / Self Care | Payer: Self-pay | Source: Ambulatory Visit | Attending: Urology

## 2016-10-14 ENCOUNTER — Ambulatory Visit
Admission: RE | Admit: 2016-10-14 | Discharge: 2016-10-14 | Disposition: A | Discharge status: Home / Self Care | Payer: Medicare Other | Source: Ambulatory Visit | Attending: Urology | Admitting: Urology

## 2016-10-14 ENCOUNTER — Ambulatory Visit: Payer: Medicare Other | Admitting: Anesthesiology

## 2016-10-14 ENCOUNTER — Emergency Department: Payer: Medicare Other

## 2016-10-14 ENCOUNTER — Encounter: Payer: Self-pay | Admitting: Emergency Medicine

## 2016-10-14 ENCOUNTER — Encounter: Payer: Self-pay | Admitting: *Deleted

## 2016-10-14 ENCOUNTER — Inpatient Hospital Stay
Admission: EM | Admit: 2016-10-14 | Discharge: 2016-10-19 | DRG: 871 | Disposition: A | Discharge status: Home Health | Payer: Medicare Other | Attending: Internal Medicine | Admitting: Internal Medicine

## 2016-10-14 ENCOUNTER — Other Ambulatory Visit: Payer: Self-pay

## 2016-10-14 DIAGNOSIS — E669 Obesity, unspecified: Secondary | ICD-10-CM

## 2016-10-14 DIAGNOSIS — Z888 Allergy status to other drugs, medicaments and biological substances status: Secondary | ICD-10-CM

## 2016-10-14 DIAGNOSIS — Z87820 Personal history of traumatic brain injury: Secondary | ICD-10-CM | POA: Insufficient documentation

## 2016-10-14 DIAGNOSIS — Z950 Presence of cardiac pacemaker: Secondary | ICD-10-CM | POA: Insufficient documentation

## 2016-10-14 DIAGNOSIS — Z882 Allergy status to sulfonamides status: Secondary | ICD-10-CM

## 2016-10-14 DIAGNOSIS — Z6829 Body mass index (BMI) 29.0-29.9, adult: Secondary | ICD-10-CM | POA: Insufficient documentation

## 2016-10-14 DIAGNOSIS — Z79899 Other long term (current) drug therapy: Secondary | ICD-10-CM

## 2016-10-14 DIAGNOSIS — Z881 Allergy status to other antibiotic agents status: Secondary | ICD-10-CM

## 2016-10-14 DIAGNOSIS — Z466 Encounter for fitting and adjustment of urinary device: Secondary | ICD-10-CM | POA: Insufficient documentation

## 2016-10-14 DIAGNOSIS — I2510 Atherosclerotic heart disease of native coronary artery without angina pectoris: Secondary | ICD-10-CM | POA: Insufficient documentation

## 2016-10-14 DIAGNOSIS — Z87442 Personal history of urinary calculi: Secondary | ICD-10-CM

## 2016-10-14 DIAGNOSIS — I509 Heart failure, unspecified: Secondary | ICD-10-CM | POA: Insufficient documentation

## 2016-10-14 DIAGNOSIS — I252 Old myocardial infarction: Secondary | ICD-10-CM

## 2016-10-14 DIAGNOSIS — Z8249 Family history of ischemic heart disease and other diseases of the circulatory system: Secondary | ICD-10-CM

## 2016-10-14 DIAGNOSIS — J449 Chronic obstructive pulmonary disease, unspecified: Secondary | ICD-10-CM | POA: Diagnosis present

## 2016-10-14 DIAGNOSIS — H9190 Unspecified hearing loss, unspecified ear: Secondary | ICD-10-CM | POA: Diagnosis present

## 2016-10-14 DIAGNOSIS — Z87891 Personal history of nicotine dependence: Secondary | ICD-10-CM

## 2016-10-14 DIAGNOSIS — Z96611 Presence of right artificial shoulder joint: Secondary | ICD-10-CM | POA: Diagnosis present

## 2016-10-14 DIAGNOSIS — Z96612 Presence of left artificial shoulder joint: Secondary | ICD-10-CM

## 2016-10-14 DIAGNOSIS — I2699 Other pulmonary embolism without acute cor pulmonale: Secondary | ICD-10-CM | POA: Diagnosis present

## 2016-10-14 DIAGNOSIS — I110 Hypertensive heart disease with heart failure: Secondary | ICD-10-CM | POA: Insufficient documentation

## 2016-10-14 DIAGNOSIS — Z96642 Presence of left artificial hip joint: Secondary | ICD-10-CM | POA: Diagnosis present

## 2016-10-14 DIAGNOSIS — Z880 Allergy status to penicillin: Secondary | ICD-10-CM

## 2016-10-14 DIAGNOSIS — Z7901 Long term (current) use of anticoagulants: Secondary | ICD-10-CM

## 2016-10-14 DIAGNOSIS — N202 Calculus of kidney with calculus of ureter: Secondary | ICD-10-CM | POA: Diagnosis present

## 2016-10-14 DIAGNOSIS — G4733 Obstructive sleep apnea (adult) (pediatric): Secondary | ICD-10-CM

## 2016-10-14 DIAGNOSIS — I130 Hypertensive heart and chronic kidney disease with heart failure and stage 1 through stage 4 chronic kidney disease, or unspecified chronic kidney disease: Secondary | ICD-10-CM | POA: Diagnosis present

## 2016-10-14 DIAGNOSIS — R011 Cardiac murmur, unspecified: Secondary | ICD-10-CM

## 2016-10-14 DIAGNOSIS — I081 Rheumatic disorders of both mitral and tricuspid valves: Secondary | ICD-10-CM | POA: Diagnosis present

## 2016-10-14 DIAGNOSIS — Z452 Encounter for adjustment and management of vascular access device: Secondary | ICD-10-CM

## 2016-10-14 DIAGNOSIS — I083 Combined rheumatic disorders of mitral, aortic and tricuspid valves: Secondary | ICD-10-CM

## 2016-10-14 DIAGNOSIS — A419 Sepsis, unspecified organism: Secondary | ICD-10-CM | POA: Diagnosis not present

## 2016-10-14 DIAGNOSIS — I9589 Other hypotension: Secondary | ICD-10-CM | POA: Diagnosis present

## 2016-10-14 DIAGNOSIS — Z7902 Long term (current) use of antithrombotics/antiplatelets: Secondary | ICD-10-CM

## 2016-10-14 DIAGNOSIS — T814XXA Infection following a procedure, initial encounter: Secondary | ICD-10-CM | POA: Diagnosis not present

## 2016-10-14 DIAGNOSIS — I824Z1 Acute embolism and thrombosis of unspecified deep veins of right distal lower extremity: Secondary | ICD-10-CM | POA: Diagnosis present

## 2016-10-14 DIAGNOSIS — E8809 Other disorders of plasma-protein metabolism, not elsewhere classified: Secondary | ICD-10-CM | POA: Diagnosis present

## 2016-10-14 DIAGNOSIS — R6521 Severe sepsis with septic shock: Secondary | ICD-10-CM | POA: Diagnosis present

## 2016-10-14 DIAGNOSIS — N189 Chronic kidney disease, unspecified: Secondary | ICD-10-CM | POA: Diagnosis present

## 2016-10-14 DIAGNOSIS — Z833 Family history of diabetes mellitus: Secondary | ICD-10-CM

## 2016-10-14 DIAGNOSIS — R2681 Unsteadiness on feet: Secondary | ICD-10-CM

## 2016-10-14 DIAGNOSIS — Z91048 Other nonmedicinal substance allergy status: Secondary | ICD-10-CM

## 2016-10-14 DIAGNOSIS — I5033 Acute on chronic diastolic (congestive) heart failure: Secondary | ICD-10-CM | POA: Diagnosis present

## 2016-10-14 DIAGNOSIS — Y92019 Unspecified place in single-family (private) house as the place of occurrence of the external cause: Secondary | ICD-10-CM

## 2016-10-14 DIAGNOSIS — A4181 Sepsis due to Enterococcus: Principal | ICD-10-CM | POA: Diagnosis present

## 2016-10-14 DIAGNOSIS — I481 Persistent atrial fibrillation: Secondary | ICD-10-CM | POA: Diagnosis present

## 2016-10-14 DIAGNOSIS — Z885 Allergy status to narcotic agent status: Secondary | ICD-10-CM

## 2016-10-14 DIAGNOSIS — N179 Acute kidney failure, unspecified: Secondary | ICD-10-CM | POA: Diagnosis present

## 2016-10-14 DIAGNOSIS — H409 Unspecified glaucoma: Secondary | ICD-10-CM | POA: Diagnosis present

## 2016-10-14 DIAGNOSIS — N182 Chronic kidney disease, stage 2 (mild): Secondary | ICD-10-CM | POA: Diagnosis present

## 2016-10-14 DIAGNOSIS — Z95818 Presence of other cardiac implants and grafts: Secondary | ICD-10-CM

## 2016-10-14 DIAGNOSIS — Z8619 Personal history of other infectious and parasitic diseases: Secondary | ICD-10-CM

## 2016-10-14 HISTORY — DX: Unspecified asthma, uncomplicated: J45.909

## 2016-10-14 HISTORY — PX: CYSTOSCOPY W/ URETERAL STENT REMOVAL: SHX1430

## 2016-10-14 HISTORY — DX: Disorder of kidney and ureter, unspecified: N28.9

## 2016-10-14 LAB — URINALYSIS, COMPLETE (UACMP) WITH MICROSCOPIC
BILIRUBIN URINE: NEGATIVE
Glucose, UA: NEGATIVE mg/dL
Ketones, ur: NEGATIVE mg/dL
NITRITE: NEGATIVE
PH: 6 (ref 5.0–8.0)
Protein, ur: 30 mg/dL — AB
SPECIFIC GRAVITY, URINE: 1.012 (ref 1.005–1.030)
Squamous Epithelial / LPF: NONE SEEN

## 2016-10-14 LAB — BASIC METABOLIC PANEL
Anion gap: 5 (ref 5–15)
BUN: 21 mg/dL — AB (ref 6–20)
CALCIUM: 9 mg/dL (ref 8.9–10.3)
CO2: 24 mmol/L (ref 22–32)
CREATININE: 1.05 mg/dL (ref 0.61–1.24)
Chloride: 107 mmol/L (ref 101–111)
Glucose, Bld: 110 mg/dL — ABNORMAL HIGH (ref 65–99)
Potassium: 4.2 mmol/L (ref 3.5–5.1)
SODIUM: 136 mmol/L (ref 135–145)

## 2016-10-14 LAB — CBC WITH DIFFERENTIAL/PLATELET
BASOS PCT: 2 %
Basophils Absolute: 0.1 10*3/uL (ref 0–0.1)
Eosinophils Absolute: 0.1 10*3/uL (ref 0–0.7)
Eosinophils Relative: 1 %
HEMATOCRIT: 34.9 % — AB (ref 40.0–52.0)
HEMOGLOBIN: 11.5 g/dL — AB (ref 13.0–18.0)
LYMPHS PCT: 4 %
Lymphs Abs: 0.2 10*3/uL — ABNORMAL LOW (ref 1.0–3.6)
MCH: 28.6 pg (ref 26.0–34.0)
MCHC: 32.9 g/dL (ref 32.0–36.0)
MCV: 86.8 fL (ref 80.0–100.0)
MONOS PCT: 1 %
Monocytes Absolute: 0 10*3/uL — ABNORMAL LOW (ref 0.2–1.0)
NEUTROS ABS: 4.4 10*3/uL (ref 1.4–6.5)
NEUTROS PCT: 92 %
Platelets: 266 10*3/uL (ref 150–440)
RBC: 4.02 MIL/uL — ABNORMAL LOW (ref 4.40–5.90)
RDW: 18.3 % — ABNORMAL HIGH (ref 11.5–14.5)
WBC: 4.8 10*3/uL (ref 3.8–10.6)

## 2016-10-14 LAB — CBC
HCT: 35 % — ABNORMAL LOW (ref 40.0–52.0)
Hemoglobin: 11.4 g/dL — ABNORMAL LOW (ref 13.0–18.0)
MCH: 28.1 pg (ref 26.0–34.0)
MCHC: 32.6 g/dL (ref 32.0–36.0)
MCV: 86.4 fL (ref 80.0–100.0)
Platelets: 252 10*3/uL (ref 150–440)
RBC: 4.05 MIL/uL — ABNORMAL LOW (ref 4.40–5.90)
RDW: 18.4 % — AB (ref 11.5–14.5)
WBC: 4.7 10*3/uL (ref 3.8–10.6)

## 2016-10-14 LAB — RAPID INFLUENZA A&B ANTIGENS (ARMC ONLY): INFLUENZA A (ARMC): NEGATIVE

## 2016-10-14 LAB — TROPONIN I

## 2016-10-14 LAB — RAPID INFLUENZA A&B ANTIGENS: Influenza B (ARMC): NEGATIVE

## 2016-10-14 SURGERY — REMOVAL, STENT, URETER, CYSTOSCOPIC
Anesthesia: General | Site: Ureter | Laterality: Left | Wound class: Clean Contaminated

## 2016-10-14 MED ORDER — PHENYLEPHRINE 40 MCG/ML (10ML) SYRINGE FOR IV PUSH (FOR BLOOD PRESSURE SUPPORT)
PREFILLED_SYRINGE | INTRAVENOUS | Status: AC
  Filled 2016-10-14: qty 10

## 2016-10-14 MED ORDER — LIDOCAINE HCL (CARDIAC) 20 MG/ML IV SOLN
INTRAVENOUS | Status: DC | PRN
  Administered 2016-10-14: 60 mg via INTRAVENOUS

## 2016-10-14 MED ORDER — BELLADONNA ALKALOIDS-OPIUM 16.2-60 MG RE SUPP
RECTAL | Status: AC
  Filled 2016-10-14: qty 1

## 2016-10-14 MED ORDER — LIDOCAINE HCL 2 % EX GEL
CUTANEOUS | Status: DC | PRN
  Administered 2016-10-14: 1

## 2016-10-14 MED ORDER — ONDANSETRON HCL 4 MG/2ML IJ SOLN
INTRAMUSCULAR | Status: DC | PRN
  Administered 2016-10-14: 4 mg via INTRAVENOUS

## 2016-10-14 MED ORDER — FENTANYL CITRATE (PF) 100 MCG/2ML IJ SOLN: 25.0000 ug | INTRAMUSCULAR | Status: DC | PRN

## 2016-10-14 MED ORDER — LIDOCAINE HCL 2 % EX GEL
CUTANEOUS | Status: AC
  Filled 2016-10-14: qty 10

## 2016-10-14 MED ORDER — GENTAMICIN SULFATE 40 MG/ML IJ SOLN
80.0000 mg | Freq: Once | INTRAVENOUS | Status: DC
  Filled 2016-10-14: qty 2

## 2016-10-14 MED ORDER — PROMETHAZINE HCL 25 MG/ML IJ SOLN: 6.2500 mg | INTRAMUSCULAR | Status: DC | PRN

## 2016-10-14 MED ORDER — LIDOCAINE 2% (20 MG/ML) 5 ML SYRINGE
INTRAMUSCULAR | Status: AC
  Filled 2016-10-14: qty 5

## 2016-10-14 MED ORDER — PROPOFOL 10 MG/ML IV BOLUS
INTRAVENOUS | Status: DC | PRN
  Administered 2016-10-14: 100 mg via INTRAVENOUS
  Administered 2016-10-14: 30 mg via INTRAVENOUS

## 2016-10-14 MED ORDER — PHENYLEPHRINE HCL 10 MG/ML IJ SOLN
INTRAMUSCULAR | Status: DC | PRN
  Administered 2016-10-14 (×2): 80 ug via INTRAVENOUS

## 2016-10-14 MED ORDER — FENTANYL CITRATE (PF) 100 MCG/2ML IJ SOLN
INTRAMUSCULAR | Status: DC | PRN
  Administered 2016-10-14: 25 ug via INTRAVENOUS

## 2016-10-14 MED ORDER — GENTAMICIN IN SALINE 1.6-0.9 MG/ML-% IV SOLN
80.0000 mg | INTRAVENOUS | Status: AC
  Administered 2016-10-14: 80 mg via INTRAVENOUS
  Filled 2016-10-14: qty 50

## 2016-10-14 MED ORDER — MIDAZOLAM HCL 2 MG/2ML IJ SOLN
INTRAMUSCULAR | Status: AC
  Filled 2016-10-14: qty 2

## 2016-10-14 MED ORDER — LACTATED RINGERS IV SOLN
INTRAVENOUS | Status: DC
  Administered 2016-10-14: 13:00:00 via INTRAVENOUS

## 2016-10-14 MED ORDER — FENTANYL CITRATE (PF) 100 MCG/2ML IJ SOLN
INTRAMUSCULAR | Status: AC
  Filled 2016-10-14: qty 2

## 2016-10-14 MED ORDER — ACETAMINOPHEN 325 MG PO TABS
650.0000 mg | ORAL_TABLET | Freq: Once | ORAL | Status: AC | PRN
  Administered 2016-10-14: 650 mg via ORAL
  Filled 2016-10-14: qty 2

## 2016-10-14 MED ORDER — MEPERIDINE HCL 25 MG/ML IJ SOLN: 6.2500 mg | INTRAMUSCULAR | Status: DC | PRN

## 2016-10-14 MED ORDER — PROPOFOL 10 MG/ML IV BOLUS
INTRAVENOUS | Status: AC
  Filled 2016-10-14: qty 20

## 2016-10-14 MED ORDER — BELLADONNA ALKALOIDS-OPIUM 16.2-60 MG RE SUPP
RECTAL | Status: DC | PRN
  Administered 2016-10-14: 1 via RECTAL

## 2016-10-14 MED ORDER — ONDANSETRON HCL 4 MG/2ML IJ SOLN
INTRAMUSCULAR | Status: AC
  Filled 2016-10-14: qty 2

## 2016-10-14 SURGICAL SUPPLY — 17 items
BAG DRAIN CYSTO-URO LG1000N (MISCELLANEOUS) ×3 IMPLANT
GLOVE BIO SURGEON STRL SZ7 (GLOVE) ×6 IMPLANT
GLOVE BIO SURGEON STRL SZ7.5 (GLOVE) ×3 IMPLANT
GOWN STRL REUS W/ TWL LRG LVL3 (GOWN DISPOSABLE) ×1 IMPLANT
GOWN STRL REUS W/ TWL XL LVL3 (GOWN DISPOSABLE) ×1 IMPLANT
GOWN STRL REUS W/TWL LRG LVL3 (GOWN DISPOSABLE) ×2
GOWN STRL REUS W/TWL XL LVL3 (GOWN DISPOSABLE) ×2
GUIDEWIRE STR ZIPWIRE 035X150 (MISCELLANEOUS) ×3 IMPLANT
PACK CYSTO AR (MISCELLANEOUS) ×3 IMPLANT
PREP PVP WINGED SPONGE (MISCELLANEOUS) ×3 IMPLANT
SET CYSTO W/LG BORE CLAMP LF (SET/KITS/TRAYS/PACK) ×3 IMPLANT
SOL .9 NS 3000ML IRR  AL (IV SOLUTION) ×2
SOL .9 NS 3000ML IRR UROMATIC (IV SOLUTION) ×1 IMPLANT
SOL PREP PVP 2OZ (MISCELLANEOUS) ×3
SOLUTION PREP PVP 2OZ (MISCELLANEOUS) ×1 IMPLANT
SURGILUBE 2OZ TUBE FLIPTOP (MISCELLANEOUS) ×3 IMPLANT
WATER STERILE IRR 1000ML POUR (IV SOLUTION) ×3 IMPLANT

## 2016-10-14 NOTE — ED Triage Notes (Addendum)
Pt to ED via EMS from home for body aches tremors, per EMS pt had diminised lung sounds , 86% on RA, 10 L with 100%. Denies CP. Per EMS pt has been off Plavix x7days for urinary stent removal, that was today in same day surgery. Pt A&Ox4

## 2016-10-14 NOTE — OR Nursing (Signed)
Bilateral hearing aids in ears when transported to pacu

## 2016-10-14 NOTE — ED Provider Notes (Addendum)
[facility] Emergency Department Provider Note   ____________________________________________   First MD Initiated Contact with Patient 10/14/16 2117     (approximate)  I have reviewed the triage vital signs and the nursing notes.   HISTORY  Chief Complaint Shortness of Breath   HPI Douglas Washington is a 83 y.o. male who reports Dr. both pulled the stent out of it earlier today. That was about 4:00. Patient went home and about 7:00 he began feeling chilled and shaking and cold. He put on a coat and blankets. Now he complains of achiness in his shoulders his hips and his back. He still feels cold. He was short of breath and EMS reports the fire department said his sats were in the 80s. At the present time there 98 on room air.   Past Medical History:  Diagnosis Date  . Allergy   . Asthma   . CHF (congestive heart failure) (HCC)    10/17 WATCHMAN FILTER IMPLANTED  . Chronic kidney disease    Kidney stones  . COPD (chronic obstructive pulmonary disease) (HCC)   . Dysrhythmia    WATCHMAN FILTER 10/17  . Glaucoma (increased eye pressure)   . Hearing loss   . History of shingles 4 plus years ago   set in left eye  . Hypertension   . OSA on CPAP   . Persistent atrial fibrillation (HCC) 06/05/2016  . Presence of permanent cardiac pacemaker    2010  . Renal insufficiency   . Sebaceous cyst    Upper back  . Shortness of breath dyspnea    with exertion  . Sinus bradycardia    s/p MDT PPM   . Traumatic brain injury (HCC) 01/2005  . Ventricular tachycardia (HCC)    05/2016 requiring DCCV - started on amiodarone     Patient Active Problem List   Diagnosis Date Noted  . VT (ventricular tachycardia) (HCC) 06/05/2016  . Sebaceous cyst 05/16/2015  . Benign essential HTN 01/31/2015  . Chronic systolic heart failure (HCC) 08/28/2014  . MI (mitral incompetence) 08/28/2014  . Obstructive apnea 08/28/2014  . Chest pain 04/28/2014  . Persistent atrial  fibrillation (HCC) 03/14/2014  . Bradycardia 03/14/2014  . Combined fat and carbohydrate induced hyperlipemia 03/14/2014    Past Surgical History:  Procedure Laterality Date  . APPENDECTOMY    . CARDIAC CATHETERIZATION N/A 06/06/2016   Procedure: Left Heart Cath and Coronary Angiography;  Surgeon: Kathleene Hazel, MD;  Location: [facility] INVASIVE CV LAB;  Service: Cardiovascular;  Laterality: N/A;  . CRANIOTOMY  03/2005  . CYSTOSCOPY WITH STENT PLACEMENT Left 07/15/2016   Procedure: CYSTOSCOPY WITH STENT PLACEMENT;  Surgeon: Orson Ape, MD;  Location: [facility] ORS;  Service: Urology;  Laterality: Left;  . EXTRACORPOREAL SHOCK WAVE LITHOTRIPSY Left 10/02/2016   Procedure: EXTRACORPOREAL SHOCK WAVE LITHOTRIPSY (ESWL);  Surgeon: Orson Ape, MD;  Location: [facility] ORS;  Service: Urology;  Laterality: Left;  . INSERT / REPLACE / REMOVE PACEMAKER    . JOINT REPLACEMENT    . LEFT ATRIAL APPENDAGE OCCLUSION  07/31/2016  . LEFT ATRIAL APPENDAGE OCCLUSION N/A 07/31/2016   Procedure: LEFT ATRIAL APPENDAGE OCCLUSION;  Surgeon: [name], MD;  Location: [facility] INVASIVE CV LAB;  Service: Cardiovascular;  Laterality: N/A;  . PACEMAKER INSERTION  2011   MDT dual chamber PPM followed by [facility] implanted for sinus bradycardia  . SHOULDER ACROMIOPLASTY Bilateral   . TEE WITHOUT CARDIOVERSION N/A 07/21/2016   Procedure: TRANSESOPHAGEAL ECHOCARDIOGRAM (TEE);  Surgeon: Elmarie Shiley  Duke Salvia, MD;  Location: [facility] ENDOSCOPY;  Service: Cardiovascular;  Laterality: N/A;  . TEE WITHOUT CARDIOVERSION N/A 09/12/2016   Procedure: TRANSESOPHAGEAL ECHOCARDIOGRAM (TEE);  Surgeon: Wendall Stade, MD;  Location: [facility] ENDOSCOPY;  Service: Cardiovascular;  Laterality: N/A;  . TOTAL HIP ARTHROPLASTY Left 2005  . URETEROSCOPY WITH HOLMIUM LASER LITHOTRIPSY Left 07/15/2016   Procedure: URETEROSCOPY WITH HOLMIUM LASER LITHOTRIPSY;  Surgeon: Orson Ape, MD;  Location: [facility] ORS;  Service: Urology;  Laterality: Left;    Prior to  Admission medications   Medication Sig Start Date End Date Taking? Authorizing Provider  albuterol (PROVENTIL HFA;VENTOLIN HFA) 108 (90 BASE) MCG/ACT inhaler Inhale 2 puffs into the lungs every 6 (six) hours as needed for wheezing or shortness of breath.   Yes Historical Provider, MD  amiodarone (PACERONE) 100 MG tablet Take 1 tablet (100 mg total) by mouth daily. 09/19/16  Yes Amber Caryl Bis, NP  brimonidine (ALPHAGAN) 0.2 % ophthalmic solution Place 1 drop into the left eye 2 (two) times daily. 04/27/15  Yes Historical Provider, MD  carvedilol (COREG) 3.125 MG tablet Take 1 tablet (3.125 mg total) by mouth 2 (two) times daily with a meal. 07/10/16  Yes Amber Caryl Bis, NP  cetirizine (ZYRTEC) 10 MG tablet Take 10 mg by mouth daily as needed for allergies.    Yes Historical Provider, MD  docusate sodium (COLACE) 100 MG capsule Take 2 capsules (200 mg total) by mouth 2 (two) times daily. 07/15/16  Yes Orson Ape, MD  dorzolamide (TRUSOPT) 2 % ophthalmic solution Place 1 drop into both eyes 2 (two) times daily. 04/30/15  Yes Historical Provider, MD  Glucosamine-Chondroit-Vit C-Mn (GLUCOSAMINE 1500 COMPLEX) CAPS Take 1 capsule by mouth daily.   Yes Historical Provider, MD  latanoprost (XALATAN) 0.005 % ophthalmic solution Place 1 drop into both eyes at bedtime. 02/24/15  Yes Historical Provider, MD  MEGARED OMEGA-3 KRILL OIL 500 MG CAPS Take 500 mg by mouth daily.    Yes Historical Provider, MD  montelukast (SINGULAIR) 10 MG tablet Take 10 mg by mouth at bedtime.   Yes Historical Provider, MD  Multiple Vitamins-Minerals (MULTIVITAMIN WITH MINERALS) tablet Take 1 tablet by mouth daily.   Yes Historical Provider, MD  timolol (TIMOPTIC) 0.5 % ophthalmic solution Place 1 drop into the left eye 2 (two) times daily. 04/03/15  Yes Historical Provider, MD  valACYclovir (VALTREX) 500 MG tablet Take 500 mg by mouth daily. 05/08/16  Yes Historical Provider, MD  clopidogrel (PLAVIX) 75 MG tablet Take 75 mg by mouth  daily.    Historical Provider, MD  erythromycin ophthalmic ointment Place 1 application into both eyes at bedtime.  04/03/15   Historical Provider, MD  ondansetron (ZOFRAN ODT) 8 MG disintegrating tablet Take 1 tablet (8 mg total) by mouth every 6 (six) hours as needed for nausea or vomiting. Patient not taking: Reported on 10/09/2016 07/15/16   Orson Ape, MD    Allergies Penicillins; Adhesive [tape]; Ciprofloxacin; Oxycodone; Simvastatin; Statins; and Sulfa antibiotics  Family History  Problem Relation Age of Onset  . Diabetes Mother   . Heart disease Father   . Heart disease Brother     Social History Social History  Substance Use Topics  . Smoking status: Former Smoker    Quit date: 04/14/1974  . Smokeless tobacco: Former Neurosurgeon    Quit date: 04/14/1974  . Alcohol use No    Review of Systems Constitutional:  fever/chills Eyes: No visual changes. ENT: No sore throat. Cardiovascular: Denies chest pain. Respiratory:  Denies shortness of breath. Gastrointestinal: No abdominal pain.  No nausea, no vomiting.  No diarrhea.  No constipation. Genitourinary: Negative for dysuria. Musculoskeletal: See history of present illness Skin: Negative for rash. Neurological: Negative for headaches, focal weakness or numbness.  10-point ROS otherwise negative.  ____________________________________________   PHYSICAL EXAM:  VITAL SIGNS: ED Triage Vitals [10/14/16 2052]  Enc Vitals Group     BP 112/60     Pulse Rate 65     Resp 16     Temp 100.3 F (37.9 C)     Temp Source Oral     SpO2 98 %     Weight      Height      Head Circumference      Peak Flow      Pain Score      Pain Loc      Pain Edu?      Excl. in GC?     Constitutional: Alert and oriented. Well appearing and in no acute distress. Eyes: Conjunctivae are normal. PERRL. EOMI. Head: Atraumatic. Nose: No congestion/rhinnorhea. Mouth/Throat: Mucous membranes are moist.  Oropharynx non-erythematous. Neck: No  stridor.  Cardiovascular: Normal rate, regular rhythm. Grossly normal heart sounds.  Good peripheral circulation. Respiratory: Normal respiratory effort.  No retractions. Lungs CTAB. Gastrointestinal: Soft and nontender. No distention. No abdominal bruits. No CVA tenderness. Musculoskeletal: No lower extremity tenderness nor edema.  No joint effusions. Neurologic:  Normal speech and language. No gross focal neurologic deficits are appreciated. No gait instability. Skin:  Skin is warm, dry and intact. No rash noted.   ____________________________________________   LABS (all labs ordered are listed, but only abnormal results are displayed)  Labs Reviewed  BASIC METABOLIC PANEL - Abnormal; Notable for the following:       Result Value   Glucose, Bld 110 (*)    BUN 21 (*)    All other components within normal limits  CBC - Abnormal; Notable for the following:    RBC 4.05 (*)    Hemoglobin 11.4 (*)    HCT 35.0 (*)    RDW 18.4 (*)    All other components within normal limits  URINALYSIS, COMPLETE (UACMP) WITH MICROSCOPIC - Abnormal; Notable for the following:    Color, Urine YELLOW (*)    APPearance HAZY (*)    Hgb urine dipstick LARGE (*)    Protein, ur 30 (*)    Leukocytes, UA LARGE (*)    Bacteria, UA RARE (*)    All other components within normal limits  CBC WITH DIFFERENTIAL/PLATELET - Abnormal; Notable for the following:    RBC 4.02 (*)    Hemoglobin 11.5 (*)    HCT 34.9 (*)    RDW 18.3 (*)    Lymphs Abs 0.2 (*)    Monocytes Absolute 0.0 (*)    All other components within normal limits  RAPID INFLUENZA A&B ANTIGENS ([facility] ONLY)  URINE CULTURE  CULTURE, BLOOD (ROUTINE X 2)  CULTURE, BLOOD (ROUTINE X 2)  TROPONIN I  COMPREHENSIVE METABOLIC PANEL  URINALYSIS, ROUTINE W REFLEX MICROSCOPIC  I-STAT CG4 LACTIC ACID, ED  I-STAT CG4 LACTIC ACID, ED   ____________________________________________  EKG  EKG read and interpreted by me shows atrial paced rhythm rate of 68  left axis no acute ST-T wave changes seen baseline is somewhat irregular ____________________________________________  RADIOLOGY  Study Result   CLINICAL DATA:  Body aches and tremors  EXAM: CHEST  2 VIEW  COMPARISON:  06/05/2016  FINDINGS: Bilateral shoulder replacements.  Left-sided dual lead pacing device, similar in appearance. No acute infiltrate or effusion. Stable mild cardiomegaly without overt failure. Atherosclerosis. No pneumothorax. Multiple old right-sided rib fractures  IMPRESSION: 1. Cardiomegaly without overt failure   Electronically Signed   By: Jasmine Pang M.D.   On: 10/14/2016 22:01    ____________________________________________   PROCEDURES  Procedure(s) performed:  Procedures  Critical Care performed:  Critical care time 45 minutes including discussing the patient with the hospitalist internist and the pharmacist and discussing the patient with his family. ____________________________________________   INITIAL IMPRESSION / ASSESSMENT AND PLAN / ED COURSE  Pertinent labs & imaging results that were available during my care of the patient were reviewed by me and considered in my medical decision making (see chart for details).    Clinical Course    Patient drops his blood pressure. His temperature is gone up a little bit is 100.5. Patient is not hypoxic. Looks like he is septic. The only obvious source at present time is his urine. He is allergic to penicillin and has swelling. I discussed the patient with the other ER doc the hospitalist and intensivist as well as the pharmacist they'll recommend meropenem there is a less than 1% chance of cross reactivity with penicillin. I will also give him some gentamicin as a second dose since he had 1 dose earlier when he had the stent removed pharmacy agrees with all this. I have notified the intensivist they will follow him for admission.  ____________________________________________   FINAL  CLINICAL IMPRESSION(S) / ED DIAGNOSES  Final diagnoses:  None      NEW MEDICATIONS STARTED DURING THIS VISIT:  New Prescriptions   No medications on file     Note:  This document was prepared using Dragon voice recognition software and may include unintentional dictation errors.    Arnaldo Natal, MD 10/15/16 0022    Arnaldo Natal, MD 10/15/16 [phone]

## 2016-10-14 NOTE — Anesthesia Preprocedure Evaluation (Signed)
Anesthesia Evaluation  Patient identified by MRN, date of birth, ID band Patient awake    Reviewed: Allergy & Precautions, NPO status , Patient's Chart, lab work & pertinent test results  History of Anesthesia Complications Negative for: history of anesthetic complications  Airway Mallampati: II  TM Distance: >3 FB Neck ROM: Full    Dental no notable dental hx.    Pulmonary sleep apnea and Continuous Positive Airway Pressure Ventilation , COPD,  COPD inhaler, former smoker,    breath sounds clear to auscultation- rhonchi (-) wheezing      Cardiovascular hypertension, Pt. on medications +CHF  (-) CAD and (-) Past MI + dysrhythmias + pacemaker  Rhythm:Regular Rate:Normal - Systolic murmurs and - Diastolic murmurs Echo 09/12/16: - Left ventricle: Systolic function was normal. The estimated   ejection fraction was in the range of 55% to 60%. - Aortic valve: There was mild regurgitation. - Mitral valve: There was mild regurgitation. - Left atrium: The atrium was dilated. No evidence of thrombus in   the atrial cavity or appendage. - Right atrium: The atrium was dilated. - Atrial septum: There was a patent foramen ovale. - Tricuspid valve: There was moderate regurgitation. - Impressions: 33 mm Watchman device in good position with no leak   Compressed shoulder diameter as follows  Cardiac Cath 06/06/16:  Mid RCA lesion, 50 %stenosed.  Mid RCA to Dist RCA lesion, 10 %stenosed.  Prox RCA lesion, 20 %stenosed.  1st Mrg-2 lesion, 90 %stenosed.  1st Mrg-1 lesion, 30 %stenosed.  Mid LAD to Dist LAD lesion, 50 %stenosed.  Dist LAD lesion, 90 %stenosed.  Ost LAD to Mid LAD lesion, 20 %stenosed.   Neuro/Psych negative neurological ROS  negative psych ROS   GI/Hepatic negative GI ROS, Neg liver ROS,   Endo/Other  negative endocrine ROSneg diabetes  Renal/GU CRFRenal disease     Musculoskeletal negative musculoskeletal  ROS (+)   Abdominal (+) + obese,   Peds  Hematology negative hematology ROS (+)   Anesthesia Other Findings Past Medical History: No date: Allergy No date: CHF (congestive heart failure) (HCC)     Comment: 10/17 WATCHMAN FILTER IMPLANTED No date: Chronic kidney disease     Comment: Kidney stones No date: COPD (chronic obstructive pulmonary disease) (* No date: Dysrhythmia     Comment: WATCHMAN FILTER 10/17 No date: Glaucoma (increased eye pressure) No date: Hearing loss 4 plus years ago: History of shingles     Comment: set in left eye No date: Hypertension No date: OSA on CPAP 06/05/2016: Persistent atrial fibrillation (HCC) No date: Presence of permanent cardiac pacemaker     Comment: 2010 No date: Sebaceous cyst     Comment: Upper back No date: Shortness of breath dyspnea     Comment: with exertion No date: Sinus bradycardia     Comment: s/p MDT PPM  01/2005: Traumatic brain injury (HCC) No date: Ventricular tachycardia (HCC)     Comment: 05/2016 requiring DCCV - started on amiodarone    Reproductive/Obstetrics                             Anesthesia Physical  Anesthesia Plan  ASA: III  Anesthesia Plan: General   Post-op Pain Management:    Induction: Intravenous  Airway Management Planned: LMA  Additional Equipment:   Intra-op Plan:   Post-operative Plan:   Informed Consent: I have reviewed the patients History and Physical, chart, labs and discussed the procedure including the risks,  benefits and alternatives for the proposed anesthesia with the patient or authorized representative who has indicated his/her understanding and acceptance.   Dental advisory given  Plan Discussed with: CRNA and Anesthesiologist  Anesthesia Plan Comments:         Anesthesia Quick Evaluation

## 2016-10-14 NOTE — Transfer of Care (Signed)
Immediate Anesthesia Transfer of Care Note  Patient: Douglas Washington  Procedure(s) Performed: Procedure(s): CYSTOSCOPY WITH STENT REMOVAL (Left)  Patient Location: PACU  Anesthesia Type:General  Level of Consciousness: sedated  Airway & Oxygen Therapy: Patient Spontanous Breathing and Patient connected to face mask oxygen  Post-op Assessment: Report given to RN and Post -op Vital signs reviewed and stable  Post vital signs: Reviewed and stable  Last Vitals:  Vitals:   10/14/16 1253  BP: 139/89  Pulse: 96  Resp: 16  Temp: 36.4 C    Last Pain:  Vitals:   10/14/16 1253  TempSrc: Oral         Complications: No apparent anesthesia complications

## 2016-10-14 NOTE — Discharge Instructions (Addendum)
Kidney Stones Kidney stones (urolithiasis) are rock-like masses that form inside of the kidneys. Kidneys are organs that make pee (urine). A kidney stone can cause very bad pain and can block the flow of pee. The stone usually leaves your body (passes) through your pee. You may need to have a doctor take out the stone. Follow these instructions at home: Eating and drinking  Drink enough fluid to keep your pee clear or pale yellow. This will help you pass the stone.  If told by your doctor, change the foods you eat (your diet). This may include:  Limiting how much salt (sodium) you eat.  Eating more fruits and vegetables.  Limiting how much meat, poultry, fish, and eggs you eat.  Follow instructions from your doctor about eating or drinking restrictions. General instructions  Collect pee samples as told by your doctor. You may need to collect a pee sample:  24 hours after a stone comes out.  8-12 weeks after a stone comes out, and every 6-12 months after that.  Strain your pee every time you pee (urinate), for as long as told. Use the strainer that your doctor recommends.  Do not throw out the stone. Keep it so that it can be tested by your doctor.  Take over-the-counter and prescription medicines only as told by your doctor.  Keep all follow-up visits as told by your doctor. This is important. You may need follow-up tests. Preventing kidney stones To prevent another kidney stone:  Drink enough fluid to keep your pee clear or pale yellow. This is the best way to prevent kidney stones.  Eat healthy foods.  Avoid certain foods as told by your doctor. You may be told to eat less protein.  Stay at a healthy weight. Contact a doctor if:  You have pain that gets worse or does not get better with medicine. Get help right away if:  You have a fever or chills.  You get very bad pain.  You get new pain in your belly (abdomen).  You pass out (faint).  You cannot pee. This  information is not intended to replace advice given to you by your health care provider. Make sure you discuss any questions you have with your health care provider. Document Released: 03/17/2008 Document Revised: 06/17/2016 Document Reviewed: 06/17/2016 Elsevier Interactive Patient Education  2017 Elsevier Inc. Dietary Guidelines to Help Prevent Kidney Stones Your risk of kidney stones can be decreased by adjusting the foods you eat. The most important thing you can do is drink enough fluid. You should drink enough fluid to keep your urine clear or pale yellow. The following guidelines provide specific information for the type of kidney stone you have had. Guidelines according to type of kidney stone Calcium Oxalate Kidney Stones  Reduce the amount of salt you eat. Foods that have a lot of salt cause your body to release excess calcium into your urine. The excess calcium can combine with a substance called oxalate to form kidney stones.  Reduce the amount of animal protein you eat if the amount you eat is excessive. Animal protein causes your body to release excess calcium into your urine. Ask your dietitian how much protein from animal sources you should be eating.  Avoid foods that are high in oxalates. If you take vitamins, they should have less than 500 mg of vitamin C. Your body turns vitamin C into oxalates. You do not need to avoid fruits and vegetables high in vitamin C. Calcium Phosphate Kidney Stones  Reduce the amount of salt you eat to help prevent the release of excess calcium into your urine.  Reduce the amount of animal protein you eat if the amount you eat is excessive. Animal protein causes your body to release excess calcium into your urine. Ask your dietitian how much protein from animal sources you should be eating.  Get enough calcium from food or take a calcium supplement (ask your dietitian for recommendations). Food sources of calcium that do not increase your risk of  kidney stones include:  Broccoli.  Dairy products, such as cheese and yogurt.  Pudding. Uric Acid Kidney Stones  Do not have more than 6 oz of animal protein per day. Food sources Electrical engineer (all types).  Poultry.  Eggs.  Fish, seafood. Foods High in [employer] seasonings.  Soy sauce.  Teriyaki sauce.  Cured and processed meats.  Salted crackers and snack foods.  Fast food.  Canned soups and most canned foods. Foods High in Oxalates  Grains:  Amaranth.  Barley.  Grits.  Wheat germ.  Bran.  Buckwheat flour.  All bran cereals.  Pretzels.  Whole wheat bread.  Vegetables:  Beans (wax).  Beets and beet greens.  Collard greens.  Eggplant.  Escarole.  Leeks.  Okra.  Parsley.  Rutabagas.  Spinach.  Swiss chard.  Tomato paste.  Fried potatoes.  Sweet potatoes.  Fruits:  Red currants.  Figs.  Kiwi.  Rhubarb.  Meat and Other Protein Sources:  Beans (dried).  Soy burgers and other soybean products.  Miso.  Nuts (peanuts, almonds, pecans, cashews, hazelnuts).  Nut butters.  Sesame seeds and tahini (paste made of sesame seeds).  Poppy seeds.  Beverages:  Chocolate drink mixes.  Soy milk.  Instant iced tea.  Juices made from high-oxalate fruits or vegetables.  Other:  Carob.  Chocolate.  Fruitcake.  Marmalades. This information is not intended to replace advice given to you by your health care provider. Make sure you discuss any questions you have with your health care provider. Document Released: 01/24/2011 Document Revised: 03/06/2016 Document Reviewed: 08/26/2013 Elsevier Interactive Patient Education  2017 Elsevier Inc. Ureteral Stent Implantation, Care After Introduction Refer to this sheet in the next few weeks. These instructions provide you with information about caring for yourself after your procedure. Your health care provider may also give you more specific  instructions. Your treatment has been planned according to current medical practices, but problems sometimes occur. Call your health care provider if you have any problems or questions after your procedure. What can I expect after the procedure? After the procedure, it is common to have:  Nausea.  Mild pain when you urinate. You may feel this pain in your lower back or lower abdomen. Pain should stop within a few minutes after you urinate. This may last for up to 1 week.  A small amount of blood in your urine for several days. Follow these instructions at home:   Medicines  Take over-the-counter and prescription medicines only as told by your health care provider.  If you were prescribed an antibiotic medicine, take it as told by your health care provider. Do not stop taking the antibiotic even if you start to feel better.  Do not drive for 24 hours if you received a sedative.  Do not drive or operate heavy machinery while taking prescription pain medicines. Activity  Return to your normal activities as told by your health care provider. Ask your health care provider what activities are  safe for you.  Do not lift anything that is heavier than 10 lb (4.5 kg). Follow this limit for 1 week after your procedure, or for as long as told by your health care provider. General instructions  Watch for any blood in your urine. Call your health care provider if the amount of blood in your urine increases.  If you have a catheter:  Follow instructions from your health care provider about taking care of your catheter and collection bag.  Do not take baths, swim, or use a hot tub until your health care provider approves.  Drink enough fluid to keep your urine clear or pale yellow.  Keep all follow-up visits as told by your health care provider. This is important. Contact a health care provider if:  You have pain that gets worse or does not get better with medicine, especially pain when you  urinate.  You have difficulty urinating.  You feel nauseous or you vomit repeatedly during a period of more than 2 days after the procedure. Get help right away if:  Your urine is dark red or has blood clots in it.  You are leaking urine (have incontinence).  The end of the stent comes out of your urethra.  You cannot urinate.  You have sudden, sharp, or severe pain in your abdomen or lower back.  You have a fever. This information is not intended to replace advice given to you by your health care provider. Make sure you discuss any questions you have with your health care provider. Document Released: 06/01/2013 Document Revised: 03/06/2016 Document Reviewed: 04/13/2015  2017 Elsevier  AMBULATORY SURGERY  DISCHARGE INSTRUCTIONS   1) The drugs that you were given will stay in your system until tomorrow so for the next 24 hours you should not:  A) Drive an automobile B) Make any legal decisions C) Drink any alcoholic beverage   2) You may resume regular meals tomorrow.  Today it is better to start with liquids and gradually work up to solid foods.  You may eat anything you prefer, but it is better to start with liquids, then soup and crackers, and gradually work up to solid foods.   3) Please notify your doctor immediately if you have any unusual bleeding, trouble breathing, redness and pain at the surgery site, drainage, fever, or pain not relieved by medication.    4) Additional Instructions:        Please contact your physician with any problems or Same Day Surgery at [phone], Monday through Friday 6 am to 4 pm, or [place] at [facility] number at [phone].

## 2016-10-14 NOTE — ED Notes (Signed)
Family at bedside. 

## 2016-10-14 NOTE — H&P (Signed)
Date of Initial H&P: 10/09/16  History reviewed, patient examined, no change in status, stable for surgery.

## 2016-10-14 NOTE — OR Nursing (Signed)
Hearing aid to OR - Minna Antis RN verify that both hearing aids were in and will verify when leaving OR.

## 2016-10-14 NOTE — Op Note (Signed)
Preoperative diagnosis: 1. Left ureteral stent                                             2. Left ureterolithiasis                                             3. Left nephrolithiasis  Postoperative diagnosis: Same   Procedure: Cystoscopy with stent removal    Surgeon: Suszanne Conners. Evelene Croon MD  Anesthesia: General  Indications:See the history and physical. After informed consent the above procedure(s) were requested     Technique and findings: After adequate general anesthesia been obtained the patient was placed into dorsal lithotomy position and the perineum was prepped and draped in the usual fashion. The 52 French the scope was coupled the camera and visually advanced into the bladder. No bladder tumors were identified. The left ureteral stent was identified and engaged with the alligator forceps and removed. 10 cc of viscous Xylocaine was instilled within the urethra and the bladder. A B&O suppository was placed. The procedure was then terminated and patient transferred to the recovery room in stable condition.

## 2016-10-14 NOTE — Anesthesia Procedure Notes (Signed)
Procedure Name: LMA Insertion Date/Time: 10/14/2016 2:33 PM Performed by: Lily Kocher Pre-anesthesia Checklist: Patient identified, Patient being monitored, Timeout performed, Emergency Drugs available and Suction available Patient Re-evaluated:Patient Re-evaluated prior to inductionOxygen Delivery Method: Circle system utilized Preoxygenation: Pre-oxygenation with 100% oxygen Intubation Type: IV induction Ventilation: Mask ventilation without difficulty LMA: LMA inserted LMA Size: 5.0 Tube type: Oral Number of attempts: 1 Placement Confirmation: positive ETCO2 and breath sounds checked- equal and bilateral Tube secured with: Tape Dental Injury: Teeth and Oropharynx as per pre-operative assessment

## 2016-10-15 ENCOUNTER — Telehealth: Payer: Self-pay | Admitting: Internal Medicine

## 2016-10-15 ENCOUNTER — Ambulatory Visit: Payer: Medicare Other

## 2016-10-15 ENCOUNTER — Other Ambulatory Visit: Payer: Self-pay

## 2016-10-15 ENCOUNTER — Encounter: Payer: Self-pay | Admitting: Radiology

## 2016-10-15 ENCOUNTER — Inpatient Hospital Stay: Payer: Medicare Other

## 2016-10-15 ENCOUNTER — Emergency Department: Payer: Medicare Other

## 2016-10-15 DIAGNOSIS — Z950 Presence of cardiac pacemaker: Secondary | ICD-10-CM | POA: Diagnosis not present

## 2016-10-15 DIAGNOSIS — Z7902 Long term (current) use of antithrombotics/antiplatelets: Secondary | ICD-10-CM | POA: Diagnosis not present

## 2016-10-15 DIAGNOSIS — I481 Persistent atrial fibrillation: Secondary | ICD-10-CM | POA: Diagnosis present

## 2016-10-15 DIAGNOSIS — I340 Nonrheumatic mitral (valve) insufficiency: Secondary | ICD-10-CM | POA: Diagnosis not present

## 2016-10-15 DIAGNOSIS — R509 Fever, unspecified: Secondary | ICD-10-CM | POA: Diagnosis not present

## 2016-10-15 DIAGNOSIS — I5033 Acute on chronic diastolic (congestive) heart failure: Secondary | ICD-10-CM | POA: Diagnosis present

## 2016-10-15 DIAGNOSIS — I2699 Other pulmonary embolism without acute cor pulmonale: Secondary | ICD-10-CM | POA: Diagnosis present

## 2016-10-15 DIAGNOSIS — A419 Sepsis, unspecified organism: Secondary | ICD-10-CM | POA: Diagnosis present

## 2016-10-15 DIAGNOSIS — A4181 Sepsis due to Enterococcus: Secondary | ICD-10-CM | POA: Diagnosis present

## 2016-10-15 DIAGNOSIS — Z880 Allergy status to penicillin: Secondary | ICD-10-CM | POA: Diagnosis not present

## 2016-10-15 DIAGNOSIS — Z888 Allergy status to other drugs, medicaments and biological substances status: Secondary | ICD-10-CM | POA: Diagnosis not present

## 2016-10-15 DIAGNOSIS — I081 Rheumatic disorders of both mitral and tricuspid valves: Secondary | ICD-10-CM | POA: Diagnosis present

## 2016-10-15 DIAGNOSIS — R6521 Severe sepsis with septic shock: Secondary | ICD-10-CM

## 2016-10-15 DIAGNOSIS — T814XXA Infection following a procedure, initial encounter: Secondary | ICD-10-CM | POA: Diagnosis present

## 2016-10-15 DIAGNOSIS — Z881 Allergy status to other antibiotic agents status: Secondary | ICD-10-CM | POA: Diagnosis not present

## 2016-10-15 DIAGNOSIS — E8809 Other disorders of plasma-protein metabolism, not elsewhere classified: Secondary | ICD-10-CM | POA: Diagnosis present

## 2016-10-15 DIAGNOSIS — Z885 Allergy status to narcotic agent status: Secondary | ICD-10-CM | POA: Diagnosis not present

## 2016-10-15 DIAGNOSIS — N179 Acute kidney failure, unspecified: Secondary | ICD-10-CM | POA: Diagnosis present

## 2016-10-15 DIAGNOSIS — Z79899 Other long term (current) drug therapy: Secondary | ICD-10-CM | POA: Diagnosis not present

## 2016-10-15 DIAGNOSIS — Z882 Allergy status to sulfonamides status: Secondary | ICD-10-CM | POA: Diagnosis not present

## 2016-10-15 DIAGNOSIS — Z96642 Presence of left artificial hip joint: Secondary | ICD-10-CM | POA: Diagnosis present

## 2016-10-15 DIAGNOSIS — I130 Hypertensive heart and chronic kidney disease with heart failure and stage 1 through stage 4 chronic kidney disease, or unspecified chronic kidney disease: Secondary | ICD-10-CM | POA: Diagnosis present

## 2016-10-15 DIAGNOSIS — Z91048 Other nonmedicinal substance allergy status: Secondary | ICD-10-CM | POA: Diagnosis not present

## 2016-10-15 DIAGNOSIS — I824Z1 Acute embolism and thrombosis of unspecified deep veins of right distal lower extremity: Secondary | ICD-10-CM | POA: Diagnosis present

## 2016-10-15 DIAGNOSIS — Z87820 Personal history of traumatic brain injury: Secondary | ICD-10-CM | POA: Diagnosis not present

## 2016-10-15 DIAGNOSIS — Y92019 Unspecified place in single-family (private) house as the place of occurrence of the external cause: Secondary | ICD-10-CM | POA: Diagnosis not present

## 2016-10-15 DIAGNOSIS — Z87442 Personal history of urinary calculi: Secondary | ICD-10-CM | POA: Diagnosis not present

## 2016-10-15 DIAGNOSIS — Z87891 Personal history of nicotine dependence: Secondary | ICD-10-CM | POA: Diagnosis not present

## 2016-10-15 LAB — BLOOD CULTURE ID PANEL (REFLEXED)
Acinetobacter baumannii: NOT DETECTED
CANDIDA GLABRATA: NOT DETECTED
CANDIDA KRUSEI: NOT DETECTED
CANDIDA PARAPSILOSIS: NOT DETECTED
Candida albicans: NOT DETECTED
Candida tropicalis: NOT DETECTED
ENTEROBACTER CLOACAE COMPLEX: NOT DETECTED
ENTEROCOCCUS SPECIES: DETECTED — AB
ESCHERICHIA COLI: NOT DETECTED
Enterobacteriaceae species: NOT DETECTED
Haemophilus influenzae: NOT DETECTED
KLEBSIELLA OXYTOCA: NOT DETECTED
KLEBSIELLA PNEUMONIAE: NOT DETECTED
LISTERIA MONOCYTOGENES: NOT DETECTED
Neisseria meningitidis: NOT DETECTED
PROTEUS SPECIES: NOT DETECTED
Pseudomonas aeruginosa: NOT DETECTED
SERRATIA MARCESCENS: NOT DETECTED
STAPHYLOCOCCUS SPECIES: NOT DETECTED
STREPTOCOCCUS AGALACTIAE: NOT DETECTED
Staphylococcus aureus (BCID): NOT DETECTED
Streptococcus pneumoniae: NOT DETECTED
Streptococcus pyogenes: NOT DETECTED
Streptococcus species: NOT DETECTED
VANCOMYCIN RESISTANCE: NOT DETECTED

## 2016-10-15 LAB — HEPATIC FUNCTION PANEL
ALT: 13 U/L — ABNORMAL LOW (ref 17–63)
AST: 16 U/L (ref 15–41)
Albumin: 2.4 g/dL — ABNORMAL LOW (ref 3.5–5.0)
Alkaline Phosphatase: 45 U/L (ref 38–126)
Bilirubin, Direct: <0.1 mg/dL — ABNORMAL LOW (ref 0.1–0.5)
TOTAL PROTEIN: 4.8 g/dL — AB (ref 6.5–8.1)
Total Bilirubin: 0.8 mg/dL (ref 0.3–1.2)

## 2016-10-15 LAB — PROTIME-INR
INR: 1.21
Prothrombin Time: 15.4 seconds — ABNORMAL HIGH (ref 11.4–15.2)

## 2016-10-15 LAB — CBC
HCT: 32 % — ABNORMAL LOW (ref 40.0–52.0)
HEMOGLOBIN: 10.4 g/dL — AB (ref 13.0–18.0)
MCH: 28.5 pg (ref 26.0–34.0)
MCHC: 32.4 g/dL (ref 32.0–36.0)
MCV: 88.1 fL (ref 80.0–100.0)
PLATELETS: 198 10*3/uL (ref 150–440)
RBC: 3.63 MIL/uL — AB (ref 4.40–5.90)
RDW: 18.2 % — ABNORMAL HIGH (ref 11.5–14.5)
WBC: 14 10*3/uL — ABNORMAL HIGH (ref 3.8–10.6)

## 2016-10-15 LAB — HEPARIN LEVEL (UNFRACTIONATED)
HEPARIN UNFRACTIONATED: 0.74 [IU]/mL — AB (ref 0.30–0.70)
Heparin Unfractionated: 0.88 IU/mL — ABNORMAL HIGH (ref 0.30–0.70)

## 2016-10-15 LAB — APTT: aPTT: 88 seconds — ABNORMAL HIGH (ref 24–36)

## 2016-10-15 LAB — BASIC METABOLIC PANEL
Anion gap: 6 (ref 5–15)
BUN: 26 mg/dL — ABNORMAL HIGH (ref 6–20)
CALCIUM: 8.6 mg/dL — AB (ref 8.9–10.3)
CO2: 20 mmol/L — ABNORMAL LOW (ref 22–32)
CREATININE: 1.1 mg/dL (ref 0.61–1.24)
Chloride: 111 mmol/L (ref 101–111)
GFR, EST NON AFRICAN AMERICAN: 59 mL/min — AB (ref >60)
Glucose, Bld: 123 mg/dL — ABNORMAL HIGH (ref 65–99)
Potassium: 3.7 mmol/L (ref 3.5–5.1)
SODIUM: 137 mmol/L (ref 135–145)

## 2016-10-15 LAB — PHOSPHORUS: PHOSPHORUS: 2.2 mg/dL — AB (ref 2.5–4.6)

## 2016-10-15 LAB — MAGNESIUM: Magnesium: 1.8 mg/dL (ref 1.7–2.4)

## 2016-10-15 LAB — LACTIC ACID, PLASMA
LACTIC ACID, VENOUS: 1.4 mmol/L (ref 0.5–1.9)
Lactic Acid, Venous: 1.1 mmol/L (ref 0.5–1.9)

## 2016-10-15 LAB — MRSA PCR SCREENING: MRSA by PCR: NEGATIVE

## 2016-10-15 LAB — GLUCOSE, CAPILLARY: GLUCOSE-CAPILLARY: 109 mg/dL — AB (ref 65–99)

## 2016-10-15 MED ORDER — IOPAMIDOL (ISOVUE-370) INJECTION 76%
75.0000 mL | Freq: Once | INTRAVENOUS | Status: AC | PRN
  Administered 2016-10-15: 75 mL via INTRAVENOUS

## 2016-10-15 MED ORDER — ONDANSETRON 4 MG PO TBDP: 8.0000 mg | ORAL_TABLET | Freq: Four times a day (QID) | ORAL | Status: DC | PRN

## 2016-10-15 MED ORDER — ORAL CARE MOUTH RINSE
15.0000 mL | Freq: Two times a day (BID) | OROMUCOSAL | Status: DC
  Administered 2016-10-15 – 2016-10-17 (×5): 15 mL via OROMUCOSAL

## 2016-10-15 MED ORDER — HEPARIN SODIUM (PORCINE) 5000 UNIT/ML IJ SOLN: 5000.0000 [IU] | Freq: Three times a day (TID) | INTRAMUSCULAR | Status: DC

## 2016-10-15 MED ORDER — SODIUM CHLORIDE 0.9 % IV SOLN: 1.0000 g | Freq: Three times a day (TID) | INTRAVENOUS | Status: DC

## 2016-10-15 MED ORDER — DOCUSATE SODIUM 50 MG/5ML PO LIQD: 100.0000 mg | Freq: Two times a day (BID) | ORAL | Status: DC

## 2016-10-15 MED ORDER — DEXTROSE 5 % IV SOLN
0.0000 ug/min | INTRAVENOUS | Status: DC
  Filled 2016-10-15: qty 1

## 2016-10-15 MED ORDER — MEROPENEM-SODIUM CHLORIDE 1 GM/50ML IV SOLR
1.0000 g | Freq: Three times a day (TID) | INTRAVENOUS | Status: DC
  Administered 2016-10-15 – 2016-10-17 (×9): 1 g via INTRAVENOUS
  Filled 2016-10-15 (×12): qty 50

## 2016-10-15 MED ORDER — AMIODARONE HCL 200 MG PO TABS
100.0000 mg | ORAL_TABLET | Freq: Every day | ORAL | Status: DC
  Administered 2016-10-16 – 2016-10-19 (×3): 100 mg via ORAL
  Filled 2016-10-15 (×4): qty 1

## 2016-10-15 MED ORDER — HEPARIN (PORCINE) IN NACL 100-0.45 UNIT/ML-% IJ SOLN
1200.0000 [IU]/h | INTRAMUSCULAR | Status: DC
  Administered 2016-10-15: 1000 [IU]/h via INTRAVENOUS
  Administered 2016-10-15: 1200 [IU]/h via INTRAVENOUS
  Administered 2016-10-17: 900 [IU]/h via INTRAVENOUS
  Administered 2016-10-18: 1200 [IU]/h via INTRAVENOUS
  Administered 2016-10-18: 1050 [IU]/h via INTRAVENOUS
  Filled 2016-10-15 (×5): qty 250

## 2016-10-15 MED ORDER — TIMOLOL MALEATE 0.5 % OP SOLN
1.0000 [drp] | Freq: Two times a day (BID) | OPHTHALMIC | Status: DC
  Administered 2016-10-15 – 2016-10-19 (×9): 1 [drp] via OPHTHALMIC
  Filled 2016-10-15: qty 5

## 2016-10-15 MED ORDER — SODIUM CHLORIDE 0.9 % IV BOLUS (SEPSIS)
500.0000 mL | Freq: Once | INTRAVENOUS | Status: AC
  Administered 2016-10-15: 500 mL via INTRAVENOUS

## 2016-10-15 MED ORDER — CHLORHEXIDINE GLUCONATE 0.12 % MT SOLN
15.0000 mL | Freq: Two times a day (BID) | OROMUCOSAL | Status: DC
  Administered 2016-10-15 – 2016-10-19 (×8): 15 mL via OROMUCOSAL
  Filled 2016-10-15 (×8): qty 15

## 2016-10-15 MED ORDER — AMIODARONE HCL 200 MG PO TABS: 100.0000 mg | ORAL_TABLET | Freq: Every day | ORAL | 6 refills | Status: AC

## 2016-10-15 MED ORDER — HEPARIN BOLUS VIA INFUSION
5000.0000 [IU] | Freq: Once | INTRAVENOUS | Status: AC
  Administered 2016-10-15: 5000 [IU] via INTRAVENOUS
  Filled 2016-10-15: qty 5000

## 2016-10-15 MED ORDER — BRIMONIDINE TARTRATE 0.2 % OP SOLN
1.0000 [drp] | Freq: Two times a day (BID) | OPHTHALMIC | Status: DC
  Administered 2016-10-15 – 2016-10-19 (×9): 1 [drp] via OPHTHALMIC
  Filled 2016-10-15: qty 5

## 2016-10-15 MED ORDER — CLOPIDOGREL BISULFATE 75 MG PO TABS
75.0000 mg | ORAL_TABLET | Freq: Every day | ORAL | Status: DC
  Administered 2016-10-16: 75 mg via ORAL
  Filled 2016-10-15 (×3): qty 1

## 2016-10-15 MED ORDER — IPRATROPIUM-ALBUTEROL 0.5-2.5 (3) MG/3ML IN SOLN
3.0000 mL | RESPIRATORY_TRACT | Status: DC | PRN
  Administered 2016-10-18: 3 mL via RESPIRATORY_TRACT
  Filled 2016-10-15: qty 3

## 2016-10-15 MED ORDER — SODIUM CHLORIDE 0.9 % IV BOLUS (SEPSIS)
1000.0000 mL | Freq: Once | INTRAVENOUS | Status: AC
  Administered 2016-10-15: 1000 mL via INTRAVENOUS

## 2016-10-15 MED ORDER — DORZOLAMIDE HCL 2 % OP SOLN
1.0000 [drp] | Freq: Two times a day (BID) | OPHTHALMIC | Status: DC
  Administered 2016-10-16 – 2016-10-19 (×6): 1 [drp] via OPHTHALMIC
  Filled 2016-10-15 (×2): qty 10

## 2016-10-15 MED ORDER — VANCOMYCIN HCL IN DEXTROSE 1-5 GM/200ML-% IV SOLN
1000.0000 mg | Freq: Two times a day (BID) | INTRAVENOUS | Status: DC
  Administered 2016-10-16 – 2016-10-19 (×8): 1000 mg via INTRAVENOUS
  Filled 2016-10-15 (×10): qty 200

## 2016-10-15 MED ORDER — LATANOPROST 0.005 % OP SOLN
1.0000 [drp] | Freq: Every day | OPHTHALMIC | Status: DC
  Administered 2016-10-15 – 2016-10-18 (×4): 1 [drp] via OPHTHALMIC
  Filled 2016-10-15: qty 2.5

## 2016-10-15 MED ORDER — VANCOMYCIN HCL IN DEXTROSE 1-5 GM/200ML-% IV SOLN
1000.0000 mg | Freq: Once | INTRAVENOUS | Status: AC
  Administered 2016-10-15: 1000 mg via INTRAVENOUS
  Filled 2016-10-15: qty 200

## 2016-10-15 MED ORDER — SENNOSIDES 8.8 MG/5ML PO SYRP: 5.0000 mL | ORAL_SOLUTION | Freq: Two times a day (BID) | ORAL | Status: DC

## 2016-10-15 MED ORDER — MONTELUKAST SODIUM 10 MG PO TABS
10.0000 mg | ORAL_TABLET | Freq: Every day | ORAL | Status: DC
  Administered 2016-10-15 – 2016-10-18 (×4): 10 mg via ORAL
  Filled 2016-10-15 (×4): qty 1

## 2016-10-15 MED ORDER — GENTAMICIN SULFATE 40 MG/ML IJ SOLN
100.0000 mg | Freq: Once | INTRAMUSCULAR | Status: AC
  Administered 2016-10-15: 100 mg via INTRAVENOUS
  Filled 2016-10-15: qty 2.5

## 2016-10-15 NOTE — Progress Notes (Signed)
ANTICOAGULATION CONSULT NOTE - Initial Consult  Pharmacy Consult for heparin Indication: pulmonary embolus  Allergies  Allergen Reactions  . Penicillins Anaphylaxis and Swelling    Has patient had a PCN reaction causing immediate rash, facial/tongue/throat swelling, SOB or lightheadedness with hypotension: Yes Has patient had a PCN reaction causing severe rash involving mucus membranes or skin necrosis: No Has patient had a PCN reaction that required hospitalization No Has patient had a PCN reaction occurring within the last 10 years: No If all of the above answers are "NO", then may proceed with Cephalosporin use.   . Adhesive [Tape] Other (See Comments)    Tears skin  . Ciprofloxacin Other (See Comments)    Unknown  . Oxycodone Other (See Comments)    Hallucinations  . Simvastatin Other (See Comments)    Weakness  . Statins Other (See Comments)    Weakness, couldn't walk  . Sulfa Antibiotics Swelling    Patient Measurements: Height: 5\' 10"  (177.8 cm) Weight: 211 lb 10.3 oz (96 kg) IBW/kg (Calculated) : 73 Heparin Dosing Weight: 91.5 kg  Vital Signs: Temp: 100 F (37.8 C) (01/03 0007) Temp Source: Oral (01/03 0007) BP: 96/50 (01/03 0200) Pulse Rate: 59 (01/03 0200)  Labs:  Recent Labs  10/14/16 2057  HGB 11.5*  11.4*  HCT 34.9*  35.0*  PLT 266  252  CREATININE 1.05  TROPONINI <0.03    Estimated Creatinine Clearance: 59.8 mL/min (by C-G formula based on SCr of 1.05 mg/dL).   Medical History: Past Medical History:  Diagnosis Date  . Allergy   . Asthma   . CHF (congestive heart failure) (HCC)    10/17 WATCHMAN FILTER IMPLANTED  . Chronic kidney disease    Kidney stones  . COPD (chronic obstructive pulmonary disease) (HCC)   . Dysrhythmia    WATCHMAN FILTER 10/17  . Glaucoma (increased eye pressure)   . Hearing loss   . History of shingles 4 plus years ago   set in left eye  . Hypertension   . OSA on CPAP   . Persistent atrial fibrillation  (HCC) 06/05/2016  . Presence of permanent cardiac pacemaker    2010  . Renal insufficiency   . Sebaceous cyst    Upper back  . Shortness of breath dyspnea    with exertion  . Sinus bradycardia    s/p MDT PPM   . Traumatic brain injury (HCC) 01/2005  . Ventricular tachycardia (HCC)    05/2016 requiring DCCV - started on amiodarone     Medications:  Infusions:  . heparin    . phenylephrine (NEO-SYNEPHRINE) Adult infusion      Assessment: 87 yom cc SOB s/p ureteroscopy with stent placement and subsequent stent removal. Febrile and desating per EMS. Pharmacy consulted to dose UFH for PE.   Goal of Therapy:  Heparin level 0.3-0.7 units/ml Monitor platelets by anticoagulation protocol: Yes   Plan:  Give 5000 units bolus x 1 Start heparin infusion at 1200 units/hr Check anti-Xa level in 8 hours and daily while on heparin Continue to monitor H&H and platelets  Carola Frost, Pharm.D., BCPS Clinical Pharmacist 10/15/2016,2:30 AM

## 2016-10-15 NOTE — Progress Notes (Signed)
eLink Physician-Brief Progress Note Patient Name: Douglas Washington DOB: [birth year] MRN: [id number]   Date of Service  10/15/2016  HPI/Events of Note  BCID noted enteroccus likley vanc sens  eICU Interventions  Add vanc Keep imipenem still for PNA/ urine additiopnal sources unclear     Intervention Category Major Interventions: Sepsis - evaluation and management  Clifford Coudriet J. 10/15/2016, 7:40 PM

## 2016-10-15 NOTE — Progress Notes (Signed)
Pt back from ultrasound.

## 2016-10-15 NOTE — OR Nursing (Signed)
Pt returned to hospital last night. Admitted to ICU17 with septic shock.

## 2016-10-15 NOTE — Progress Notes (Signed)
Subjective: Patient is voiding well with clear urine. No flank pain   Objective: Vital signs in last 24 hours: Temp:  [97.1 F (36.2 C)-100.3 F (37.9 C)] 99.9 F (37.7 C) (01/03 0300) Pulse Rate:  [57-96] 60 (01/03 0500) Resp:  [11-25] 17 (01/03 0500) BP: (86-139)/(43-89) 100/53 (01/03 0500) SpO2:  [93 %-100 %] 98 % (01/03 0500) Weight:  [92.1 kg (203 lb)-96 kg (211 lb 10.3 oz)] 96 kg (211 lb 10.3 oz) (01/03 0200)  Intake/Output from previous day: 01/02 0701 - 01/03 0700 In: 1111 [I.V.:11; IV Piggyback:1100] Out: -  Intake/Output this shift: No intake/output data recorded.  Physical Exam:  General:alert and cooperative GI: No CVA tenderness    Lab Results:  Recent Labs  10/14/16 2057 10/15/16 0501  HGB 11.5*  11.4* 10.4*  HCT 34.9*  35.0* 32.0*   BMET  Recent Labs  10/14/16 2057 10/15/16 0501  NA 136 137  K 4.2 3.7  CL 107 111  CO2 24 20*  GLUCOSE 110* 123*  BUN 21* 26*  CREATININE 1.05 1.10  CALCIUM 9.0 8.6*    Recent Labs  10/15/16 0501  INR 1.21   No results for input(s): LABURIN in the last 72 hours. Results for orders placed or performed during the hospital encounter of 10/14/16  Culture, blood (routine x 2)     Status: None (Preliminary result)   Collection Time: 10/14/16  9:49 PM  Result Value Ref Range Status   Specimen Description BLOOD  RIGHT HAND  Final   Special Requests   Final    BOTTLES DRAWN AEROBIC AND ANAEROBIC  ANA AER   Culture NO GROWTH < 12 HOURS  Final   Report Status PENDING  Incomplete  Culture, blood (routine x 2)     Status: None (Preliminary result)   Collection Time: 10/14/16  9:49 PM  Result Value Ref Range Status   Specimen Description BLOOD  RIGHT AC  Final   Special Requests   Final    BOTTLES DRAWN AEROBIC AND ANAEROBIC  ANA AER   Culture NO GROWTH < 12 HOURS  Final   Report Status PENDING  Incomplete  Rapid Influenza A&B Antigens ([facility] only)     Status: None   Collection Time:  10/14/16  9:49 PM  Result Value Ref Range Status   Influenza A ([facility]) NEGATIVE NEGATIVE Final   Influenza B ([facility]) NEGATIVE NEGATIVE Final  MRSA PCR Screening     Status: None   Collection Time: 10/15/16  2:19 AM  Result Value Ref Range Status   MRSA by PCR NEGATIVE NEGATIVE Final    Comment:        The GeneXpert MRSA Assay (FDA approved for NASAL specimens only), is one component of a comprehensive MRSA colonization surveillance program. It is not intended to diagnose MRSA infection nor to guide or monitor treatment for MRSA infections.     Studies/Results: Dg Chest 2 View  Result Date: 10/14/2016 CLINICAL DATA:  Body aches and tremors EXAM: CHEST  2 VIEW COMPARISON:  06/05/2016 FINDINGS: Bilateral shoulder replacements. Left-sided dual lead pacing device, similar in appearance. No acute infiltrate or effusion. Stable mild cardiomegaly without overt failure. Atherosclerosis. No pneumothorax. Multiple old right-sided rib fractures IMPRESSION: 1. Cardiomegaly without overt failure Electronically Signed   By: Jasmine Pang M.D.   On: 10/14/2016 22:01   Ct Angio Chest Pe W And/or Wo Contrast  Result Date: 10/15/2016 CLINICAL DATA:  History of asthma and COPD EXAM: CT ANGIOGRAPHY CHEST WITH  CONTRAST TECHNIQUE: Multidetector CT imaging of the chest was performed using the standard protocol during bolus administration of intravenous contrast. Multiplanar CT image reconstructions and MIPs were obtained to evaluate the vascular anatomy. CONTRAST:  75 mL Isovue 370 intravenous COMPARISON:  Chest x-ray 10/14/2016, CT chest 01/04/2012 FINDINGS: Cardiovascular: Small filling defect within a subsegmental branch of left lower lobe pulmonary artery, series 5, image number 128 and series 9 image number 98. There are no other filling defects identified. Atherosclerotic calcifications within the aorta. No dissection. Dilation of ascending aorta up to 4.2 cm. Coronary artery calcifications. Partially  visualized pacer leads. Borderline cardiomegaly. No large pericardial effusion. Mediastinum/Nodes: Trachea is midline. No significantly enlarged mediastinal or hilar nodes. No large pericardial effusion. 1.7 cm nodule in the left lobe of the thyroid gland. Moderate hiatal hernia. Lungs/Pleura: No pneumothorax. No acute infiltrate or pleural effusion. Upper Abdomen: No acute abnormality. Musculoskeletal: Old right-sided rib fractures. No acute or suspicious bone lesions. Review of the MIP images confirms the above findings. IMPRESSION: 1. Suspect nonocclusive subsegmental left lower lobe embolus. 2. Dilation of the ascending aorta up to 4.2 cm. Recommend annual imaging followup by CTA or MRA. This recommendation follows 2010 ACCF/AHA/AATS/ACR/ASA/SCA/SCAI/SIR/STS/SVM Guidelines for the Diagnosis and Management of Patients with Thoracic Aortic Disease. Circulation. 2010; 121: [id number] 3. 1.7 cm left thyroid gland nodule Critical Value/emergent results were called by telephone at the time of interpretation on 10/15/2016 at 2:14 am to Dr. Manson Passey , who verbally acknowledged these results. Electronically Signed   By: Jasmine Pang M.D.   On: 10/15/2016 02:14    Assessment/Plan:  Status post left stent removal       Maintain good hydration to keep urine free of clots       Follow-up in the office in 1-2 weeks  Possible pulmonary embolism  Possible sepsis   LOS: 0 days   WOLFF,MICHAEL R 10/15/2016, 8:18 AM

## 2016-10-15 NOTE — Telephone Encounter (Signed)
New Message  Pt daughter is calling:  Pt is in [facility] has blood clot in lung, the hospital put him on heparin, daughter wanted to let Dr Johney Frame

## 2016-10-15 NOTE — Progress Notes (Signed)
Pt to ultrasound

## 2016-10-15 NOTE — Progress Notes (Addendum)
  PHARMACY - PHYSICIAN COMMUNICATION CRITICAL VALUE ALERT - BLOOD CULTURE IDENTIFICATION (BCID)  Results for orders placed or performed during the hospital encounter of 10/14/16  Blood Culture ID Panel (Reflexed) (Collected: 10/14/2016  9:49 PM)  Result Value Ref Range   Enterococcus species DETECTED (A) NOT DETECTED   Vancomycin resistance NOT DETECTED NOT DETECTED   Listeria monocytogenes NOT DETECTED NOT DETECTED   Staphylococcus species NOT DETECTED NOT DETECTED   Staphylococcus aureus NOT DETECTED NOT DETECTED   Streptococcus species NOT DETECTED NOT DETECTED   Streptococcus agalactiae NOT DETECTED NOT DETECTED   Streptococcus pneumoniae NOT DETECTED NOT DETECTED   Streptococcus pyogenes NOT DETECTED NOT DETECTED   Acinetobacter baumannii NOT DETECTED NOT DETECTED   Enterobacteriaceae species NOT DETECTED NOT DETECTED   Enterobacter cloacae complex NOT DETECTED NOT DETECTED   Escherichia coli NOT DETECTED NOT DETECTED   Klebsiella oxytoca NOT DETECTED NOT DETECTED   Klebsiella pneumoniae NOT DETECTED NOT DETECTED   Proteus species NOT DETECTED NOT DETECTED   Serratia marcescens NOT DETECTED NOT DETECTED   Haemophilus influenzae NOT DETECTED NOT DETECTED   Neisseria meningitidis NOT DETECTED NOT DETECTED   Pseudomonas aeruginosa NOT DETECTED NOT DETECTED   Candida albicans NOT DETECTED NOT DETECTED   Candida glabrata NOT DETECTED NOT DETECTED   Candida krusei NOT DETECTED NOT DETECTED   Candida parapsilosis NOT DETECTED NOT DETECTED   Candida tropicalis NOT DETECTED NOT DETECTED    Name of physician (or Provider) Contacted: Dr. Jerolyn Center  Changes to prescribed antibiotics required: Will add vancomycin at this time. MD would like to keep Meropenem at this time for possibility of urine source. Urine culture still in process at this time.   Delsa Bern, PharmD 10/15/2016  7:34 PM

## 2016-10-15 NOTE — Progress Notes (Signed)
ANTICOAGULATION CONSULT NOTE - Initial Consult  Pharmacy Consult for heparin Indication: pulmonary embolus  Allergies  Allergen Reactions  . Penicillins Anaphylaxis and Swelling    Has patient had a PCN reaction causing immediate rash, facial/tongue/throat swelling, SOB or lightheadedness with hypotension: Yes Has patient had a PCN reaction causing severe rash involving mucus membranes or skin necrosis: No Has patient had a PCN reaction that required hospitalization No Has patient had a PCN reaction occurring within the last 10 years: No If all of the above answers are "NO", then may proceed with Cephalosporin use.   . Adhesive [Tape] Other (See Comments)    Tears skin  . Ciprofloxacin Other (See Comments)    Unknown  . Oxycodone Other (See Comments)    Hallucinations  . Simvastatin Other (See Comments)    Weakness  . Statins Other (See Comments)    Weakness, couldn't walk  . Sulfa Antibiotics Swelling    Patient Measurements: Height: 5\' 10"  (177.8 cm) Weight: 211 lb 10.3 oz (96 kg) IBW/kg (Calculated) : 73 Heparin Dosing Weight: 91.5 kg  Vital Signs: Temp: 98.3 F (36.8 C) (01/03 1600) Temp Source: Oral (01/03 1600) BP: 102/54 (01/03 1800) Pulse Rate: 61 (01/03 0900)  Labs:  Recent Labs  10/14/16 2057 10/15/16 0501 10/15/16 1101  HGB 11.5*  11.4* 10.4*  --   HCT 34.9*  35.0* 32.0*  --   PLT 266  252 198  --   APTT  --  88*  --   LABPROT  --  15.4*  --   INR  --  1.21  --   HEPARINUNFRC  --   --  0.88*  CREATININE 1.05 1.10  --   TROPONINI <0.03  --   --     Estimated Creatinine Clearance: 57.1 mL/min (by C-G formula based on SCr of 1.1 mg/dL).   Medical History: Past Medical History:  Diagnosis Date  . Allergy   . Asthma   . CHF (congestive heart failure) (HCC)    10/17 WATCHMAN FILTER IMPLANTED  . Chronic kidney disease    Kidney stones  . COPD (chronic obstructive pulmonary disease) (HCC)   . Dysrhythmia    WATCHMAN FILTER 10/17  .  Glaucoma (increased eye pressure)   . Hearing loss   . History of shingles 4 plus years ago   set in left eye  . Hypertension   . OSA on CPAP   . Persistent atrial fibrillation (HCC) 06/05/2016  . Presence of permanent cardiac pacemaker    2010  . Renal insufficiency   . Sebaceous cyst    Upper back  . Shortness of breath dyspnea    with exertion  . Sinus bradycardia    s/p MDT PPM   . Traumatic brain injury (HCC) 01/2005  . Ventricular tachycardia (HCC)    05/2016 requiring DCCV - started on amiodarone     Medications:  Infusions:  . heparin 1,000 Units/hr (10/15/16 1800)  . phenylephrine (NEO-SYNEPHRINE) Adult infusion      Assessment: 87 yom cc SOB s/p ureteroscopy with stent placement and subsequent stent removal. Febrile and desating per EMS. Pharmacy consulted to dose UFH for PE.   Goal of Therapy:  Heparin level 0.3-0.7 units/ml Monitor platelets by anticoagulation protocol: Yes   Plan:  Decrease heparin infusion to 1000 units/hr and recheck a HL in 8 hours.   Valentina Gu, PharmD Clinical Pharmacist 10/15/2016,7:20 PM

## 2016-10-15 NOTE — Progress Notes (Signed)
Spoke with pharmacy about Trusopt eye drops for pt. Pharmacy stated that they do not have these particular eye drops at this time and pt could send the ones from home to the pharmacy to be dispensed as scheduled. Pt's wife stated that she also uses the eye drops and refused to provide the home bottle to the pharmacy. Wife informed that we would not be able to administer the eye drops, wife continued to refuse to give home drops. Pharmacy informed of same.

## 2016-10-15 NOTE — Progress Notes (Addendum)
Pharmacy Antibiotic Note  Douglas Washington is a 83 y.o. male admitted on 10/14/2016 with sepsis.  Pharmacy has been consulted for vancomycin dosing. Unclear of source at this time. Possibly urine/PNA will keep patient on Meropenem for now per MD.   Plan: Ordered vancomycin 1000mg  IV x1.  Vancomycin 1000mg  IV every 12 hours.  Goal trough 15-20 mcg/mL.  Draw trough prior to the fifth dose.  Vancomycin kinetics Ke=0.052   T1/2=  13.3 Vd=67.2  Stacked= 6hr   Height: 5\' 10"  (177.8 cm) Weight: 211 lb 10.3 oz (96 kg) IBW/kg (Calculated) : 73  Temp (24hrs), Avg:99.4 F (37.4 C), Min:98.3 F (36.8 C), Max:100.3 F (37.9 C)   Recent Labs Lab 10/14/16 2057 10/15/16 0038 10/15/16 0501  WBC 4.8  4.7  --  14.0*  CREATININE 1.05  --  1.10  LATICACIDVEN  --  1.1 1.4    Estimated Creatinine Clearance: 57.1 mL/min (by C-G formula based on SCr of 1.1 mg/dL).    Allergies  Allergen Reactions  . Penicillins Anaphylaxis and Swelling    Has patient had a PCN reaction causing immediate rash, facial/tongue/throat swelling, SOB or lightheadedness with hypotension: Yes Has patient had a PCN reaction causing severe rash involving mucus membranes or skin necrosis: No Has patient had a PCN reaction that required hospitalization No Has patient had a PCN reaction occurring within the last 10 years: No If all of the above answers are "NO", then may proceed with Cephalosporin use.   . Adhesive [Tape] Other (See Comments)    Tears skin  . Ciprofloxacin Other (See Comments)    Unknown  . Oxycodone Other (See Comments)    Hallucinations  . Simvastatin Other (See Comments)    Weakness  . Statins Other (See Comments)    Weakness, couldn't walk  . Sulfa Antibiotics Swelling    Antimicrobials this admission: Meropenem 1/3 >> Gentamycin 1/3>> 1/3 Vancomycin 1/3 >>  Microbiology results: 1/2 BCx: enterococcus species, Van A/B not detected 1/2 UCx: in process 1/2 MRSA PCR: negative 1/2 Influenza:  negative   Thank you for allowing pharmacy to be a part of this patient's care.  Delsa Bern 10/15/2016 7:53 PM

## 2016-10-15 NOTE — Progress Notes (Signed)
eLink Physician-Brief Progress Note Patient Name: HARTWELL BURKEEN DOB: 10-19-1928 MRN: [id number]   Date of Service  10/15/2016  HPI/Events of Note  Hypotension - BP = 88/45. Last LVEF = 55% to 60%.  eICU Interventions  Will order: 1. 0.9 NaCl 1 liter IV over 1 hour now. 2. Phenylephrine IV infusion. Titrate to MAP >= 65.     Intervention Category Major Interventions: Hypotension - evaluation and management  Camreigh Michie Eugene 10/15/2016, 1:48 AM

## 2016-10-15 NOTE — H&P (Signed)
PULMONARY / CRITICAL CARE MEDICINE   Name: SASUKE RHYAN MRN: [id number] DOB: 05/26/1929    ADMISSION DATE:  10/14/2016 CONSULTATION DATE:  10/15/16  REFERRING MD:  Dr. Arnaldo Natal  CHIEF COMPLAINT:  Shortness of breath  Patient came to [facility] for stent removal.  Patient was discharged home on 10/14/16. Around 7 pm on 10/15/16 he started feeling cold with chills and shaking.  Patient had a temp of 100.67f  EMS was called and his sats were down to 80'S. Patient was placed on O2 and PCCM team was called to admit the patient.  ASSESSMENT / PLAN:  PULMONARY A: Pulmonary Embolus - Subsegmental Lower lobe COPD Sleep Apnea -uses CPAP P:   Support with O2 to keep sats >88% Bronchodilators PRN Bipap PRN; cpap qhs. Continue Singulair Heparin per pharmacy consult past surgical history that includes Craniotomy (03/2005);    CARDIOVASCULAR A:  Hx of CHF Hypotension secondary to sepsis Pacemaker Hx of Afib P:  Continuous Telemetry Keep MAP goals>65 May need to start pressors  Will hold Coreg Continue Amiodarone Continue Plavix Will obtain ECHO Will obtain dopplers of lower extremities Received 1.5L bolus in ER   RENAL A:   Chronic Kidney Disease Hx of Urethral and renal stones -s/ stent removal on 1/2 Hypoalbuminemia P:   Replace electrolytes per Usual guideline Followed By Dr. Evelene Croon Follow Chemistry Continue Meropenem  Will follow cutures   GASTROINTESTINAL A:   No active issues P:   Will keep Npo for now   HEMATOLOGIC A:   No active issues P:  Heparin for DVT prophylaxis Will Transfuse per usual guidelines  INFECTIOUS A:   Sepsis secondary to Urine P:   Monitor fever curve Follow cultures Continue Meropenem Follow CBC Lactic acid-1.1   CULTURES: 10/14/16 BC>>negative.  10/14/16 UC>>negative to date.  10/14/16 Rapid influenza A and B >> Negative MRSA PCR 1/3; negative.   ANTIBIOTICS:  10/15/16 Gentamycin X1 dose for the procedure 10/15/16  Meropenem>>   ENDOCRINE A:   No active issues P:   BS intermittently with BMP  NEUROLOGIC A:   No active issues P:   Minimize sedating drugs Reorient as needed.  STUDIES:  09/12/16 ECHO TEE>>The cavity size was normal. Wall thickness was  normal. Systolic function was normal. The estimated ejection  fraction was in the range of 55% to 60%. 10/15/16 CT angio>> Sub segmental lower lobe embolus  SIGNIFICANT EVENTS: 10/15/16>> patient admitted to the ICU possibly due to sepsis secondary to Urine  LINES/TUBES: None    FAMILY  - Updates: Wife was updated regarding the plan of care by NP   ------------------------------------------------------------------  HISTORY OF PRESENT ILLNESS:   Harvard Quattrochi is an 83 yo male with multiple co-morbidities including CHF, COPD, Dysrhythmia, Chronic Kidney Disease, Hypertension, OSA on CPAP, Atrial Fibrillation, presence of cardiac pacemaker.  Patient had obstructive left urethral stone and renal stones who underwent ureteroscopy with stent placement followed by lithotripsy. Patient came to [facility] for stent removal.  Patient was discharged home on 10/14/16. Around 7 pm on 10/15/16 he started feeling cold with chills and shaking.  Patient had a temp of 100.66f  EMS was called and his sats were down to 80'S. Patient was placed on O2 and PCCM team was called to admit the patient.  PAST MEDICAL HISTORY :  He  has a past medical history of Allergy; Asthma; CHF (congestive heart failure) (HCC); Chronic kidney disease; COPD (chronic obstructive pulmonary disease) (HCC); Dysrhythmia; Glaucoma (increased eye pressure); Hearing loss; History of  shingles (4 plus years ago); Hypertension; OSA on CPAP; Persistent atrial fibrillation (HCC) (06/05/2016); Presence of permanent cardiac pacemaker; Renal insufficiency; Sebaceous cyst; Shortness of breath dyspnea; Sinus bradycardia; Traumatic brain injury (HCC) (01/2005); and Ventricular tachycardia (HCC).  PAST SURGICAL HISTORY: He   has a past surgical history that includes Craniotomy (03/2005); Shoulder acromioplasty (Bilateral); Total hip arthroplasty (Left, 2005); Cardiac catheterization (N/A, 06/06/2016); Pacemaker insertion (2011); Insert / replace / remove pacemaker; Joint replacement; Appendectomy; Ureteroscopy with holmium laser lithotripsy (Left, 07/15/2016); Cystoscopy with stent placement (Left, 07/15/2016); TEE without cardioversion (N/A, 07/21/2016); Left Atrial Appendage Occlusion (07/31/2016); Left Atrial Appendage Occlusion (N/A, 07/31/2016); TEE without cardioversion (N/A, 09/12/2016); and Extracorporeal shock wave lithotripsy (Left, 10/02/2016).  Allergies  Allergen Reactions  . Penicillins Anaphylaxis and Swelling    Has patient had a PCN reaction causing immediate rash, facial/tongue/throat swelling, SOB or lightheadedness with hypotension: Yes Has patient had a PCN reaction causing severe rash involving mucus membranes or skin necrosis: No Has patient had a PCN reaction that required hospitalization No Has patient had a PCN reaction occurring within the last 10 years: No If all of the above answers are "NO", then may proceed with Cephalosporin use.   . Adhesive [Tape] Other (See Comments)    Tears skin  . Ciprofloxacin Other (See Comments)    Unknown  . Oxycodone Other (See Comments)    Hallucinations  . Simvastatin Other (See Comments)    Weakness  . Statins Other (See Comments)    Weakness, couldn't walk  . Sulfa Antibiotics Swelling    No current facility-administered medications on file prior to encounter.    Current Outpatient Prescriptions on File Prior to Encounter  Medication Sig  . albuterol (PROVENTIL HFA;VENTOLIN HFA) 108 (90 BASE) MCG/ACT inhaler Inhale 2 puffs into the lungs every 6 (six) hours as needed for wheezing or shortness of breath.  Marland Kitchen amiodarone (PACERONE) 100 MG tablet Take 1 tablet (100 mg total) by mouth daily.  . brimonidine (ALPHAGAN) 0.2 % ophthalmic solution Place 1 drop  into the left eye 2 (two) times daily.  . carvedilol (COREG) 3.125 MG tablet Take 1 tablet (3.125 mg total) by mouth 2 (two) times daily with a meal.  . cetirizine (ZYRTEC) 10 MG tablet Take 10 mg by mouth daily as needed for allergies.   Marland Kitchen docusate sodium (COLACE) 100 MG capsule Take 2 capsules (200 mg total) by mouth 2 (two) times daily.  . dorzolamide (TRUSOPT) 2 % ophthalmic solution Place 1 drop into both eyes 2 (two) times daily.  . Glucosamine-Chondroit-Vit C-Mn (GLUCOSAMINE 1500 COMPLEX) CAPS Take 1 capsule by mouth daily.  Marland Kitchen latanoprost (XALATAN) 0.005 % ophthalmic solution Place 1 drop into both eyes at bedtime.  Marland Kitchen MEGARED OMEGA-3 KRILL OIL 500 MG CAPS Take 500 mg by mouth daily.   . montelukast (SINGULAIR) 10 MG tablet Take 10 mg by mouth at bedtime.  . Multiple Vitamins-Minerals (MULTIVITAMIN WITH MINERALS) tablet Take 1 tablet by mouth daily.  . timolol (TIMOPTIC) 0.5 % ophthalmic solution Place 1 drop into the left eye 2 (two) times daily.  . valACYclovir (VALTREX) 500 MG tablet Take 500 mg by mouth daily.  . clopidogrel (PLAVIX) 75 MG tablet Take 75 mg by mouth daily.  Marland Kitchen erythromycin ophthalmic ointment Place 1 application into both eyes at bedtime.   . ondansetron (ZOFRAN ODT) 8 MG disintegrating tablet Take 1 tablet (8 mg total) by mouth every 6 (six) hours as needed for nausea or vomiting. (Patient not taking: Reported on 10/09/2016)  FAMILY HISTORY:  His indicated that his mother is deceased. He indicated that his father is deceased. He indicated that the status of his brother is unknown. He indicated that his maternal grandmother is deceased. He indicated that his maternal grandfather is deceased. He indicated that his paternal grandmother is deceased. He indicated that his paternal grandfather is deceased.    SOCIAL HISTORY: He  reports that he quit smoking about 42 years ago. He quit smokeless tobacco use about 42 years ago. He reports that he does not drink alcohol or  use drugs.  REVIEW OF SYSTEMS:   Review of Systems  Constitutional: Positive for chills and fever. Negative for diaphoresis and weight loss.  HENT: Negative for congestion, ear discharge, ear pain, sinus pain and tinnitus.   Eyes: Negative for photophobia, pain and discharge.  Respiratory: Positive for shortness of breath. Negative for hemoptysis, sputum production and stridor.   Cardiovascular: Negative for palpitations, orthopnea, claudication and leg swelling.  Gastrointestinal: Negative for abdominal pain, blood in stool, constipation, diarrhea, heartburn, nausea and vomiting.  Genitourinary: Negative for frequency and urgency.  Musculoskeletal: Negative for back pain and joint pain.  Skin: Negative for itching and rash.  Neurological: Negative for tingling, sensory change, speech change, focal weakness, seizures, loss of consciousness and weakness.  Psychiatric/Behavioral: Negative for hallucinations and substance abuse. The patient is not nervous/anxious and does not have insomnia.      SUBJECTIVE:  Patient states that he is still cold but feels better.  Discussed code status with the patient and the patient wants CPR but no intubation in the event of cardio-respiratory arrest.  VITAL SIGNS: BP (!) 97/58   Pulse (!) 57   Temp 100 F (37.8 C) (Oral)   Resp (!) 23   SpO2 98%   HEMODYNAMICS:    VENTILATOR SETTINGS:    INTAKE / OUTPUT: No intake/output data recorded.  PHYSICAL EXAMINATION: General: Pleasantly appearing , elder gentleman, in no acute distress Neuro: Awake, Alert, oriented, no focal deficits noted HEENT:  AT, [place], no discharge,HOH,PERRLA Cardiovascular:  Paced, no MRG noted Lungs:  Clear bilaterally , no wheezes, crackles, rhonchi noted Abdomen: soft, non tender, active bowel sounds Musculoskeletal:  No inflammation/deformity noted Skin:  Grossly intact  LABS:  BMET  Recent Labs Lab 10/14/16 2057  NA 136  K 4.2  CL 107  CO2 24  BUN 21*   CREATININE 1.05  GLUCOSE 110*    Electrolytes  Recent Labs Lab 10/14/16 2057  CALCIUM 9.0    CBC  Recent Labs Lab 10/14/16 2057  WBC 4.8  4.7  HGB 11.5*  11.4*  HCT 34.9*  35.0*  PLT 266  252    Coag's No results for input(s): APTT, INR in the last 168 hours.  Sepsis Markers No results for input(s): LATICACIDVEN, PROCALCITON, O2SATVEN in the last 168 hours.  ABG No results for input(s): PHART, PCO2ART, PO2ART in the last 168 hours.  Liver Enzymes No results for input(s): AST, ALT, ALKPHOS, BILITOT, ALBUMIN in the last 168 hours.  Cardiac Enzymes  Recent Labs Lab 10/14/16 2057  TROPONINI <0.03    Glucose No results for input(s): GLUCAP in the last 168 hours.  Imaging Dg Chest 2 View  Result Date: 10/14/2016 CLINICAL DATA:  Body aches and tremors EXAM: CHEST  2 VIEW COMPARISON:  06/05/2016 FINDINGS: Bilateral shoulder replacements. Left-sided dual lead pacing device, similar in appearance. No acute infiltrate or effusion. Stable mild cardiomegaly without overt failure. Atherosclerosis. No pneumothorax. Multiple old right-sided rib fractures IMPRESSION:  1. Cardiomegaly without overt failure Electronically Signed   By: Jasmine Pang M.D.   On: 10/14/2016 22:01     Bincy Varughese,AG-ACNP Pulmonary and Critical Care Medicine [facility]   10/15/2016, 1:03 AM  Patient seen and examined, I concur with the findings, assessment and plan as documented by the NP, and as amended by me above. Continue CPAP daily at bedtime, continue heparin drip.  Wells Guiles, M.D.  10/15/2016  Critical Care Attestation.  I have personally obtained a history, examined the patient, evaluated laboratory and imaging results, formulated the assessment and plan and placed orders. The Patient requires high complexity decision making for assessment and support, frequent evaluation and titration of therapies, application of advanced monitoring technologies and extensive  interpretation of multiple databases. The patient has critical illness that could lead imminently to failure of 1 or more organ systems and requires the highest level of physician preparedness to intervene.  Critical Care Time devoted to patient care services described in this note is 45 minutes and is exclusive of time spent in procedures.

## 2016-10-15 NOTE — Telephone Encounter (Signed)
Will forward to [employer] and Dr Johney Frame

## 2016-10-16 ENCOUNTER — Telehealth: Payer: Self-pay | Admitting: *Deleted

## 2016-10-16 ENCOUNTER — Encounter: Payer: Self-pay | Admitting: Nurse Practitioner

## 2016-10-16 LAB — CBC
HEMATOCRIT: 29.3 % — AB (ref 40.0–52.0)
Hemoglobin: 9.6 g/dL — ABNORMAL LOW (ref 13.0–18.0)
MCH: 28 pg (ref 26.0–34.0)
MCHC: 32.7 g/dL (ref 32.0–36.0)
MCV: 85.8 fL (ref 80.0–100.0)
Platelets: 174 10*3/uL (ref 150–440)
RBC: 3.42 MIL/uL — ABNORMAL LOW (ref 4.40–5.90)
RDW: 18.2 % — AB (ref 11.5–14.5)
WBC: 9.3 10*3/uL (ref 3.8–10.6)

## 2016-10-16 LAB — HEPARIN LEVEL (UNFRACTIONATED)
Heparin Unfractionated: 0.51 IU/mL (ref 0.30–0.70)
Heparin Unfractionated: 0.41 IU/mL (ref 0.30–0.70)

## 2016-10-16 LAB — URINE CULTURE

## 2016-10-16 LAB — CREATININE, SERUM
Creatinine, Ser: 0.99 mg/dL (ref 0.61–1.24)
GFR calc Af Amer: >60 mL/min (ref >60)

## 2016-10-16 MED ORDER — ACETAMINOPHEN 325 MG PO TABS
650.0000 mg | ORAL_TABLET | Freq: Four times a day (QID) | ORAL | Status: DC | PRN
  Administered 2016-10-16 (×2): 650 mg via ORAL
  Filled 2016-10-16 (×2): qty 2

## 2016-10-16 MED ORDER — CARVEDILOL 6.25 MG PO TABS
3.1250 mg | ORAL_TABLET | Freq: Two times a day (BID) | ORAL | Status: DC
  Administered 2016-10-16 – 2016-10-19 (×7): 3.125 mg via ORAL
  Filled 2016-10-16 (×8): qty 1

## 2016-10-16 NOTE — Anesthesia Postprocedure Evaluation (Signed)
Anesthesia Post Note  Patient: Douglas Washington  Procedure(s) Performed: Procedure(s) (LRB): CYSTOSCOPY WITH STENT REMOVAL (Left)  Patient location during evaluation: PACU Anesthesia Type: General Level of consciousness: awake and alert and oriented Pain management: pain level controlled Vital Signs Assessment: post-procedure vital signs reviewed and stable Respiratory status: spontaneous breathing, nonlabored ventilation and respiratory function stable Cardiovascular status: blood pressure returned to baseline and stable Postop Assessment: no signs of nausea or vomiting Anesthetic complications: no     Last Vitals:  Vitals:   10/14/16 1548 10/14/16 1618  BP: 119/70 118/66  Pulse: 73 71  Resp: 16 16  Temp: 36.6 C     Last Pain:  Vitals:   10/14/16 1618  TempSrc:   PainSc: 0-No pain                 Laranda Burkemper

## 2016-10-16 NOTE — Progress Notes (Signed)
ANTICOAGULATION CONSULT NOTE - Initial Consult  Pharmacy Consult for heparin Indication: pulmonary embolus  Allergies  Allergen Reactions  . Penicillins Anaphylaxis and Swelling    Has patient had a PCN reaction causing immediate rash, facial/tongue/throat swelling, SOB or lightheadedness with hypotension: Yes Has patient had a PCN reaction causing severe rash involving mucus membranes or skin necrosis: No Has patient had a PCN reaction that required hospitalization No Has patient had a PCN reaction occurring within the last 10 years: No If all of the above answers are "NO", then may proceed with Cephalosporin use.   . Adhesive [Tape] Other (See Comments)    Tears skin  . Ciprofloxacin Other (See Comments)    Unknown  . Oxycodone Other (See Comments)    Hallucinations  . Simvastatin Other (See Comments)    Weakness  . Statins Other (See Comments)    Weakness, couldn't walk  . Sulfa Antibiotics Swelling    Patient Measurements: Height: 5\' 10"  (177.8 cm) Weight: 211 lb 10.3 oz (96 kg) IBW/kg (Calculated) : 73 Heparin Dosing Weight: 91.5 kg  Vital Signs: Temp: 98.2 F (36.8 C) (01/03 2100) Temp Source: Oral (01/03 2100) BP: 108/59 (01/03 2340) Pulse Rate: 58 (01/03 2340)  Labs:  Recent Labs  10/14/16 2057 10/15/16 0501 10/15/16 1101 10/15/16 2222  HGB 11.5*  11.4* 10.4*  --   --   HCT 34.9*  35.0* 32.0*  --   --   PLT 266  252 198  --   --   APTT  --  88*  --   --   LABPROT  --  15.4*  --   --   INR  --  1.21  --   --   HEPARINUNFRC  --   --  0.88* 0.74*  CREATININE 1.05 1.10  --   --   TROPONINI <0.03  --   --   --     Estimated Creatinine Clearance: 57.1 mL/min (by C-G formula based on SCr of 1.1 mg/dL).   Medical History: Past Medical History:  Diagnosis Date  . Allergy   . Asthma   . CHF (congestive heart failure) (HCC)    10/17 WATCHMAN FILTER IMPLANTED  . Chronic kidney disease    Kidney stones  . COPD (chronic obstructive pulmonary  disease) (HCC)   . Dysrhythmia    WATCHMAN FILTER 10/17  . Glaucoma (increased eye pressure)   . Hearing loss   . History of shingles 4 plus years ago   set in left eye  . Hypertension   . OSA on CPAP   . Persistent atrial fibrillation (HCC) 06/05/2016  . Presence of permanent cardiac pacemaker    2010  . Renal insufficiency   . Sebaceous cyst    Upper back  . Shortness of breath dyspnea    with exertion  . Sinus bradycardia    s/p MDT PPM   . Traumatic brain injury (HCC) 01/2005  . Ventricular tachycardia (HCC)    05/2016 requiring DCCV - started on amiodarone     Medications:  Infusions:  . heparin 1,000 Units/hr (10/15/16 2113)  . phenylephrine (NEO-SYNEPHRINE) Adult infusion      Assessment: 87 yom cc SOB s/p ureteroscopy with stent placement and subsequent stent removal. Febrile and desating per EMS. Pharmacy consulted to dose UFH for PE.   Goal of Therapy:  Heparin level 0.3-0.7 units/ml Monitor platelets by anticoagulation protocol: Yes   Plan:  Decrease heparin infusion to 1000 units/hr and recheck a HL in 8  hours.   01/03 2222 HL supratherapeutic. Decrease to 900 units/hr and recheck HL in 8 hours.  Carola Frost, Pharm.D., BCPS Clinical Pharmacist 10/16/2016,12:26 AM

## 2016-10-16 NOTE — Progress Notes (Signed)
Pharmacy Antibiotic Note  Douglas Washington is a 83 y.o. male admitted on 10/14/2016 with sepsis.  Pharmacy has been consulted for vancomycin and meropenem dosing dosing. Patient to be seen by ID.   Plan: Vancomycin 1g IV Q12hr for goal trough of 15-20. Will obtain trough prior to afternoon dose on 1/5.   Meropenem 1g IV Q8hr; per primary team will continue meropenem for possible alternate infection and source.   Height: 5\' 10"  (177.8 cm) Weight: 203 lb 7.8 oz (92.3 kg) IBW/kg (Calculated) : 73  Temp (24hrs), Avg:98.5 F (36.9 C), Min:98.2 F (36.8 C), Max:98.7 F (37.1 C)   Recent Labs Lab 10/14/16 2057 10/15/16 0038 10/15/16 0501 10/16/16 0858  WBC 4.8  4.7  --  14.0* 9.3  CREATININE 1.05  --  1.10 0.99  LATICACIDVEN  --  1.1 1.4  --     Estimated Creatinine Clearance: 62.3 mL/min (by C-G formula based on SCr of 0.99 mg/dL).    Allergies  Allergen Reactions  . Penicillins Anaphylaxis and Swelling    Has patient had a PCN reaction causing immediate rash, facial/tongue/throat swelling, SOB or lightheadedness with hypotension: Yes Has patient had a PCN reaction causing severe rash involving mucus membranes or skin necrosis: No Has patient had a PCN reaction that required hospitalization No Has patient had a PCN reaction occurring within the last 10 years: No If all of the above answers are "NO", then may proceed with Cephalosporin use.   . Adhesive [Tape] Other (See Comments)    Tears skin  . Ciprofloxacin Other (See Comments)    Unknown  . Oxycodone Other (See Comments)    Hallucinations  . Simvastatin Other (See Comments)    Weakness  . Statins Other (See Comments)    Weakness, couldn't walk  . Sulfa Antibiotics Swelling    Antimicrobials this admission: Meropenem 1/3 >> Gentamycin 1/3>> 1/3 Vancomycin 1/3 >>  Microbiology results: 1/2 BCx: enterococcus species, Van A/B not detected 1/2 UCx: multiple species present  1/2 MRSA PCR: negative 1/2 Influenza:  negative  Pharmacy will continue to monitor and adjust per consult.   Simpson,Michael L 10/16/2016 12:02 PM

## 2016-10-16 NOTE — Consult Note (Signed)
[facility]     Reason for Consult: Enterococcal bacteremia    Referring Physician:  Date of Admission:  10/14/2016   Active Problems:   Septic shock ([place])   HPI: Douglas Washington is a 83 y.o. male admitted with PE and sepsis after having ureteral stent removed following lithotripsy and stent placement for L obstructive ureteral stone in Dec.  He decompensated folllowing proccedure and found to have PE and BC + enterococcus - appears only 1 sample done. He is pcn allergic.  He also has a history of A. fib and has had a watchman implanted in October of this year.  He did have follow-up with cardiology and had a negative TTE last month   Past Medical History:  Diagnosis Date  . Allergy   . Asthma   . CHF (congestive heart failure) ([place])    10/17 WATCHMAN FILTER IMPLANTED  . Chronic kidney disease    Kidney stones  . COPD (chronic obstructive pulmonary disease) ([place])   . Dysrhythmia    WATCHMAN FILTER 10/17  . Glaucoma (increased eye pressure)   . Hearing loss   . History of shingles 4 plus years ago   set in left eye  . Hypertension   . OSA on CPAP   . Persistent atrial fibrillation ([place]) 06/05/2016  . Presence of permanent cardiac pacemaker    2010  . Renal insufficiency   . Sebaceous cyst    Upper back  . Shortness of breath dyspnea    with exertion  . Sinus bradycardia    s/p MDT PPM   . Traumatic brain injury ([place]) 01/2005  . Ventricular tachycardia ([place])    05/2016 requiring DCCV - started on amiodarone    Past Surgical History:  Procedure Laterality Date  . APPENDECTOMY    . CARDIAC CATHETERIZATION N/A 06/06/2016   Procedure: Left Heart Cath and Coronary Angiography;  Surgeon: Burnell Blanks, MD;  Location: [place] [facility];  Service: Cardiovascular;  Laterality: N/A;  . CRANIOTOMY  03/2005  . CYSTOSCOPY W/ URETERAL STENT REMOVAL Left 10/14/2016   Procedure: CYSTOSCOPY WITH STENT REMOVAL;  Surgeon: Royston Cowper, MD;  Location: [facility];   Service: Urology;  Laterality: Left;  . CYSTOSCOPY WITH STENT PLACEMENT Left 07/15/2016   Procedure: CYSTOSCOPY WITH STENT PLACEMENT;  Surgeon: Royston Cowper, MD;  Location: [facility];  Service: Urology;  Laterality: Left;  . EXTRACORPOREAL SHOCK WAVE LITHOTRIPSY Left 10/02/2016   Procedure: EXTRACORPOREAL SHOCK WAVE LITHOTRIPSY (ESWL);  Surgeon: Royston Cowper, MD;  Location: [facility];  Service: Urology;  Laterality: Left;  . INSERT / REPLACE / REMOVE PACEMAKER    . JOINT REPLACEMENT    . LEFT ATRIAL APPENDAGE OCCLUSION  07/31/2016  . LEFT ATRIAL APPENDAGE OCCLUSION N/A 07/31/2016   Procedure: LEFT ATRIAL APPENDAGE OCCLUSION;  Surgeon: Sherren Mocha, MD;  Location: [place] [facility];  Service: Cardiovascular;  Laterality: N/A;  . PACEMAKER INSERTION  2011   MDT dual chamber PPM followed by [facility] implanted for sinus bradycardia  . SHOULDER ACROMIOPLASTY Bilateral   . TEE WITHOUT CARDIOVERSION N/A 07/21/2016   Procedure: TRANSESOPHAGEAL ECHOCARDIOGRAM (TEE);  Surgeon: Skeet Latch, MD;  Location: [place];  Service: Cardiovascular;  Laterality: N/A;  . TEE WITHOUT CARDIOVERSION N/A 09/12/2016   Procedure: TRANSESOPHAGEAL ECHOCARDIOGRAM (TEE);  Surgeon: Josue Hector, MD;  Location: [place];  Service: Cardiovascular;  Laterality: N/A;  . TOTAL HIP ARTHROPLASTY Left 2005  . URETEROSCOPY WITH HOLMIUM LASER LITHOTRIPSY Left 07/15/2016  Procedure: URETEROSCOPY WITH HOLMIUM LASER LITHOTRIPSY;  Surgeon: Royston Cowper, MD;  Location: [facility];  Service: Urology;  Laterality: Left;   Social History  Substance Use Topics  . Smoking status: Former Smoker    Quit date: 04/14/1974  . Smokeless tobacco: Former Systems developer    Quit date: 04/14/1974  . Alcohol use No   Family History  Problem Relation Age of Onset  . Diabetes Mother   . Heart disease Father   . Heart disease Brother     Allergies:  Allergies  Allergen Reactions  . Penicillins Anaphylaxis and Swelling    Has patient had  a PCN reaction causing immediate rash, facial/tongue/throat swelling, SOB or lightheadedness with hypotension: Yes Has patient had a PCN reaction causing severe rash involving mucus membranes or skin necrosis: No Has patient had a PCN reaction that required hospitalization No Has patient had a PCN reaction occurring within the last 10 years: No If all of the above answers are "NO", then may proceed with Cephalosporin use.   . Adhesive [Tape] Other (See Comments)    Tears skin  . Ciprofloxacin Other (See Comments)    Unknown  . Oxycodone Other (See Comments)    Hallucinations  . Simvastatin Other (See Comments)    Weakness  . Statins Other (See Comments)    Weakness, couldn't walk  . Sulfa Antibiotics Swelling    Current antibiotics: Antibiotics Given (last 72 hours)    Date/Time Action Medication Dose Rate   10/15/16 2101 Given   vancomycin (VANCOCIN) IVPB 1000 mg/200 mL premix 1,000 mg 200 mL/hr   10/16/16 0429 Given   vancomycin (VANCOCIN) IVPB 1000 mg/200 mL premix 1,000 mg 200 mL/hr      MEDICATIONS: . amiodarone  100 mg Oral Daily  . brimonidine  1 drop Left Eye BID  . carvedilol  3.125 mg Oral BID WC  . chlorhexidine  15 mL Mouth Rinse BID  . clopidogrel  75 mg Oral Daily  . dorzolamide  1 drop Both Eyes BID  . latanoprost  1 drop Both Eyes QHS  . mouth rinse  15 mL Mouth Rinse q12n4p  . meropenem  1 g Intravenous Q8H  . montelukast  10 mg Oral QHS  . timolol  1 drop Left Eye BID  . vancomycin  1,000 mg Intravenous Q12H    Review of Systems - 11 systems reviewed and negative per HPI   OBJECTIVE: Temp:  [98.1 F (36.7 C)-98.7 F (37.1 C)] 98.1 F (36.7 C) (01/04 1214) Pulse Rate:  [55-80] 72 (01/04 1214) Resp:  [14-23] 17 (01/04 1214) BP: (94-119)/(50-70) 94/58 (01/04 1214) SpO2:  [93 %-97 %] 95 % (01/04 1214) Weight:  [92.3 kg (203 lb 7.8 oz)] 92.3 kg (203 lb 7.8 oz) (01/04 0444) Physical Exam  Constitutional: He is oriented to person, place, and time.  He appears well-developed and well-nourished. No distress.  HENT: anicteric Mouth/Throat: Oropharynx is clear and moist. No oropharyngeal exudate.  Cardiovascular: Normal rate, regular rhythm and normal heart sounds. 2/6 sm Pulmonary/Chest: Effort normal and breath sounds normal. No respiratory distress. He has no wheezes.  PPM site - wnl Abdominal: Soft. Bowel sounds are normal. He exhibits no distension. There is no tenderness.  Lymphadenopathy: He has no cervical adenopathy.  Neurological: He is alert and oriented to person, place, and time.  Skin: Skin is warm and dry. No rash noted. No erythema.  Psychiatric: He has a normal mood and affect. His behavior is normal.   LABS: Results for orders  placed or performed during the hospital encounter of 10/14/16 (from the past 48 hour(s))  Basic metabolic panel     Status: Abnormal   Collection Time: 10/14/16  8:57 PM  Result Value Ref Range   Sodium 136 135 - 145 mmol/L   Potassium 4.2 3.5 - 5.1 mmol/L   Chloride 107 101 - 111 mmol/L   CO2 24 22 - 32 mmol/L   Glucose, Bld 110 (H) 65 - 99 mg/dL   BUN 21 (H) 6 - 20 mg/dL   Creatinine, Ser 1.05 0.61 - 1.24 mg/dL   Calcium 9.0 8.9 - 10.3 mg/dL   GFR calc non Af Amer >60 >60 mL/min   GFR calc Af Amer >60 >60 mL/min    Comment: (NOTE) The eGFR has been calculated using the CKD EPI equation. This calculation has not been validated in all clinical situations. eGFR's persistently <60 mL/min signify possible Chronic Kidney Disease.    Anion gap 5 5 - 15  CBC     Status: Abnormal   Collection Time: 10/14/16  8:57 PM  Result Value Ref Range   WBC 4.7 3.8 - 10.6 K/uL   RBC 4.05 (L) 4.40 - 5.90 MIL/uL   Hemoglobin 11.4 (L) 13.0 - 18.0 g/dL   HCT 35.0 (L) 40.0 - 52.0 %   MCV 86.4 80.0 - 100.0 fL   MCH 28.1 26.0 - 34.0 pg   MCHC 32.6 32.0 - 36.0 g/dL   RDW 18.4 (H) 11.5 - 14.5 %   Platelets 252 150 - 440 K/uL  Troponin I     Status: None   Collection Time: 10/14/16  8:57 PM  Result Value  Ref Range   Troponin I <0.03 <0.03 ng/mL  CBC with Differential     Status: Abnormal   Collection Time: 10/14/16  8:57 PM  Result Value Ref Range   WBC 4.8 3.8 - 10.6 K/uL   RBC 4.02 (L) 4.40 - 5.90 MIL/uL   Hemoglobin 11.5 (L) 13.0 - 18.0 g/dL   HCT 34.9 (L) 40.0 - 52.0 %   MCV 86.8 80.0 - 100.0 fL   MCH 28.6 26.0 - 34.0 pg   MCHC 32.9 32.0 - 36.0 g/dL   RDW 18.3 (H) 11.5 - 14.5 %   Platelets 266 150 - 440 K/uL   Neutrophils Relative % 92 %   Neutro Abs 4.4 1.4 - 6.5 K/uL   Lymphocytes Relative 4 %   Lymphs Abs 0.2 (L) 1.0 - 3.6 K/uL   Monocytes Relative 1 %   Monocytes Absolute 0.0 (L) 0.2 - 1.0 K/uL   Eosinophils Relative 1 %   Eosinophils Absolute 0.1 0 - 0.7 K/uL   Basophils Relative 2 %   Basophils Absolute 0.1 0 - 0.1 K/uL  Urine culture     Status: Abnormal   Collection Time: 10/14/16  9:49 PM  Result Value Ref Range   Specimen Description URINE, CLEAN CATCH    Special Requests NONE    Culture MULTIPLE SPECIES PRESENT, SUGGEST RECOLLECTION (A)    Report Status 10/16/2016 FINAL   Urinalysis, Complete w Microscopic     Status: Abnormal   Collection Time: 10/14/16  9:49 PM  Result Value Ref Range   Color, Urine YELLOW (A) YELLOW   APPearance HAZY (A) CLEAR   Specific Gravity, Urine 1.012 1.005 - 1.030   pH 6.0 5.0 - 8.0   Glucose, UA NEGATIVE NEGATIVE mg/dL   Hgb urine dipstick LARGE (A) NEGATIVE   Bilirubin Urine NEGATIVE NEGATIVE  Ketones, ur NEGATIVE NEGATIVE mg/dL   Protein, ur 30 (A) NEGATIVE mg/dL   Nitrite NEGATIVE NEGATIVE   Leukocytes, UA LARGE (A) NEGATIVE   RBC / HPF TOO NUMEROUS TO COUNT 0 - 5 RBC/hpf   WBC, UA TOO NUMEROUS TO COUNT 0 - 5 WBC/hpf   Bacteria, UA RARE (A) NONE SEEN   Squamous Epithelial / LPF NONE SEEN NONE SEEN   Mucous PRESENT   Culture, blood (routine x 2)     Status: None (Preliminary result)   Collection Time: 10/14/16  9:49 PM  Result Value Ref Range   Specimen Description BLOOD  RIGHT HAND    Special Requests      BOTTLES  DRAWN AEROBIC AND ANAEROBIC  ANA 3ML AER 4ML   Culture  Setup Time      GRAM POSITIVE COCCI IN BOTH AEROBIC AND ANAEROBIC BOTTLES CRITICAL RESULT CALLED TO, READ BACK BY AND VERIFIED WITH: Georgina Peer [facility] AT 1904 10/15/2016 BY TFK.    Culture GRAM POSITIVE COCCI    Report Status PENDING   Culture, blood (routine x 2)     Status: None (Preliminary result)   Collection Time: 10/14/16  9:49 PM  Result Value Ref Range   Specimen Description BLOOD  RIGHT AC    Special Requests      BOTTLES DRAWN AEROBIC AND ANAEROBIC  ANA 9ML AER 6ML   Culture  Setup Time      Organism ID to follow GRAM POSITIVE COCCI IN BOTH AEROBIC AND ANAEROBIC BOTTLES CRITICAL RESULT CALLED TO, READ BACK BY AND VERIFIED WITH: Georgina Peer [facility] AT 1904 10/15/2016 BY TFK.    Culture GRAM POSITIVE COCCI    Report Status PENDING   Rapid Influenza A&B Antigens ([facility] only)     Status: None   Collection Time: 10/14/16  9:49 PM  Result Value Ref Range   Influenza A ([facility]) NEGATIVE NEGATIVE   Influenza B ([facility]) NEGATIVE NEGATIVE  Blood Culture ID Panel (Reflexed)     Status: Abnormal   Collection Time: 10/14/16  9:49 PM  Result Value Ref Range   Enterococcus species DETECTED (A) NOT DETECTED    Comment: CRITICAL RESULT CALLED TO, READ BACK BY AND VERIFIED WITH: Georgina Peer [facility] AT 1904 10/15/2016 BY TFK.    Vancomycin resistance NOT DETECTED NOT DETECTED   Listeria monocytogenes NOT DETECTED NOT DETECTED   Staphylococcus species NOT DETECTED NOT DETECTED   Staphylococcus aureus NOT DETECTED NOT DETECTED   Streptococcus species NOT DETECTED NOT DETECTED   Streptococcus agalactiae NOT DETECTED NOT DETECTED   Streptococcus pneumoniae NOT DETECTED NOT DETECTED   Streptococcus pyogenes NOT DETECTED NOT DETECTED   Acinetobacter baumannii NOT DETECTED NOT DETECTED   Enterobacteriaceae species NOT DETECTED NOT DETECTED   Enterobacter cloacae complex NOT DETECTED NOT DETECTED   Escherichia coli NOT DETECTED NOT DETECTED    Klebsiella oxytoca NOT DETECTED NOT DETECTED   Klebsiella pneumoniae NOT DETECTED NOT DETECTED   Proteus species NOT DETECTED NOT DETECTED   Serratia marcescens NOT DETECTED NOT DETECTED   Haemophilus influenzae NOT DETECTED NOT DETECTED   Neisseria meningitidis NOT DETECTED NOT DETECTED   Pseudomonas aeruginosa NOT DETECTED NOT DETECTED   Candida albicans NOT DETECTED NOT DETECTED   Candida glabrata NOT DETECTED NOT DETECTED   Candida krusei NOT DETECTED NOT DETECTED   Candida parapsilosis NOT DETECTED NOT DETECTED   Candida tropicalis NOT DETECTED NOT DETECTED  Lactic acid, plasma     Status: None   Collection Time: 10/15/16 12:38 AM  Result Value Ref  Range   Lactic Acid, Venous 1.1 0.5 - 1.9 mmol/L  Hepatic function panel     Status: Abnormal   Collection Time: 10/15/16 12:38 AM  Result Value Ref Range   Total Protein 4.8 (L) 6.5 - 8.1 g/dL   Albumin 2.4 (L) 3.5 - 5.0 g/dL   AST 16 15 - 41 U/L   ALT 13 (L) 17 - 63 U/L   Alkaline Phosphatase 45 38 - 126 U/L   Total Bilirubin 0.8 0.3 - 1.2 mg/dL   Bilirubin, Direct <0.1 (L) 0.1 - 0.5 mg/dL   Indirect Bilirubin NOT CALCULATED 0.3 - 0.9 mg/dL  MRSA PCR Screening     Status: None   Collection Time: 10/15/16  2:19 AM  Result Value Ref Range   MRSA by PCR NEGATIVE NEGATIVE    Comment:        The GeneXpert MRSA Assay (FDA approved for NASAL specimens only), is one component of a comprehensive MRSA colonization surveillance program. It is not intended to diagnose MRSA infection nor to guide or monitor treatment for MRSA infections.   Glucose, capillary     Status: Abnormal   Collection Time: 10/15/16  2:23 AM  Result Value Ref Range   Glucose-Capillary 109 (H) 65 - 99 mg/dL  Lactic acid, plasma     Status: None   Collection Time: 10/15/16  5:01 AM  Result Value Ref Range   Lactic Acid, Venous 1.4 0.5 - 1.9 mmol/L  CBC     Status: Abnormal   Collection Time: 10/15/16  5:01 AM  Result Value Ref Range   WBC 14.0 (H) 3.8 -  10.6 K/uL   RBC 3.63 (L) 4.40 - 5.90 MIL/uL   Hemoglobin 10.4 (L) 13.0 - 18.0 g/dL   HCT 32.0 (L) 40.0 - 52.0 %   MCV 88.1 80.0 - 100.0 fL   MCH 28.5 26.0 - 34.0 pg   MCHC 32.4 32.0 - 36.0 g/dL   RDW 18.2 (H) 11.5 - 14.5 %   Platelets 198 150 - 440 K/uL  Basic metabolic panel     Status: Abnormal   Collection Time: 10/15/16  5:01 AM  Result Value Ref Range   Sodium 137 135 - 145 mmol/L   Potassium 3.7 3.5 - 5.1 mmol/L   Chloride 111 101 - 111 mmol/L   CO2 20 (L) 22 - 32 mmol/L   Glucose, Bld 123 (H) 65 - 99 mg/dL   BUN 26 (H) 6 - 20 mg/dL   Creatinine, Ser 1.10 0.61 - 1.24 mg/dL   Calcium 8.6 (L) 8.9 - 10.3 mg/dL   GFR calc non Af Amer 59 (L) >60 mL/min   GFR calc Af Amer >60 >60 mL/min    Comment: (NOTE) The eGFR has been calculated using the CKD EPI equation. This calculation has not been validated in all clinical situations. eGFR's persistently <60 mL/min signify possible Chronic Kidney Disease.    Anion gap 6 5 - 15  Magnesium     Status: None   Collection Time: 10/15/16  5:01 AM  Result Value Ref Range   Magnesium 1.8 1.7 - 2.4 mg/dL  Phosphorus     Status: Abnormal   Collection Time: 10/15/16  5:01 AM  Result Value Ref Range   Phosphorus 2.2 (L) 2.5 - 4.6 mg/dL  APTT     Status: Abnormal   Collection Time: 10/15/16  5:01 AM  Result Value Ref Range   aPTT 88 (H) 24 - 36 seconds    Comment:  IF BASELINE aPTT IS ELEVATED, SUGGEST PATIENT RISK ASSESSMENT BE USED TO DETERMINE APPROPRIATE ANTICOAGULANT THERAPY.   Protime-INR     Status: Abnormal   Collection Time: 10/15/16  5:01 AM  Result Value Ref Range   Prothrombin Time 15.4 (H) 11.4 - 15.2 seconds   INR 1.21   Heparin level (unfractionated)     Status: Abnormal   Collection Time: 10/15/16 11:01 AM  Result Value Ref Range   Heparin Unfractionated 0.88 (H) 0.30 - 0.70 IU/mL    Comment:        IF HEPARIN RESULTS ARE BELOW EXPECTED VALUES, AND PATIENT DOSAGE HAS BEEN CONFIRMED, SUGGEST FOLLOW UP  TESTING OF ANTITHROMBIN III LEVELS.   Heparin level (unfractionated)     Status: Abnormal   Collection Time: 10/15/16 10:22 PM  Result Value Ref Range   Heparin Unfractionated 0.74 (H) 0.30 - 0.70 IU/mL    Comment:        IF HEPARIN RESULTS ARE BELOW EXPECTED VALUES, AND PATIENT DOSAGE HAS BEEN CONFIRMED, SUGGEST FOLLOW UP TESTING OF ANTITHROMBIN III LEVELS.   CBC     Status: Abnormal   Collection Time: 10/16/16  8:58 AM  Result Value Ref Range   WBC 9.3 3.8 - 10.6 K/uL   RBC 3.42 (L) 4.40 - 5.90 MIL/uL   Hemoglobin 9.6 (L) 13.0 - 18.0 g/dL   HCT 29.3 (L) 40.0 - 52.0 %   MCV 85.8 80.0 - 100.0 fL   MCH 28.0 26.0 - 34.0 pg   MCHC 32.7 32.0 - 36.0 g/dL   RDW 18.2 (H) 11.5 - 14.5 %   Platelets 174 150 - 440 K/uL  Creatinine, serum     Status: None   Collection Time: 10/16/16  8:58 AM  Result Value Ref Range   Creatinine, Ser 0.99 0.61 - 1.24 mg/dL   GFR calc non Af Amer >60 >60 mL/min   GFR calc Af Amer >60 >60 mL/min    Comment: (NOTE) The eGFR has been calculated using the CKD EPI equation. This calculation has not been validated in all clinical situations. eGFR's persistently <60 mL/min signify possible Chronic Kidney Disease.   Heparin level (unfractionated)     Status: None   Collection Time: 10/16/16  8:58 AM  Result Value Ref Range   Heparin Unfractionated 0.51 0.30 - 0.70 IU/mL    Comment:        IF HEPARIN RESULTS ARE BELOW EXPECTED VALUES, AND PATIENT DOSAGE HAS BEEN CONFIRMED, SUGGEST FOLLOW UP TESTING OF ANTITHROMBIN III LEVELS.    No components found for: ESR, C REACTIVE PROTEIN MICRO: Recent Results (from the past 720 hour(s))  Urine culture     Status: Abnormal   Collection Time: 10/14/16  9:49 PM  Result Value Ref Range Status   Specimen Description URINE, CLEAN CATCH  Final   Special Requests NONE  Final   Culture MULTIPLE SPECIES PRESENT, SUGGEST RECOLLECTION (A)  Final   Report Status 10/16/2016 FINAL  Final  Culture, blood (routine x 2)      Status: None (Preliminary result)   Collection Time: 10/14/16  9:49 PM  Result Value Ref Range Status   Specimen Description BLOOD  RIGHT HAND  Final   Special Requests   Final    BOTTLES DRAWN AEROBIC AND ANAEROBIC  ANA 3ML AER 4ML   Culture  Setup Time   Final    GRAM POSITIVE COCCI IN BOTH AEROBIC AND ANAEROBIC BOTTLES CRITICAL RESULT CALLED TO, READ BACK BY AND VERIFIED WITH: Joan Flores AT  1904 10/15/2016 BY TFK.    Culture GRAM POSITIVE COCCI  Final   Report Status PENDING  Incomplete  Culture, blood (routine x 2)     Status: None (Preliminary result)   Collection Time: 10/14/16  9:49 PM  Result Value Ref Range Status   Specimen Description BLOOD  RIGHT AC  Final   Special Requests   Final    BOTTLES DRAWN AEROBIC AND ANAEROBIC  ANA 9ML AER 6ML   Culture  Setup Time   Final    Organism ID to follow GRAM POSITIVE COCCI IN BOTH AEROBIC AND ANAEROBIC BOTTLES CRITICAL RESULT CALLED TO, READ BACK BY AND VERIFIED WITH: Georgina Peer [facility] AT 1904 10/15/2016 BY TFK.    Culture GRAM POSITIVE COCCI  Final   Report Status PENDING  Incomplete  Rapid Influenza A&B Antigens ([facility] only)     Status: None   Collection Time: 10/14/16  9:49 PM  Result Value Ref Range Status   Influenza A ([facility]) NEGATIVE NEGATIVE Final   Influenza B ([facility]) NEGATIVE NEGATIVE Final  Blood Culture ID Panel (Reflexed)     Status: Abnormal   Collection Time: 10/14/16  9:49 PM  Result Value Ref Range Status   Enterococcus species DETECTED (A) NOT DETECTED Final    Comment: CRITICAL RESULT CALLED TO, READ BACK BY AND VERIFIED WITH: Georgina Peer [facility] AT 1904 10/15/2016 BY TFK.    Vancomycin resistance NOT DETECTED NOT DETECTED Final   Listeria monocytogenes NOT DETECTED NOT DETECTED Final   Staphylococcus species NOT DETECTED NOT DETECTED Final   Staphylococcus aureus NOT DETECTED NOT DETECTED Final   Streptococcus species NOT DETECTED NOT DETECTED Final   Streptococcus agalactiae NOT DETECTED NOT DETECTED  Final   Streptococcus pneumoniae NOT DETECTED NOT DETECTED Final   Streptococcus pyogenes NOT DETECTED NOT DETECTED Final   Acinetobacter baumannii NOT DETECTED NOT DETECTED Final   Enterobacteriaceae species NOT DETECTED NOT DETECTED Final   Enterobacter cloacae complex NOT DETECTED NOT DETECTED Final   Escherichia coli NOT DETECTED NOT DETECTED Final   Klebsiella oxytoca NOT DETECTED NOT DETECTED Final   Klebsiella pneumoniae NOT DETECTED NOT DETECTED Final   Proteus species NOT DETECTED NOT DETECTED Final   Serratia marcescens NOT DETECTED NOT DETECTED Final   Haemophilus influenzae NOT DETECTED NOT DETECTED Final   Neisseria meningitidis NOT DETECTED NOT DETECTED Final   Pseudomonas aeruginosa NOT DETECTED NOT DETECTED Final   Candida albicans NOT DETECTED NOT DETECTED Final   Candida glabrata NOT DETECTED NOT DETECTED Final   Candida krusei NOT DETECTED NOT DETECTED Final   Candida parapsilosis NOT DETECTED NOT DETECTED Final   Candida tropicalis NOT DETECTED NOT DETECTED Final  MRSA PCR Screening     Status: None   Collection Time: 10/15/16  2:19 AM  Result Value Ref Range Status   MRSA by PCR NEGATIVE NEGATIVE Final    Comment:        The GeneXpert MRSA Assay (FDA approved for NASAL specimens only), is one component of a comprehensive MRSA colonization surveillance program. It is not intended to diagnose MRSA infection nor to guide or monitor treatment for MRSA infections.     IMAGING: Dg Chest 2 View  Result Date: 10/14/2016 CLINICAL DATA:  Body aches and tremors EXAM: CHEST  2 VIEW COMPARISON:  06/05/2016 FINDINGS: Bilateral shoulder replacements. Left-sided dual lead pacing device, similar in appearance. No acute infiltrate or effusion. Stable mild cardiomegaly without overt failure. Atherosclerosis. No pneumothorax. Multiple old right-sided rib fractures IMPRESSION: 1. Cardiomegaly without overt failure Electronically  Signed   By: Donavan Foil M.D.   On: 10/14/2016  22:01   Ct Angio Chest Pe W And/or Wo Contrast  Result Date: 10/15/2016 CLINICAL DATA:  History of asthma and COPD EXAM: CT ANGIOGRAPHY CHEST WITH CONTRAST TECHNIQUE: Multidetector CT imaging of the chest was performed using the standard protocol during bolus administration of intravenous contrast. Multiplanar CT image reconstructions and MIPs were obtained to evaluate the vascular anatomy. CONTRAST:  75 mL Isovue 370 intravenous COMPARISON:  Chest x-ray 10/14/2016, CT chest 01/04/2012 FINDINGS: Cardiovascular: Small filling defect within a subsegmental branch of left lower lobe pulmonary artery, series 5, image number 128 and series 9 image number 98. There are no other filling defects identified. Atherosclerotic calcifications within the aorta. No dissection. Dilation of ascending aorta up to 4.2 cm. Coronary artery calcifications. Partially visualized pacer leads. Borderline cardiomegaly. No large pericardial effusion. Mediastinum/Nodes: Trachea is midline. No significantly enlarged mediastinal or hilar nodes. No large pericardial effusion. 1.7 cm nodule in the left lobe of the thyroid gland. Moderate hiatal hernia. Lungs/Pleura: No pneumothorax. No acute infiltrate or pleural effusion. Upper Abdomen: No acute abnormality. Musculoskeletal: Old right-sided rib fractures. No acute or suspicious bone lesions. Review of the MIP images confirms the above findings. IMPRESSION: 1. Suspect nonocclusive subsegmental left lower lobe embolus. 2. Dilation of the ascending aorta up to 4.2 cm. Recommend annual imaging followup by CTA or MRA. This recommendation follows 2010 ACCF/AHA/AATS/ACR/ASA/SCA/SCAI/SIR/STS/SVM Guidelines for the Diagnosis and Management of Patients with Thoracic Aortic Disease. Circulation. 2010; 121: [id number] 3. 1.7 cm left thyroid gland nodule Critical Value/emergent results were called by telephone at the time of interpretation on 10/15/2016 at 2:14 am to Dr. Owens Shark , who verbally acknowledged these  results. Electronically Signed   By: Donavan Foil M.D.   On: 10/15/2016 02:14   US Venous Img Lower Bilateral  Result Date: 10/15/2016 CLINICAL DATA:  Question small left lower lobe pulmonary embolus EXAM: BILATERAL LOWER EXTREMITY VENOUS DOPPLER ULTRASOUND TECHNIQUE: Gray-scale sonography with graded compression, as well as color Doppler and duplex ultrasound were performed to evaluate the lower extremity deep venous systems from the level of the common femoral vein and including the common femoral, femoral, profunda femoral, popliteal and calf veins including the posterior tibial, peroneal and gastrocnemius veins when visible. The superficial great saphenous vein was also interrogated. Spectral Doppler was utilized to evaluate flow at rest and with distal augmentation maneuvers in the common femoral, femoral and popliteal veins. COMPARISON:  None. FINDINGS: RIGHT LOWER EXTREMITY Common Femoral Vein: No evidence of thrombus. Normal compressibility, respiratory phasicity and response to augmentation. Saphenofemoral Junction: No evidence of thrombus. Normal compressibility and flow on color Doppler imaging. Profunda Femoral Vein: No evidence of thrombus. Normal compressibility and flow on color Doppler imaging. Femoral Vein: Right distal femoral vein demonstrates hypoechoic intraluminal thrombus appearing nonocclusive. Vessel is partially compressible. Popliteal Vein: Popliteal vein also demonstrates hypoechoic intraluminal thrombus. This also is nonocclusive. Vessel is partially compressible. Calf Veins: Thrombus does appear to extend into the right posterior tibial veins appearing occlusive and noncompressible. Limited assessment of the peroneal veins. Superficial Great Saphenous Vein: No evidence of thrombus. Normal compressibility and flow on color Doppler imaging. Venous Reflux:  None. Other Findings:  None. LEFT LOWER EXTREMITY Common Femoral Vein: No evidence of thrombus. Normal compressibility,  respiratory phasicity and response to augmentation. Saphenofemoral Junction: No evidence of thrombus. Normal compressibility and flow on color Doppler imaging. Profunda Femoral Vein: No evidence of thrombus. Normal compressibility and flow on color Doppler imaging.  Femoral Vein: No evidence of thrombus. Normal compressibility, respiratory phasicity and response to augmentation. Popliteal Vein: No evidence of thrombus. Normal compressibility, respiratory phasicity and response to augmentation. Calf Veins: No evidence of thrombus. Normal compressibility and flow on color Doppler imaging. Superficial Great Saphenous Vein: No evidence of thrombus. Normal compressibility and flow on color Doppler imaging. Venous Reflux:  None. Other Findings:  None. IMPRESSION: Positive exam for nonocclusive right distal femoral popliteal DVT extending into the calf posterior tibial vein. Negative for left lower extremity DVT. Electronically Signed   By: Jerilynn Mages.  Shick M.D.   On: 10/15/2016 10:04    Assessment:   JEMAINE PROKOP is a 83 y.o. male With enterococcal bacteremia following urological procedure with a likely urinary source.  He does however have a permanent pacemaker and a watchman device in place.  There is some concern on my part that these could be infected. Bcx fu 1/4 NGTD and TTE neg for Veg  Recommendations Continue vanco Can dc meropenem Check TEE - if + for vegetation will need IV ceftriaxone plus vanco (or desensitization to PCN) Thank you very much for allowing me to participate in the care of this patient. Please call with questions.   Douglas Washington. Douglas Spurr, MD

## 2016-10-16 NOTE — Progress Notes (Signed)
ANTICOAGULATION CONSULT NOTE - Initial Consult  Pharmacy Consult for heparin Indication: pulmonary embolus   87 yom cc SOB s/p ureteroscopy with stent placement and subsequent stent removal. Febrile and desating per EMS. Pharmacy consulted to dose UFH for PE. Patient currently receiving heparin 900 units/hr.   Goal of Therapy:  Heparin level 0.3-0.7 units/ml Monitor platelets by anticoagulation protocol: Yes   Plan:  Anti-Xa is in therapeutic range. Will obtain confirmatory level at 1700.   Allergies  Allergen Reactions  . Penicillins Anaphylaxis and Swelling    Has patient had a PCN reaction causing immediate rash, facial/tongue/throat swelling, SOB or lightheadedness with hypotension: Yes Has patient had a PCN reaction causing severe rash involving mucus membranes or skin necrosis: No Has patient had a PCN reaction that required hospitalization No Has patient had a PCN reaction occurring within the last 10 years: No If all of the above answers are "NO", then may proceed with Cephalosporin use.   . Adhesive [Tape] Other (See Comments)    Tears skin  . Ciprofloxacin Other (See Comments)    Unknown  . Oxycodone Other (See Comments)    Hallucinations  . Simvastatin Other (See Comments)    Weakness  . Statins Other (See Comments)    Weakness, couldn't walk  . Sulfa Antibiotics Swelling    Patient Measurements: Height: 5\' 10"  (177.8 cm) Weight: 203 lb 7.8 oz (92.3 kg) IBW/kg (Calculated) : 73 Heparin Dosing Weight: 91.5 kg  Vital Signs: Temp: 98.7 F (37.1 C) (01/04 0800) Temp Source: Oral (01/04 0800) BP: 116/59 (01/04 0900) Pulse Rate: 60 (01/04 0900)  Labs:  Recent Labs  10/14/16 2057 10/15/16 0501 10/15/16 1101 10/15/16 2222 10/16/16 0858  HGB 11.5*  11.4* 10.4*  --   --  9.6*  HCT 34.9*  35.0* 32.0*  --   --  29.3*  PLT 266  252 198  --   --  174  APTT  --  88*  --   --   --   LABPROT  --  15.4*  --   --   --   INR  --  1.21  --   --   --    HEPARINUNFRC  --   --  0.88* 0.74* 0.51  CREATININE 1.05 1.10  --   --  0.99  TROPONINI <0.03  --   --   --   --     Estimated Creatinine Clearance: 62.3 mL/min (by C-G formula based on SCr of 0.99 mg/dL).   Medications:  Infusions:  . heparin 900 Units/hr (10/16/16 0800)   Pharmacy will continue to monitor and adjust per consult.   Tatiyana Foucher L 10/16/2016,11:56 AM

## 2016-10-16 NOTE — Progress Notes (Signed)
Pt being transferred to room 202. Report called to [place], Charity fundraiser. Pt and belongings moved to room 202 without incident.

## 2016-10-16 NOTE — Progress Notes (Addendum)
Brief ID note Enterococcal bacteremia and PE following stent removal. UA with TNTC wbc but ucx mixed.  Pt has a watchman LAA device in place. Last TEE 09/12/16   Likely urinary source. PCN allergic but tolerating meropenem. Unclear of other source of infection as well but CT showed no PNA.  Can cont vanco and meropenem while awaiting sensitivities of the enterococcus Check echo and repeat bcx  Will need TEE to eval Watchmans device and PPM

## 2016-10-16 NOTE — H&P (Addendum)
PULMONARY / CRITICAL CARE MEDICINE   Name: Douglas Washington MRN: [id number] DOB: [birth year]    ADMISSION DATE:  10/14/2016 CONSULTATION DATE:  10/15/16  REFERRING MD:  Dr. Arnaldo Natal  CHIEF COMPLAINT:  Shortness of breath  Patient readmitted after stent removal for septic shock, found to have PE, LE DVT.   --Transfer report discussed with Dr. Nemiah Commander.   ASSESSMENT / PLAN:  PULMONARY A: Pulmonary Embolus - Subsegmental Lower lobe COPD Sleep Apnea -uses CPAP Doppler US 1/3: Positive exam for nonocclusive right distal femoral popliteal DVT extending into the calf posterior tibial vein. Negative for left lower extremity DVT. P:   Support with O2 to keep sats >88% Bronchodilators PRN cpap qhs. Continue Singulair Heparin per pharmacy consult past surgical history that includes Craniotomy (03/2005);    CARDIOVASCULAR A:  Hx of CHF Hypotension secondary to sepsis, possibly PE>  Pacemaker Hx of Afib P:  Coreg when BP tolerates.  Continue Amiodarone PO Continue Plavix S/p ECHO 1/3 results pending.    RENAL A:   Chronic Kidney Disease Hx of Urethral and renal stones -s/p stent removal on 1/2 Hypoalbuminemia P:   Replace electrolytes per Usual guideline Followed By Dr. Evelene Croon Follow Chemistry Continue Meropenem  Will follow cutures   GASTROINTESTINAL A:   No active issues P:   Will keep Npo for now   HEMATOLOGIC A:   No active issues P:  Heparin for DVT prophylaxis Will Transfuse per usual guidelines  INFECTIOUS A:   Sepsis secondary to Urine P:   Monitor fever curve Follow cultures Continue Meropenem Follow CBC Lactic acid-1.1   CULTURES: 10/14/16 BC>>[facility]  10/14/16 UC>>[facility]; PCR=VRE 10/14/16 Rapid influenza A and B >> Negative MRSA PCR 1/3; negative.   ANTIBIOTICS: 10/15/16 Gentamycin X1 dose for the procedure 10/15/16 Meropenem>>   ENDOCRINE A:   No active issues P:   BS intermittently with BMP  NEUROLOGIC A:   No active issues P:    Minimize sedating drugs Reorient as needed.  STUDIES:  09/12/16 ECHO TEE>>The cavity size was normal. Wall thickness was  normal. Systolic function was normal. The estimated ejection  fraction was in the range of 55% to 60%. 10/15/16 CT angio>> Sub segmental lower lobe embolus  SIGNIFICANT EVENTS: 10/15/16>> patient admitted to the ICU possibly due to sepsis secondary to Urine  LINES/TUBES: None    FAMILY  - Updates: Wife was updated regarding the plan of care.   ------------------------------------------------------------------ REVIEW OF SYSTEMS:   Review of Systems  Constitutional: Positive for chills and fever. Negative for diaphoresis and weight loss.  HENT: Negative for congestion, ear discharge, ear pain, sinus pain and tinnitus.   Eyes: Negative for photophobia, pain and discharge.  Respiratory: Positive for shortness of breath. Negative for hemoptysis, sputum production and stridor.   Cardiovascular: Negative for palpitations, orthopnea, claudication and leg swelling.  Gastrointestinal: Negative for abdominal pain, blood in stool, constipation, diarrhea, heartburn, nausea and vomiting.  Genitourinary: Negative for frequency and urgency.  Musculoskeletal: Negative for back pain and joint pain.  Skin: Negative for itching and rash.  Neurological: Negative for tingling, sensory change, speech change, focal weakness, seizures, loss of consciousness and weakness.  Psychiatric/Behavioral: Negative for hallucinations and substance abuse. The patient is not nervous/anxious and does not have insomnia.      SUBJECTIVE:  Patient states that he is still cold but feels better.  Discussed code status with the patient and the patient wants CPR but no intubation in the event of cardio-respiratory arrest.  VITAL SIGNS:  BP 106/70   Pulse 70   Temp 98.6 F (37 C) (Oral)   Resp 19   Ht 5\' 10"  (1.778 m)   Wt 203 lb 7.8 oz (92.3 kg)   SpO2 97%   BMI 29.20 kg/m   HEMODYNAMICS:     VENTILATOR SETTINGS:    INTAKE / OUTPUT: I/O last 3 completed shifts: In: 1277 [I.V.:177; IV Piggyback:1100] Out: 1550 [Urine:1550]  PHYSICAL EXAMINATION: General: Pleasantly appearing , elder gentleman, in no acute distress Neuro: Awake, Alert, oriented, no focal deficits noted HEENT:  AT, [place], no discharge,HOH,PERRLA Cardiovascular:  Paced, no MRG noted Lungs:  Clear bilaterally , no wheezes, crackles, rhonchi noted Abdomen: soft, non tender, active bowel sounds Musculoskeletal:  No inflammation/deformity noted Skin:  Grossly intact  LABS:  BMET  Recent Labs Lab 10/14/16 2057 10/15/16 0501  NA 136 137  K 4.2 3.7  CL 107 111  CO2 24 20*  BUN 21* 26*  CREATININE 1.05 1.10  GLUCOSE 110* 123*    Electrolytes  Recent Labs Lab 10/14/16 2057 10/15/16 0501  CALCIUM 9.0 8.6*  MG  --  1.8  PHOS  --  2.2*    CBC  Recent Labs Lab 10/14/16 2057 10/15/16 0501  WBC 4.8  4.7 14.0*  HGB 11.5*  11.4* 10.4*  HCT 34.9*  35.0* 32.0*  PLT 266  252 198    Coag's  Recent Labs Lab 10/15/16 0501  APTT 88*  INR 1.21    Sepsis Markers  Recent Labs Lab 10/15/16 0038 10/15/16 0501  LATICACIDVEN 1.1 1.4    ABG No results for input(s): PHART, PCO2ART, PO2ART in the last 168 hours.  Liver Enzymes  Recent Labs Lab 10/15/16 0038  AST 16  ALT 13*  ALKPHOS 45  BILITOT 0.8  ALBUMIN 2.4*    Cardiac Enzymes  Recent Labs Lab 10/14/16 2057  TROPONINI <0.03    Glucose  Recent Labs Lab 10/15/16 0223  GLUCAP 109*    Imaging US Venous Img Lower Bilateral  Result Date: 10/15/2016 CLINICAL DATA:  Question small left lower lobe pulmonary embolus EXAM: BILATERAL LOWER EXTREMITY VENOUS DOPPLER ULTRASOUND TECHNIQUE: Gray-scale sonography with graded compression, as well as color Doppler and duplex ultrasound were performed to evaluate the lower extremity deep venous systems from the level of the common femoral vein and including the common femoral,  femoral, profunda femoral, popliteal and calf veins including the posterior tibial, peroneal and gastrocnemius veins when visible. The superficial great saphenous vein was also interrogated. Spectral Doppler was utilized to evaluate flow at rest and with distal augmentation maneuvers in the common femoral, femoral and popliteal veins. COMPARISON:  None. FINDINGS: RIGHT LOWER EXTREMITY Common Femoral Vein: No evidence of thrombus. Normal compressibility, respiratory phasicity and response to augmentation. Saphenofemoral Junction: No evidence of thrombus. Normal compressibility and flow on color Doppler imaging. Profunda Femoral Vein: No evidence of thrombus. Normal compressibility and flow on color Doppler imaging. Femoral Vein: Right distal femoral vein demonstrates hypoechoic intraluminal thrombus appearing nonocclusive. Vessel is partially compressible. Popliteal Vein: Popliteal vein also demonstrates hypoechoic intraluminal thrombus. This also is nonocclusive. Vessel is partially compressible. Calf Veins: Thrombus does appear to extend into the right posterior tibial veins appearing occlusive and noncompressible. Limited assessment of the peroneal veins. Superficial Great Saphenous Vein: No evidence of thrombus. Normal compressibility and flow on color Doppler imaging. Venous Reflux:  None. Other Findings:  None. LEFT LOWER EXTREMITY Common Femoral Vein: No evidence of thrombus. Normal compressibility, respiratory phasicity and response to augmentation. Saphenofemoral Junction:  No evidence of thrombus. Normal compressibility and flow on color Doppler imaging. Profunda Femoral Vein: No evidence of thrombus. Normal compressibility and flow on color Doppler imaging. Femoral Vein: No evidence of thrombus. Normal compressibility, respiratory phasicity and response to augmentation. Popliteal Vein: No evidence of thrombus. Normal compressibility, respiratory phasicity and response to augmentation. Calf Veins: No  evidence of thrombus. Normal compressibility and flow on color Doppler imaging. Superficial Great Saphenous Vein: No evidence of thrombus. Normal compressibility and flow on color Doppler imaging. Venous Reflux:  None. Other Findings:  None. IMPRESSION: Positive exam for nonocclusive right distal femoral popliteal DVT extending into the calf posterior tibial vein. Negative for left lower extremity DVT. Electronically Signed   By: Judie Petit.  Shick M.D.   On: 10/15/2016 10:04      -Wells Guiles, M.D.  10/16/2016  Critical Care Attestation.  I have personally obtained a history, examined the patient, evaluated laboratory and imaging results, formulated the assessment and plan and placed orders. The Patient requires high complexity decision making for assessment and support, frequent evaluation and titration of therapies, application of advanced monitoring technologies and extensive interpretation of multiple databases. The patient has critical illness that could lead imminently to failure of 1 or more organ systems and requires the highest level of physician preparedness to intervene.  Critical Care Time devoted to patient care services described in this note is 45 minutes and is exclusive of time spent in procedures.

## 2016-10-17 ENCOUNTER — Inpatient Hospital Stay
Admit: 2016-10-17 | Discharge: 2016-10-17 | Disposition: A | Discharge status: Home / Self Care | Payer: Medicare Other | Attending: Cardiovascular Disease | Admitting: Cardiovascular Disease

## 2016-10-17 ENCOUNTER — Encounter
Admission: EM | Disposition: A | Discharge status: Home Health | Payer: Self-pay | Source: Home / Self Care | Attending: Internal Medicine

## 2016-10-17 ENCOUNTER — Ambulatory Visit: Payer: Medicare Other

## 2016-10-17 ENCOUNTER — Inpatient Hospital Stay (HOSPITAL_COMMUNITY)
Admit: 2016-10-17 | Discharge: 2016-10-17 | Disposition: A | Discharge status: Home / Self Care | Payer: Medicare Other | Attending: Infectious Diseases | Admitting: Infectious Diseases

## 2016-10-17 DIAGNOSIS — I340 Nonrheumatic mitral (valve) insufficiency: Secondary | ICD-10-CM

## 2016-10-17 DIAGNOSIS — R509 Fever, unspecified: Secondary | ICD-10-CM

## 2016-10-17 LAB — CBC
HCT: 34.3 % — ABNORMAL LOW (ref 40.0–52.0)
HEMOGLOBIN: 11.2 g/dL — AB (ref 13.0–18.0)
MCH: 28.2 pg (ref 26.0–34.0)
MCHC: 32.7 g/dL (ref 32.0–36.0)
MCV: 86.4 fL (ref 80.0–100.0)
Platelets: 203 10*3/uL (ref 150–440)
RBC: 3.97 MIL/uL — ABNORMAL LOW (ref 4.40–5.90)
RDW: 19 % — AB (ref 11.5–14.5)
WBC: 6.1 10*3/uL (ref 3.8–10.6)

## 2016-10-17 LAB — ECHOCARDIOGRAM COMPLETE
HEIGHTINCHES: 70 in
WEIGHTICAEL: 3427.2 [oz_av]

## 2016-10-17 LAB — HEPARIN LEVEL (UNFRACTIONATED)
HEPARIN UNFRACTIONATED: 0.44 [IU]/mL (ref 0.30–0.70)
Heparin Unfractionated: 0.26 IU/mL — ABNORMAL LOW (ref 0.30–0.70)

## 2016-10-17 LAB — VANCOMYCIN, TROUGH: VANCOMYCIN TR: 16 ug/mL (ref 15–20)

## 2016-10-17 SURGERY — ECHOCARDIOGRAM, TRANSESOPHAGEAL
Anesthesia: Moderate Sedation

## 2016-10-17 MED ORDER — MIDAZOLAM HCL 2 MG/2ML IJ SOLN
INTRAMUSCULAR | Status: AC | PRN
  Administered 2016-10-17: 2 mg via INTRAVENOUS

## 2016-10-17 MED ORDER — LIDOCAINE VISCOUS 2 % MT SOLN
OROMUCOSAL | Status: AC
  Filled 2016-10-17: qty 15

## 2016-10-17 MED ORDER — FENTANYL CITRATE (PF) 100 MCG/2ML IJ SOLN
INTRAMUSCULAR | Status: AC
  Filled 2016-10-17: qty 4

## 2016-10-17 MED ORDER — MIDAZOLAM HCL 5 MG/5ML IJ SOLN
INTRAMUSCULAR | Status: AC
  Filled 2016-10-17: qty 5

## 2016-10-17 MED ORDER — HEPARIN BOLUS VIA INFUSION
1300.0000 [IU] | Freq: Once | INTRAVENOUS | Status: AC
  Administered 2016-10-17: 1300 [IU] via INTRAVENOUS
  Filled 2016-10-17: qty 1300

## 2016-10-17 MED ORDER — FENTANYL CITRATE (PF) 100 MCG/2ML IJ SOLN
INTRAMUSCULAR | Status: AC | PRN
  Administered 2016-10-17: 50 ug via INTRAVENOUS

## 2016-10-17 MED ORDER — SODIUM CHLORIDE FLUSH 0.9 % IV SOLN
INTRAVENOUS | Status: AC
  Filled 2016-10-17: qty 10

## 2016-10-17 MED ORDER — BUTAMBEN-TETRACAINE-BENZOCAINE 2-2-14 % EX AERO
INHALATION_SPRAY | CUTANEOUS | Status: AC
  Filled 2016-10-17: qty 20

## 2016-10-17 MED ORDER — POLYETHYLENE GLYCOL 3350 17 G PO PACK
17.0000 g | PACK | Freq: Every day | ORAL | Status: DC
  Administered 2016-10-18 – 2016-10-19 (×2): 17 g via ORAL
  Filled 2016-10-17 (×2): qty 1

## 2016-10-17 MED ORDER — VALACYCLOVIR HCL 500 MG PO TABS
500.0000 mg | ORAL_TABLET | Freq: Every day | ORAL | Status: DC
  Administered 2016-10-18 – 2016-10-19 (×2): 500 mg via ORAL
  Filled 2016-10-17 (×3): qty 1

## 2016-10-17 NOTE — Interval H&P Note (Signed)
History and Physical Interval Note:  10/17/2016 11:02 AM  Douglas Washington  has presented today for surgery, with the diagnosis of AFIB  The various methods of treatment have been discussed with the patient and family. After consideration of risks, benefits and other options for treatment, the patient has consented to  Procedure(s): TRANSESOPHAGEAL ECHOCARDIOGRAM (TEE) (N/A) as a surgical intervention .  The patient's history has been reviewed, patient examined, no change in status, stable for surgery.  I have reviewed the patient's chart and labs.  Questions were answered to the patient's satisfaction.     Charlton Haws

## 2016-10-17 NOTE — Progress Notes (Signed)
Pharmacy Antibiotic Note  Douglas Washington is a 83 y.o. male admitted on 10/14/2016 with sepsis.  Pharmacy has been consulted for vancomycin and meropenem dosing dosing. Patient to be seen by ID.   Plan: Vancomycin 1g IV Q12hr for goal trough of 15-20. Will obtain trough prior to afternoon dose on 1/5.   Meropenem 1g IV Q8hr; per primary team will continue meropenem for possible alternate infection and source.   1/05:  VT @ 15:30 = 16 mcg/mL  Will continue this pt on current dose of 1 gm IV Q12H.   Height: 5\' 10"  (177.8 cm) Weight: 214 lb 3.2 oz (97.2 kg) IBW/kg (Calculated) : 73  Temp (24hrs), Avg:98 F (36.7 C), Min:97.7 F (36.5 C), Max:98.3 F (36.8 C)   Recent Labs Lab 10/14/16 2057 10/15/16 0038 10/15/16 0501 10/16/16 0858 10/17/16 0805 10/17/16 1538  WBC 4.8  4.7  --  14.0* 9.3 6.1  --   CREATININE 1.05  --  1.10 0.99  --   --   LATICACIDVEN  --  1.1 1.4  --   --   --   VANCOTROUGH  --   --   --   --   --  16    Estimated Creatinine Clearance: 63.8 mL/min (by C-G formula based on SCr of 0.99 mg/dL).    Allergies  Allergen Reactions  . Penicillins Anaphylaxis and Swelling    Has patient had a PCN reaction causing immediate rash, facial/tongue/throat swelling, SOB or lightheadedness with hypotension: Yes Has patient had a PCN reaction causing severe rash involving mucus membranes or skin necrosis: No Has patient had a PCN reaction that required hospitalization No Has patient had a PCN reaction occurring within the last 10 years: No If all of the above answers are "NO", then may proceed with Cephalosporin use.   . Adhesive [Tape] Other (See Comments)    Tears skin  . Ciprofloxacin Other (See Comments)    Unknown  . Oxycodone Other (See Comments)    Hallucinations  . Simvastatin Other (See Comments)    Weakness  . Statins Other (See Comments)    Weakness, couldn't walk  . Sulfa Antibiotics Swelling    Antimicrobials this admission: Meropenem 1/3 >> Gentamycin  1/3>> 1/3 Vancomycin 1/3 >>  Microbiology results: 1/2 BCx: enterococcus species, Van A/B not detected 1/2 UCx: multiple species present  1/2 MRSA PCR: negative 1/2 Influenza: negative  Pharmacy will continue to monitor and adjust per consult.   Estel Scholze D 10/17/2016 4:29 PM

## 2016-10-17 NOTE — Progress Notes (Signed)
*  PRELIMINARY RESULTS* Echocardiogram Echocardiogram Transesophageal has been performed.  Douglas Washington 10/17/2016, 4:54 PM

## 2016-10-17 NOTE — Progress Notes (Signed)
ANTICOAGULATION CONSULT NOTE - Initial Consult  Pharmacy Consult for heparin Indication: pulmonary embolus   87 yom cc SOB s/p ureteroscopy with stent placement and subsequent stent removal. Febrile and desating per EMS. Pharmacy consulted to dose UFH for PE. Patient currently receiving heparin 900 units/hr.   Goal of Therapy:  Heparin level 0.3-0.7 units/ml Monitor platelets by anticoagulation protocol: Yes   Plan:  Heparin level subtherapeutic, will give bolus of 1300 units x 1 and increase rate to 1050 units/hr. Recheck heparin level in 8 hours.    Allergies  Allergen Reactions  . Penicillins Anaphylaxis and Swelling    Has patient had a PCN reaction causing immediate rash, facial/tongue/throat swelling, SOB or lightheadedness with hypotension: Yes Has patient had a PCN reaction causing severe rash involving mucus membranes or skin necrosis: No Has patient had a PCN reaction that required hospitalization No Has patient had a PCN reaction occurring within the last 10 years: No If all of the above answers are "NO", then may proceed with Cephalosporin use.   . Adhesive [Tape] Other (See Comments)    Tears skin  . Ciprofloxacin Other (See Comments)    Unknown  . Oxycodone Other (See Comments)    Hallucinations  . Simvastatin Other (See Comments)    Weakness  . Statins Other (See Comments)    Weakness, couldn't walk  . Sulfa Antibiotics Swelling    Patient Measurements: Height: 5\' 10"  (177.8 cm) Weight: 214 lb 3.2 oz (97.2 kg) IBW/kg (Calculated) : 73 Heparin Dosing Weight: 91.5 kg  Vital Signs: Temp: 97.7 F (36.5 C) (01/05 0823) Temp Source: Oral (01/05 0823) BP: 131/64 (01/05 0823) Pulse Rate: 61 (01/05 0823)  Labs:  Recent Labs  10/14/16 2057 10/15/16 0501  10/16/16 0858 10/16/16 1657 10/17/16 0805  HGB 11.5*  11.4* 10.4*  --  9.6*  --  11.2*  HCT 34.9*  35.0* 32.0*  --  29.3*  --  34.3*  PLT 266  252 198  --  174  --  203  APTT  --  88*  --   --    --   --   LABPROT  --  15.4*  --   --   --   --   INR  --  1.21  --   --   --   --   HEPARINUNFRC  --   --   < > 0.51 0.41 0.26*  CREATININE 1.05 1.10  --  0.99  --   --   TROPONINI <0.03  --   --   --   --   --   < > = values in this interval not displayed.  Estimated Creatinine Clearance: 63.8 mL/min (by C-G formula based on SCr of 0.99 mg/dL).   Medications:  Infusions:  . heparin 900 Units/hr (10/17/16 0017)   Pharmacy will continue to monitor and adjust per consult.   Deairra Halleck C 10/17/2016,10:07 AM

## 2016-10-17 NOTE — Care Management (Signed)
Transferred from CCU. Patient admitted for septic shock, found to have PE, LE DVT post  stent removal.  Patient was on Eliquis, and was on hold for stent removal.  Patient lives at home with wife.  Prior to procedure reported independent. PCP [facility]. PT consult pending.

## 2016-10-17 NOTE — Progress Notes (Addendum)
[facility] - [place] at [facility]   PATIENT NAME: Douglas Washington    MR#:  [id number]  DATE OF BIRTH:  Jul 09, 1929  SUBJECTIVE:  Came in with fever ans sepsis Doing bewtter On heparin gtt  REVIEW OF SYSTEMS:   Review of Systems  Constitutional: Negative for chills, fever and weight loss.  HENT: Negative for ear discharge, ear pain and nosebleeds.   Eyes: Negative for blurred vision, pain and discharge.  Respiratory: Negative for sputum production, shortness of breath, wheezing and stridor.   Cardiovascular: Negative for chest pain, palpitations, orthopnea and PND.  Gastrointestinal: Negative for abdominal pain, diarrhea, nausea and vomiting.  Genitourinary: Negative for frequency and urgency.  Musculoskeletal: Negative for back pain and joint pain.  Neurological: Negative for sensory change, speech change, focal weakness and weakness.  Psychiatric/Behavioral: Negative for depression and hallucinations. The patient is not nervous/anxious.    Tolerating Diet:yes Tolerating PT: HHPT  DRUG ALLERGIES:   Allergies  Allergen Reactions  . Penicillins Anaphylaxis and Swelling    Has patient had a PCN reaction causing immediate rash, facial/tongue/throat swelling, SOB or lightheadedness with hypotension: Yes Has patient had a PCN reaction causing severe rash involving mucus membranes or skin necrosis: No Has patient had a PCN reaction that required hospitalization No Has patient had a PCN reaction occurring within the last 10 years: No If all of the above answers are "NO", then may proceed with Cephalosporin use.   . Adhesive [Tape] Other (See Comments)    Tears skin  . Ciprofloxacin Other (See Comments)    Unknown  . Oxycodone Other (See Comments)    Hallucinations  . Simvastatin Other (See Comments)    Weakness  . Statins Other (See Comments)    Weakness, couldn't walk  . Sulfa Antibiotics Swelling    VITALS:  Blood pressure 108/60, pulse (!) 59,  temperature 98 F (36.7 C), temperature source Oral, resp. rate 20, height 5\' 10"  (1.778 m), weight 97.2 kg (214 lb 3.2 oz), SpO2 97 %.  PHYSICAL EXAMINATION:   Physical Exam  GENERAL:  83 y.o.-year-old patient lying in the bed with no acute distress.  EYES: Pupils equal, round, reactive to light and accommodation. No scleral icterus. Extraocular muscles intact.  HEENT: Head atraumatic, normocephalic. Oropharynx and nasopharynx clear.  NECK:  Supple, no jugular venous distention. No thyroid enlargement, no tenderness.  LUNGS: Normal breath sounds bilaterally, no wheezing, rales, rhonchi. No use of accessory muscles of respiration.  CARDIOVASCULAR: S1, S2 normal. No murmurs, rubs, or gallops.  ABDOMEN: Soft, nontender, nondistended. Bowel sounds present. No organomegaly or mass.  EXTREMITIES: No cyanosis, clubbing or edema b/l.    NEUROLOGIC: Cranial nerves II through XII are intact. No focal Motor or sensory deficits b/l.   PSYCHIATRIC:  patient is alert and oriented x 3.  SKIN: No obvious rash, lesion, or ulcer.   LABORATORY PANEL:  CBC  Recent Labs Lab 10/17/16 0805  WBC 6.1  HGB 11.2*  HCT 34.3*  PLT 203    Chemistries   Recent Labs Lab 10/15/16 0038 10/15/16 0501 10/16/16 0858  NA  --  137  --   K  --  3.7  --   CL  --  111  --   CO2  --  20*  --   GLUCOSE  --  123*  --   BUN  --  26*  --   CREATININE  --  1.10 0.99  CALCIUM  --  8.6*  --   MG  --  1.8  --   AST 16  --   --   ALT 13*  --   --   ALKPHOS 45  --   --   BILITOT 0.8  --   --    Cardiac Enzymes  Recent Labs Lab 10/14/16 2057  TROPONINI <0.03   RADIOLOGY:  No results found. ASSESSMENT AND PLAN:   50 yy.o male with multiple medical prblems including afib on eliquis (was on hold due to urology procedure) comes in with   1. Septic shock-Enterococcus ppted after ureteral stent removal couple days ago -repeat BC neg -Pt on Vanc and meropenem -will need IV abxs at home---PICC line placement  tomorrow -TEE by dr Mariah Milling neg for vegetation -afebrile -improving  2. Left LL Pulmonary Embolus - Subsegmental Lower lobe and right LE DVT ( -pt's eliquis was on hold due to urology procedure -currently on heparin gtt -will change to po eliquis tomorrow -pt has h/o subdural hematoma but with PE and DVT will need blodd thinners -past surgical history that includes Craniotomy (03/2005);   3. Chronic afib with recent Watchman filter placement in oct 2017 -TEE negtive today -cont Coreg  -Continue Amiodarone PO  4.Chronic Kidney Disease Hx of Urethral and renal stones -s/p stent removal on 10/15/15 by Dr Evelene Croon Hypoalbuminemia  -Replace electrolytes per Usual guideline Followed By Dr. Evelene Croon Continue Meropenem   5. OSA cont CPAP  6. PT to see pt  7. CSW for d/c planning  Case discussed with Care Management/Social Worker. Management plans discussed with the patient, family and they are in agreement.  CODE STATUS: limited DVT Prophylaxis: on heparin gtt  TOTAL TIME TAKING CARE OF THIS PATIENT: 30 minutes.  >50% time spent on counselling and coordination of care  POSSIBLE D/C IN 2-3DAYS, DEPENDING ON CLINICAL CONDITION.  Note: This dictation was prepared with Dragon dictation along with smaller phrase technology. Any transcriptional errors that result from this process are unintentional.  Aviva Wolfer M.D on 10/17/2016 at 7:39 PM  Between 7am to 6pm - Pager - [phone]  After 6pm go to [email or web address] - password [email or web address] [facility]  [facility]  Office  [phone]  CC: Primary care physician; BABAOFF, Lavada Mesi, MD

## 2016-10-17 NOTE — Progress Notes (Signed)
Spoke with Dr. Allena Katz about if SCDs needed to be placed as pt has order but they have not been placed. Pt has home TED stockings on and on heparin drip. Per MD okay to go without SCDS as pt is on heparin drip as well.

## 2016-10-17 NOTE — Care Management (Signed)
Patient off the floor.  Notified by infections disease to anticipated patient to discharge home with IV antibiotics

## 2016-10-17 NOTE — Progress Notes (Addendum)
ID FU TEE neg  At this point would suggest a 2 week course of IV vancomycin.  This is a conservative approach to prevent infection of the intravascular devices. Since he is PCN allergic would use vancomycin.  If bcx done 1/4 remains negative tomorrow can place PICC line.    See abx order sheet.  I can see in 2 weeks and will plan to check fu bcx to document clearance.

## 2016-10-17 NOTE — Progress Notes (Signed)
Per request of family member okay spoke with Dr. Allena Katz and okay to restart patient on valcyclovir 500mg  oral which is a home med.

## 2016-10-17 NOTE — Progress Notes (Signed)
Infectious Disease Long Term IV Antibiotic Orders  Diagnosis: Enterococcal bacteremia  Culture results Enterococcus on bcx 1/2   Allergies:  Allergies  Allergen Reactions  . Penicillins Anaphylaxis and Swelling    Has patient had a PCN reaction causing immediate rash, facial/tongue/throat swelling, SOB or lightheadedness with hypotension: Yes Has patient had a PCN reaction causing severe rash involving mucus membranes or skin necrosis: No Has patient had a PCN reaction that required hospitalization No Has patient had a PCN reaction occurring within the last 10 years: No If all of the above answers are "NO", then may proceed with Cephalosporin use.   . Adhesive [Tape] Other (See Comments)    Tears skin  . Ciprofloxacin Other (See Comments)    Unknown  . Oxycodone Other (See Comments)    Hallucinations  . Simvastatin Other (See Comments)    Weakness  . Statins Other (See Comments)    Weakness, couldn't walk  . Sulfa Antibiotics Swelling    Discharge antibiotics Vancomycin       1000           mg  every 12            hours .     Goal vancomycin trough 15-20.    Pharmacy to adjust dosing based on levels   PICC Care per protocol Labs weekly while on IV antibiotics      CBC w diff   Comprehensive met panel Vancomycin Trough    Planned duration of antibiotics 2 weeks from first neg bcx 10/17/15  Stop date 10/31/15  Follow up clinic date 2 weeks   FAX weekly labs to [phone]  Leonel Ramsay, MD

## 2016-10-17 NOTE — Progress Notes (Signed)
ANTICOAGULATION CONSULT NOTE - Initial Consult  Pharmacy Consult for heparin Indication: pulmonary embolus   87 yom cc SOB s/p ureteroscopy with stent placement and subsequent stent removal. Febrile and desating per EMS. Pharmacy consulted to dose UFH for PE. Patient currently receiving heparin 1050 units/hr.   Goal of Therapy:  Heparin level 0.3-0.7 units/ml Monitor platelets by anticoagulation protocol: Yes   Plan:  01/05 @ 1830 anti-Xa level therapeutic at 0.44 x 1. Will re-check in 8 hours to confirm.   Allergies  Allergen Reactions  . Penicillins Anaphylaxis and Swelling    Has patient had a PCN reaction causing immediate rash, facial/tongue/throat swelling, SOB or lightheadedness with hypotension: Yes Has patient had a PCN reaction causing severe rash involving mucus membranes or skin necrosis: No Has patient had a PCN reaction that required hospitalization No Has patient had a PCN reaction occurring within the last 10 years: No If all of the above answers are "NO", then may proceed with Cephalosporin use.   . Adhesive [Tape] Other (See Comments)    Tears skin  . Ciprofloxacin Other (See Comments)    Unknown  . Oxycodone Other (See Comments)    Hallucinations  . Simvastatin Other (See Comments)    Weakness  . Statins Other (See Comments)    Weakness, couldn't walk  . Sulfa Antibiotics Swelling    Patient Measurements: Height: 5\' 10"  (177.8 cm) Weight: 214 lb 3.2 oz (97.2 kg) IBW/kg (Calculated) : 73 Heparin Dosing Weight: 91.5 kg  Vital Signs: Temp: 98 F (36.7 C) (01/05 1728) Temp Source: Oral (01/05 1728) BP: 108/60 (01/05 1728) Pulse Rate: 59 (01/05 1728)  Labs:  Recent Labs  10/14/16 2057 10/15/16 0501  10/16/16 0858 10/16/16 1657 10/17/16 0805 10/17/16 1824  HGB 11.5*  11.4* 10.4*  --  9.6*  --  11.2*  --   HCT 34.9*  35.0* 32.0*  --  29.3*  --  34.3*  --   PLT 266  252 198  --  174  --  203  --   APTT  --  88*  --   --   --   --   --    LABPROT  --  15.4*  --   --   --   --   --   INR  --  1.21  --   --   --   --   --   HEPARINUNFRC  --   --   < > 0.51 0.41 0.26* 0.44  CREATININE 1.05 1.10  --  0.99  --   --   --   TROPONINI <0.03  --   --   --   --   --   --   < > = values in this interval not displayed.  Estimated Creatinine Clearance: 63.8 mL/min (by C-G formula based on SCr of 0.99 mg/dL).   Medications:  Infusions:  . heparin 1,050 Units/hr (10/17/16 1043)   Pharmacy will continue to monitor and adjust per consult.   Horris Latino, PharmD Pharmacy Resident 10/17/2016 7:30 PM

## 2016-10-17 NOTE — Progress Notes (Signed)
*  PRELIMINARY RESULTS* Echocardiogram 2D Echocardiogram has been performed.  Douglas Washington 10/17/2016, 9:23 AM

## 2016-10-17 NOTE — Progress Notes (Signed)
TEE procedure note Please see procedure note for complete details No vegetation noted on Watchman device or on pacemaker leads, No valvular vegetation noted Otherwise essentially a normal study No complications, Mr. Grudzien tolerated the procedure well Results discussed with patient and family  Signed, Dossie Arbour, MD, Ph.D [facility]

## 2016-10-17 NOTE — Care Management Important Message (Signed)
Important Message  Patient Details  Name: KARAM GOGUEN MRN: [id number] Date of Birth: 1929/04/02   Medicare Important Message Given:  Yes    Chapman Fitch, RN 10/17/2016, 12:48 PM

## 2016-10-17 NOTE — Progress Notes (Signed)
PT Cancellation Note  Patient Details Name: Douglas Washington MRN: [id number] DOB: 29-Dec-1928   Cancelled Treatment:    Reason Eval/Treat Not Completed: Patient declined, no reason specified.  PT consult received.  Chart reviewed.  Pt (and pt's family) reporting plan for pt to go to procedure in about 20 minutes (nursing confirmed this); pt declining PT at this time d/t wanting to rest prior to procedure.  Will re-attempt PT eval at a later date/time.   Irving Burton Travin Marik 10/17/2016, 3:46 PM Hendricks Limes, PT [phone]

## 2016-10-17 NOTE — Procedures (Addendum)
Transesophageal Echocardiogram :  Indication: bacteremia, concern for endocarditis Requesting/ordering  physician: Dr. Enedina Finner, Dr. Sampson Goon  Procedure: Benzocaine spray x2 and 2 mls x 2 of viscous lidocaine were given orally to provide local anesthesia to the oropharynx. The patient was positioned supine on the left side, bite block provided. The patient was moderately sedated with the doses of versed and fentanyl as detailed below.  Using digital technique an omniplane probe was advanced into the distal esophagus without incident.   Moderate sedation: 1. Sedation used:  Versed: 1 mg, Fentanyl: 50 ug 2. Time administered: 4 pm    Time when patient started recovery: 5 pm 3. I was face to face during this time  See report in EPIC  for complete details: In brief, no evidence of endocarditis Watchman device closely evaluated, pacemaker wires closely examined with no vegetation noted No significant vegetation on aortic valve, mitral valve Tricuspid valve and pulmonic valve not well visualized though grossly no vegetation  normal LV function with no RWMAs and no mural apical thrombus.  .  Estimated ejection fraction was 60%.  Right sided cardiac chambers were normal with no evidence of pulmonary hypertension.  Imaging of the septum showed no ASD or VSD Bubble study not performed   The LA was well visualized in orthogonal views.  There was no spontaneous contrast and no thrombus in the LA. Watchman device appears to be well seated.  Mild mitral valve regurgitation, mild TR, no significant aortic valve regurgitation or pulmonic regurgitation  The descending thoracic aorta had  no evidence of aneurysmal dilation or disection There is moderate diffuse atherosclerosis in the aortic arch, descending aorta   Douglas Washington  [facility] 10/17/2016

## 2016-10-17 NOTE — H&P (View-Only) (Signed)
PULMONARY / CRITICAL CARE MEDICINE   Name: SASUKE RHYAN MRN: [id number] DOB: 05/26/1929    ADMISSION DATE:  10/14/2016 CONSULTATION DATE:  10/15/16  REFERRING MD:  Dr. Arnaldo Natal  CHIEF COMPLAINT:  Shortness of breath  Patient came to [facility] for stent removal.  Patient was discharged home on 10/14/16. Around 7 pm on 10/15/16 he started feeling cold with chills and shaking.  Patient had a temp of 100.67f  EMS was called and his sats were down to 80'S. Patient was placed on O2 and PCCM team was called to admit the patient.  ASSESSMENT / PLAN:  PULMONARY A: Pulmonary Embolus - Subsegmental Lower lobe COPD Sleep Apnea -uses CPAP P:   Support with O2 to keep sats >88% Bronchodilators PRN Bipap PRN; cpap qhs. Continue Singulair Heparin per pharmacy consult past surgical history that includes Craniotomy (03/2005);    CARDIOVASCULAR A:  Hx of CHF Hypotension secondary to sepsis Pacemaker Hx of Afib P:  Continuous Telemetry Keep MAP goals>65 May need to start pressors  Will hold Coreg Continue Amiodarone Continue Plavix Will obtain ECHO Will obtain dopplers of lower extremities Received 1.5L bolus in ER   RENAL A:   Chronic Kidney Disease Hx of Urethral and renal stones -s/ stent removal on 1/2 Hypoalbuminemia P:   Replace electrolytes per Usual guideline Followed By Dr. Evelene Croon Follow Chemistry Continue Meropenem  Will follow cutures   GASTROINTESTINAL A:   No active issues P:   Will keep Npo for now   HEMATOLOGIC A:   No active issues P:  Heparin for DVT prophylaxis Will Transfuse per usual guidelines  INFECTIOUS A:   Sepsis secondary to Urine P:   Monitor fever curve Follow cultures Continue Meropenem Follow CBC Lactic acid-1.1   CULTURES: 10/14/16 BC>>negative.  10/14/16 UC>>negative to date.  10/14/16 Rapid influenza A and B >> Negative MRSA PCR 1/3; negative.   ANTIBIOTICS:  10/15/16 Gentamycin X1 dose for the procedure 10/15/16  Meropenem>>   ENDOCRINE A:   No active issues P:   BS intermittently with BMP  NEUROLOGIC A:   No active issues P:   Minimize sedating drugs Reorient as needed.  STUDIES:  09/12/16 ECHO TEE>>The cavity size was normal. Wall thickness was  normal. Systolic function was normal. The estimated ejection  fraction was in the range of 55% to 60%. 10/15/16 CT angio>> Sub segmental lower lobe embolus  SIGNIFICANT EVENTS: 10/15/16>> patient admitted to the ICU possibly due to sepsis secondary to Urine  LINES/TUBES: None    FAMILY  - Updates: Wife was updated regarding the plan of care by NP   ------------------------------------------------------------------  HISTORY OF PRESENT ILLNESS:   Harvard Quattrochi is an 83 yo male with multiple co-morbidities including CHF, COPD, Dysrhythmia, Chronic Kidney Disease, Hypertension, OSA on CPAP, Atrial Fibrillation, presence of cardiac pacemaker.  Patient had obstructive left urethral stone and renal stones who underwent ureteroscopy with stent placement followed by lithotripsy. Patient came to [facility] for stent removal.  Patient was discharged home on 10/14/16. Around 7 pm on 10/15/16 he started feeling cold with chills and shaking.  Patient had a temp of 100.66f  EMS was called and his sats were down to 80'S. Patient was placed on O2 and PCCM team was called to admit the patient.  PAST MEDICAL HISTORY :  He  has a past medical history of Allergy; Asthma; CHF (congestive heart failure) (HCC); Chronic kidney disease; COPD (chronic obstructive pulmonary disease) (HCC); Dysrhythmia; Glaucoma (increased eye pressure); Hearing loss; History of  shingles (4 plus years ago); Hypertension; OSA on CPAP; Persistent atrial fibrillation (HCC) (06/05/2016); Presence of permanent cardiac pacemaker; Renal insufficiency; Sebaceous cyst; Shortness of breath dyspnea; Sinus bradycardia; Traumatic brain injury (HCC) (01/2005); and Ventricular tachycardia (HCC).  PAST SURGICAL HISTORY: He   has a past surgical history that includes Craniotomy (03/2005); Shoulder acromioplasty (Bilateral); Total hip arthroplasty (Left, 2005); Cardiac catheterization (N/A, 06/06/2016); Pacemaker insertion (2011); Insert / replace / remove pacemaker; Joint replacement; Appendectomy; Ureteroscopy with holmium laser lithotripsy (Left, 07/15/2016); Cystoscopy with stent placement (Left, 07/15/2016); TEE without cardioversion (N/A, 07/21/2016); Left Atrial Appendage Occlusion (07/31/2016); Left Atrial Appendage Occlusion (N/A, 07/31/2016); TEE without cardioversion (N/A, 09/12/2016); and Extracorporeal shock wave lithotripsy (Left, 10/02/2016).  Allergies  Allergen Reactions  . Penicillins Anaphylaxis and Swelling    Has patient had a PCN reaction causing immediate rash, facial/tongue/throat swelling, SOB or lightheadedness with hypotension: Yes Has patient had a PCN reaction causing severe rash involving mucus membranes or skin necrosis: No Has patient had a PCN reaction that required hospitalization No Has patient had a PCN reaction occurring within the last 10 years: No If all of the above answers are "NO", then may proceed with Cephalosporin use.   . Adhesive [Tape] Other (See Comments)    Tears skin  . Ciprofloxacin Other (See Comments)    Unknown  . Oxycodone Other (See Comments)    Hallucinations  . Simvastatin Other (See Comments)    Weakness  . Statins Other (See Comments)    Weakness, couldn't walk  . Sulfa Antibiotics Swelling    No current facility-administered medications on file prior to encounter.    Current Outpatient Prescriptions on File Prior to Encounter  Medication Sig  . albuterol (PROVENTIL HFA;VENTOLIN HFA) 108 (90 BASE) MCG/ACT inhaler Inhale 2 puffs into the lungs every 6 (six) hours as needed for wheezing or shortness of breath.  Marland Kitchen amiodarone (PACERONE) 100 MG tablet Take 1 tablet (100 mg total) by mouth daily.  . brimonidine (ALPHAGAN) 0.2 % ophthalmic solution Place 1 drop  into the left eye 2 (two) times daily.  . carvedilol (COREG) 3.125 MG tablet Take 1 tablet (3.125 mg total) by mouth 2 (two) times daily with a meal.  . cetirizine (ZYRTEC) 10 MG tablet Take 10 mg by mouth daily as needed for allergies.   Marland Kitchen docusate sodium (COLACE) 100 MG capsule Take 2 capsules (200 mg total) by mouth 2 (two) times daily.  . dorzolamide (TRUSOPT) 2 % ophthalmic solution Place 1 drop into both eyes 2 (two) times daily.  . Glucosamine-Chondroit-Vit C-Mn (GLUCOSAMINE 1500 COMPLEX) CAPS Take 1 capsule by mouth daily.  Marland Kitchen latanoprost (XALATAN) 0.005 % ophthalmic solution Place 1 drop into both eyes at bedtime.  Marland Kitchen MEGARED OMEGA-3 KRILL OIL 500 MG CAPS Take 500 mg by mouth daily.   . montelukast (SINGULAIR) 10 MG tablet Take 10 mg by mouth at bedtime.  . Multiple Vitamins-Minerals (MULTIVITAMIN WITH MINERALS) tablet Take 1 tablet by mouth daily.  . timolol (TIMOPTIC) 0.5 % ophthalmic solution Place 1 drop into the left eye 2 (two) times daily.  . valACYclovir (VALTREX) 500 MG tablet Take 500 mg by mouth daily.  . clopidogrel (PLAVIX) 75 MG tablet Take 75 mg by mouth daily.  Marland Kitchen erythromycin ophthalmic ointment Place 1 application into both eyes at bedtime.   . ondansetron (ZOFRAN ODT) 8 MG disintegrating tablet Take 1 tablet (8 mg total) by mouth every 6 (six) hours as needed for nausea or vomiting. (Patient not taking: Reported on 10/09/2016)  FAMILY HISTORY:  His indicated that his mother is deceased. He indicated that his father is deceased. He indicated that the status of his brother is unknown. He indicated that his maternal grandmother is deceased. He indicated that his maternal grandfather is deceased. He indicated that his paternal grandmother is deceased. He indicated that his paternal grandfather is deceased.    SOCIAL HISTORY: He  reports that he quit smoking about 42 years ago. He quit smokeless tobacco use about 42 years ago. He reports that he does not drink alcohol or  use drugs.  REVIEW OF SYSTEMS:   Review of Systems  Constitutional: Positive for chills and fever. Negative for diaphoresis and weight loss.  HENT: Negative for congestion, ear discharge, ear pain, sinus pain and tinnitus.   Eyes: Negative for photophobia, pain and discharge.  Respiratory: Positive for shortness of breath. Negative for hemoptysis, sputum production and stridor.   Cardiovascular: Negative for palpitations, orthopnea, claudication and leg swelling.  Gastrointestinal: Negative for abdominal pain, blood in stool, constipation, diarrhea, heartburn, nausea and vomiting.  Genitourinary: Negative for frequency and urgency.  Musculoskeletal: Negative for back pain and joint pain.  Skin: Negative for itching and rash.  Neurological: Negative for tingling, sensory change, speech change, focal weakness, seizures, loss of consciousness and weakness.  Psychiatric/Behavioral: Negative for hallucinations and substance abuse. The patient is not nervous/anxious and does not have insomnia.      SUBJECTIVE:  Patient states that he is still cold but feels better.  Discussed code status with the patient and the patient wants CPR but no intubation in the event of cardio-respiratory arrest.  VITAL SIGNS: BP (!) 97/58   Pulse (!) 57   Temp 100 F (37.8 C) (Oral)   Resp (!) 23   SpO2 98%   HEMODYNAMICS:    VENTILATOR SETTINGS:    INTAKE / OUTPUT: No intake/output data recorded.  PHYSICAL EXAMINATION: General: Pleasantly appearing , elder gentleman, in no acute distress Neuro: Awake, Alert, oriented, no focal deficits noted HEENT:  AT, [place], no discharge,HOH,PERRLA Cardiovascular:  Paced, no MRG noted Lungs:  Clear bilaterally , no wheezes, crackles, rhonchi noted Abdomen: soft, non tender, active bowel sounds Musculoskeletal:  No inflammation/deformity noted Skin:  Grossly intact  LABS:  BMET  Recent Labs Lab 10/14/16 2057  NA 136  K 4.2  CL 107  CO2 24  BUN 21*   CREATININE 1.05  GLUCOSE 110*    Electrolytes  Recent Labs Lab 10/14/16 2057  CALCIUM 9.0    CBC  Recent Labs Lab 10/14/16 2057  WBC 4.8  4.7  HGB 11.5*  11.4*  HCT 34.9*  35.0*  PLT 266  252    Coag's No results for input(s): APTT, INR in the last 168 hours.  Sepsis Markers No results for input(s): LATICACIDVEN, PROCALCITON, O2SATVEN in the last 168 hours.  ABG No results for input(s): PHART, PCO2ART, PO2ART in the last 168 hours.  Liver Enzymes No results for input(s): AST, ALT, ALKPHOS, BILITOT, ALBUMIN in the last 168 hours.  Cardiac Enzymes  Recent Labs Lab 10/14/16 2057  TROPONINI <0.03    Glucose No results for input(s): GLUCAP in the last 168 hours.  Imaging Dg Chest 2 View  Result Date: 10/14/2016 CLINICAL DATA:  Body aches and tremors EXAM: CHEST  2 VIEW COMPARISON:  06/05/2016 FINDINGS: Bilateral shoulder replacements. Left-sided dual lead pacing device, similar in appearance. No acute infiltrate or effusion. Stable mild cardiomegaly without overt failure. Atherosclerosis. No pneumothorax. Multiple old right-sided rib fractures IMPRESSION:  1. Cardiomegaly without overt failure Electronically Signed   By: Jasmine Pang M.D.   On: 10/14/2016 22:01     Bincy Varughese,AG-ACNP Pulmonary and Critical Care Medicine [facility]   10/15/2016, 1:03 AM  Patient seen and examined, I concur with the findings, assessment and plan as documented by the NP, and as amended by me above. Continue CPAP daily at bedtime, continue heparin drip.  Wells Guiles, M.D.  10/15/2016  Critical Care Attestation.  I have personally obtained a history, examined the patient, evaluated laboratory and imaging results, formulated the assessment and plan and placed orders. The Patient requires high complexity decision making for assessment and support, frequent evaluation and titration of therapies, application of advanced monitoring technologies and extensive  interpretation of multiple databases. The patient has critical illness that could lead imminently to failure of 1 or more organ systems and requires the highest level of physician preparedness to intervene.  Critical Care Time devoted to patient care services described in this note is 45 minutes and is exclusive of time spent in procedures.

## 2016-10-17 NOTE — Progress Notes (Signed)
Per Dr. Allena Katz okay for patient to have ice chips at 6 and as long as everthing goes well  resume his normal diet at 7pm. Per MD do not go back and give morning meds missed while patient was NPO. Will administer carvedilol and vancomycin for evening meds.

## 2016-10-18 ENCOUNTER — Inpatient Hospital Stay: Payer: Medicare Other

## 2016-10-18 LAB — CBC
HEMATOCRIT: 33.2 % — AB (ref 40.0–52.0)
HEMOGLOBIN: 11 g/dL — AB (ref 13.0–18.0)
MCH: 28.4 pg (ref 26.0–34.0)
MCHC: 33 g/dL (ref 32.0–36.0)
MCV: 85.8 fL (ref 80.0–100.0)
Platelets: 205 10*3/uL (ref 150–440)
RBC: 3.87 MIL/uL — ABNORMAL LOW (ref 4.40–5.90)
RDW: 18.4 % — ABNORMAL HIGH (ref 11.5–14.5)
WBC: 5.9 10*3/uL (ref 3.8–10.6)

## 2016-10-18 LAB — CULTURE, BLOOD (ROUTINE X 2)

## 2016-10-18 LAB — HEPARIN LEVEL (UNFRACTIONATED): Heparin Unfractionated: 0.28 IU/mL — ABNORMAL LOW (ref 0.30–0.70)

## 2016-10-18 MED ORDER — APIXABAN 5 MG PO TABS
10.0000 mg | ORAL_TABLET | Freq: Two times a day (BID) | ORAL | Status: DC
Start: 2016-10-18 — End: 2016-10-19
  Administered 2016-10-18 – 2016-10-19 (×3): 10 mg via ORAL
  Filled 2016-10-18 (×3): qty 2

## 2016-10-18 MED ORDER — APIXABAN 5 MG PO TABS
5.0000 mg | ORAL_TABLET | Freq: Two times a day (BID) | ORAL | Status: DC
Start: 2016-10-25 — End: 2016-10-18

## 2016-10-18 MED ORDER — APIXABAN 5 MG PO TABS: 10.0000 mg | ORAL_TABLET | Freq: Two times a day (BID) | ORAL | Status: DC

## 2016-10-18 MED ORDER — HEPARIN BOLUS VIA INFUSION
1400.0000 [IU] | Freq: Once | INTRAVENOUS | Status: AC
  Administered 2016-10-18: 1400 [IU] via INTRAVENOUS
  Filled 2016-10-18: qty 1400

## 2016-10-18 MED ORDER — APIXABAN 5 MG PO TABS: 5.0000 mg | ORAL_TABLET | Freq: Two times a day (BID) | ORAL | Status: DC

## 2016-10-18 NOTE — Progress Notes (Signed)
[facility] - [place] at [facility]   PATIENT NAME: Douglas [facility]    MR#:  [id number]  DATE OF BIRTH:  June 22, 1929  SUBJECTIVE:  Came in with fever ans sepsis Doing bewtter On heparin gtt  REVIEW OF SYSTEMS:   Review of Systems  Constitutional: Negative for chills, fever and weight loss.  HENT: Negative for ear discharge, ear pain and nosebleeds.   Eyes: Negative for blurred vision, pain and discharge.  Respiratory: Negative for sputum production, shortness of breath, wheezing and stridor.   Cardiovascular: Negative for chest pain, palpitations, orthopnea and PND.  Gastrointestinal: Negative for abdominal pain, diarrhea, nausea and vomiting.  Genitourinary: Negative for frequency and urgency.  Musculoskeletal: Negative for back pain and joint pain.  Neurological: Negative for sensory change, speech change, focal weakness and weakness.  Psychiatric/Behavioral: Negative for depression and hallucinations. The patient is not nervous/anxious.    Tolerating Diet:yes Tolerating PT: HHPT  DRUG ALLERGIES:   Allergies  Allergen Reactions  . Penicillins Anaphylaxis and Swelling    Has patient had a PCN reaction causing immediate rash, facial/tongue/throat swelling, SOB or lightheadedness with hypotension: Yes Has patient had a PCN reaction causing severe rash involving mucus membranes or skin necrosis: No Has patient had a PCN reaction that required hospitalization No Has patient had a PCN reaction occurring within the last 10 years: No If all of the above answers are "NO", then may proceed with Cephalosporin use.   . Adhesive [Tape] Other (See Comments)    Tears skin  . Ciprofloxacin Other (See Comments)    Unknown  . Oxycodone Other (See Comments)    Hallucinations  . Simvastatin Other (See Comments)    Weakness  . Statins Other (See Comments)    Weakness, couldn't walk  . Sulfa Antibiotics Swelling    VITALS:  Blood pressure (!) 100/58, pulse 60,  temperature 97.7 F (36.5 C), temperature source Oral, resp. rate 17, height 5\' 10"  (1.778 m), weight 96.7 kg (213 lb 3.2 oz), SpO2 97 %.  PHYSICAL EXAMINATION:   Physical Exam  GENERAL:  83 y.o.-year-old patient lying in the bed with no acute distress.  EYES: Pupils equal, round, reactive to light and accommodation. No scleral icterus. Extraocular muscles intact.  HEENT: Head atraumatic, normocephalic. Oropharynx and nasopharynx clear.  NECK:  Supple, no jugular venous distention. No thyroid enlargement, no tenderness.  LUNGS: Normal breath sounds bilaterally, no wheezing, rales, rhonchi. No use of accessory muscles of respiration.  CARDIOVASCULAR: S1, S2 normal. No murmurs, rubs, or gallops.  ABDOMEN: Soft, nontender, nondistended. Bowel sounds present. No organomegaly or mass.  EXTREMITIES: No cyanosis, clubbing or edema b/l.    NEUROLOGIC: Cranial nerves II through XII are intact. No focal Motor or sensory deficits b/l.   PSYCHIATRIC:  patient is alert and oriented x 3.  SKIN: No obvious rash, lesion, or ulcer.   LABORATORY PANEL:  CBC  Recent Labs Lab 10/18/16 0253  WBC 5.9  HGB 11.0*  HCT 33.2*  PLT 205    Chemistries   Recent Labs Lab 10/15/16 0038 10/15/16 0501 10/16/16 0858  NA  --  137  --   K  --  3.7  --   CL  --  111  --   CO2  --  20*  --   GLUCOSE  --  123*  --   BUN  --  26*  --   CREATININE  --  1.10 0.99  CALCIUM  --  8.6*  --   MG  --  1.8  --   AST 16  --   --   ALT 13*  --   --   ALKPHOS 45  --   --   BILITOT 0.8  --   --    Cardiac Enzymes  Recent Labs Lab 10/14/16 2057  TROPONINI <0.03   RADIOLOGY:  No results found. ASSESSMENT AND PLAN:   83 yy.o male with multiple medical prblems including afib on eliquis (was on hold due to urology procedure) comes in with   1. Septic shock-Enterococcus ppted after ureteral stent removal couple days ago -repeat BC neg -Pt on Vanc and meropenem---change to IV vanc by ID per C/S -will need IV  abxs at home---PICC line placement today. D/w Dr Duke Salvia and ok for PICC line given watchman filter -TEE by dr Mariah Milling neg for vegetation -afebrile -improving  2. Left LL Pulmonary Embolus - Subsegmental Lower lobe and right LE DVT ( -pt's eliquis was on hold due to urology procedure -currently on heparin gtt---now on po eliquis -will change to po eliquis tomorrow -pt has h/o subdural hematoma but with PE and DVT will need blood thinners -past surgical history that includes Craniotomy (03/2005);   3. Chronic afib with recent Watchman filter placement in oct 2017 -TEE negtive today -cont Coreg  -Continue Amiodarone PO  4.Chronic Kidney Disease Hx of Urethral and renal stones -s/p stent removal on 10/15/15 by Dr Evelene Croon Hypoalbuminemia  -Replace electrolytes per Usual guideline Followed By Dr. Evelene Croon Continue Meropenem   5. OSA cont CPAP  6. PT t recommends HHPT  D/w wife and dter  Case discussed with Care Management/Social Worker. Management plans discussed with the patient, family and they are in agreement.  CODE STATUS: limited DVT Prophylaxis: on heparin gtt  TOTAL TIME TAKING CARE OF THIS PATIENT: 30 minutes.  >50% time spent on counselling and coordination of care  POSSIBLE D/C IN 1 DAYS, DEPENDING ON CLINICAL CONDITION.  Note: This dictation was prepared with Dragon dictation along with smaller phrase technology. Any transcriptional errors that result from this process are unintentional.  Swati Granberry M.D on 10/18/2016 at 4:42 PM  Between 7am to 6pm - Pager - [phone]  After 6pm go to [email or web address] - password [email or web address] [facility]  [facility] [place]  Office  [phone]  CC: Primary care physician; BABAOFF, Lavada Mesi, MD

## 2016-10-18 NOTE — Progress Notes (Signed)
ANTICOAGULATION CONSULT NOTE - Initial Consult  Pharmacy Consult for heparin Indication: pulmonary embolus   87 yom cc SOB s/p ureteroscopy with stent placement and subsequent stent removal. Febrile and desating per EMS. Pharmacy consulted to dose UFH for PE. Patient to transition from UFH to apixaban  Plan:  Apixaban 10 mg bid x 7 days followed by 5 mg bid. Instructed RN to discontinue heparin infusion at time of apixaban administration.    Allergies  Allergen Reactions  . Penicillins Anaphylaxis and Swelling    Has patient had a PCN reaction causing immediate rash, facial/tongue/throat swelling, SOB or lightheadedness with hypotension: Yes Has patient had a PCN reaction causing severe rash involving mucus membranes or skin necrosis: No Has patient had a PCN reaction that required hospitalization No Has patient had a PCN reaction occurring within the last 10 years: No If all of the above answers are "NO", then may proceed with Cephalosporin use.   . Adhesive [Tape] Other (See Comments)    Tears skin  . Ciprofloxacin Other (See Comments)    Unknown  . Oxycodone Other (See Comments)    Hallucinations  . Simvastatin Other (See Comments)    Weakness  . Statins Other (See Comments)    Weakness, couldn't walk  . Sulfa Antibiotics Swelling    Patient Measurements: Height: 5\' 10"  (177.8 cm) Weight: 213 lb 3.2 oz (96.7 kg) IBW/kg (Calculated) : 73 Heparin Dosing Weight: 91.5 kg  Vital Signs: Temp: 97.5 F (36.4 C) (01/06 0739) Temp Source: Oral (01/06 0739) BP: 113/61 (01/06 0739) Pulse Rate: 63 (01/06 0739)  Labs:  Recent Labs  10/16/16 0858  10/17/16 0805 10/17/16 1824 10/18/16 0253  HGB 9.6*  --  11.2*  --  11.0*  HCT 29.3*  --  34.3*  --  33.2*  PLT 174  --  203  --  205  HEPARINUNFRC 0.51  < > 0.26* 0.44 0.28*  CREATININE 0.99  --   --   --   --   < > = values in this interval not displayed.  Estimated Creatinine Clearance: 63.7 mL/min (by C-G formula based  on SCr of 0.99 mg/dL).   Pharmacy will continue to monitor and adjust per consult.   Luisa Hart, PharmD Clinical Pharmacist  10/18/2016 12:25 PM

## 2016-10-18 NOTE — Progress Notes (Signed)
[facility] here to place PICC.

## 2016-10-18 NOTE — Progress Notes (Signed)
ANTICOAGULATION CONSULT NOTE - Initial Consult  Pharmacy Consult for heparin Indication: pulmonary embolus   87 yom cc SOB s/p ureteroscopy with stent placement and subsequent stent removal. Febrile and desating per EMS. Pharmacy consulted to dose UFH for PE. Patient currently receiving heparin 1050 units/hr.   Goal of Therapy:  Heparin level 0.3-0.7 units/ml Monitor platelets by anticoagulation protocol: Yes   Plan:  01/05 @ 1830 anti-Xa level therapeutic at 0.44 x 1. Will re-check in 8 hours to confirm.  01/06 0253 HL subtherapeutic. 1400 units IV x 1 bolus and increase rate to 1200 units/hr. Will recheck HL in 8 hours. NAC   Allergies  Allergen Reactions  . Penicillins Anaphylaxis and Swelling    Has patient had a PCN reaction causing immediate rash, facial/tongue/throat swelling, SOB or lightheadedness with hypotension: Yes Has patient had a PCN reaction causing severe rash involving mucus membranes or skin necrosis: No Has patient had a PCN reaction that required hospitalization No Has patient had a PCN reaction occurring within the last 10 years: No If all of the above answers are "NO", then may proceed with Cephalosporin use.   . Adhesive [Tape] Other (See Comments)    Tears skin  . Ciprofloxacin Other (See Comments)    Unknown  . Oxycodone Other (See Comments)    Hallucinations  . Simvastatin Other (See Comments)    Weakness  . Statins Other (See Comments)    Weakness, couldn't walk  . Sulfa Antibiotics Swelling    Patient Measurements: Height: 5\' 10"  (177.8 cm) Weight: 214 lb 3.2 oz (97.2 kg) IBW/kg (Calculated) : 73 Heparin Dosing Weight: 91.5 kg  Vital Signs: Temp: 98 F (36.7 C) (01/05 1952) Temp Source: Oral (01/05 1952) BP: 111/59 (01/05 1952) Pulse Rate: 63 (01/05 1952)  Labs:  Recent Labs  10/15/16 0501  10/16/16 0858  10/17/16 0805 10/17/16 1824 10/18/16 0253  HGB 10.4*  --  9.6*  --  11.2*  --  11.0*  HCT 32.0*  --  29.3*  --  34.3*  --   33.2*  PLT 198  --  174  --  203  --  205  APTT 88*  --   --   --   --   --   --   LABPROT 15.4*  --   --   --   --   --   --   INR 1.21  --   --   --   --   --   --   HEPARINUNFRC  --   < > 0.51  < > 0.26* 0.44 0.28*  CREATININE 1.10  --  0.99  --   --   --   --   < > = values in this interval not displayed.  Estimated Creatinine Clearance: 63.8 mL/min (by C-G formula based on SCr of 0.99 mg/dL).   Medications:  Infusions:  . heparin 1,050 Units/hr (10/18/16 0308)   Pharmacy will continue to monitor and adjust per consult.   Horris Latino, PharmD Pharmacy Resident 10/18/2016 3:48 AM

## 2016-10-18 NOTE — Evaluation (Signed)
Physical Therapy Evaluation Patient Details Name: Douglas Washington MRN: [id number] DOB: 07-11-1929 Today's Date: 10/18/2016   History of Present Illness  Patient came to [facility] for stent removal. Patient was discharged home on 10/14/16. Around 7 pm on 10/15/16 he started feeling cold with chills and shaking. Was found to be in septic shock and to have L lower lobe PE and R femoral popliteal DVT.  Per chart review, heparin initiated 10/15/16.  Pt's PMH includes a-fib, CKD, CHF, TBI, hearing loss, glaucoma.    Clinical Impression  Pt admitted with above diagnosis. Pt currently with functional limitations due to the deficits listed below (see PT Problem List). [name] was Ind PTA occasionally using SPC when he felt unsteady, but very rarely.  He reports 1 fall in the past 6 months.  Today he ambulated 200 ft in the hallway with supervision with SpO2 remaining in high 90s on RA and HR from the 60s-90s.  He denied any pain.  Pt will benefit from skilled PT to increase their independence and safety with mobility to allow discharge to the venue listed below.      Follow Up Recommendations Outpatient PT    Equipment Recommendations  Rolling walker with 5" wheels    Recommendations for Other Services       Precautions / Restrictions Precautions Precautions: Fall Restrictions Weight Bearing Restrictions: No      Mobility  Bed Mobility Overal bed mobility: Needs Assistance Bed Mobility: Supine to Sit     Supine to sit: Min guard     General bed mobility comments: Pt requires increased time and effort but no physical assist needed.  Pt uses bed rail to pull up to sitting.  Transfers Overall transfer level: Needs assistance Equipment used: Rolling walker (2 wheeled) Transfers: Sit to/from Stand Sit to Stand: Min guard         General transfer comment: Cues for hand placement and technique.  Pt slow to stand.  Cues for upright posture once standing.  Ambulation/Gait Ambulation/Gait assistance:  Supervision Ambulation Distance (Feet): 200 Feet Assistive device: Rolling walker (2 wheeled) Gait Pattern/deviations: Step-through pattern;Decreased stride length;Trunk flexed   Gait velocity interpretation: at or above normal speed for age/gender General Gait Details: Trunk slightly flexed which improves with verbal cues for upright posture.  SpO2 remains in high 90s on RA and HR between 60 nd 90's throughout session.    Stairs            Wheelchair Mobility    Modified Rankin (Stroke Patients Only)       Balance Overall balance assessment: Needs assistance;History of Falls Sitting-balance support: No upper extremity supported;Feet supported Sitting balance-Leahy Scale: Good     Standing balance support: No upper extremity supported;During functional activity Standing balance-Leahy Scale: Fair Standing balance comment: Pt able to stand statically without UE support but relies on RW for dynamic activities                             Pertinent Vitals/Pain Pain Assessment: No/denies pain    Home Living Family/patient expects to be discharged to:: Private residence Living Arrangements: Spouse/significant other Available Help at Discharge: Family;Available 24 hours/day Type of Home: House Home Access: Stairs to enter Entrance Stairs-Rails: None Entrance Stairs-Number of Steps: 1 Home Layout: Laundry or work area in basement;Two level;Able to live on main level with bedroom/bathroom Home Equipment: Walker - standard;Cane - single point Additional Comments: computer and treadmill in basement which  pt does not need to get to, can stay on main level    Prior Function Level of Independence: Independent with assistive device(s)         Comments: Used SPC when he felt unsteady which was very rare.  Walks on the treadmill 2x/day for 15-20 mins each time.  Reports 1 fall in the past 6 months.     Hand Dominance        Extremity/Trunk Assessment   Upper  Extremity Assessment Upper Extremity Assessment: LUE deficits/detail;RUE deficits/detail RUE Deficits / Details: limited shoulder F ROM to ~80 deg due to h/o Bil shoulder replacement, strength WFL LUE Deficits / Details: limited shoulder F ROM to ~80 deg due to h/o Bil shoulder replacement, strength WFL    Lower Extremity Assessment Lower Extremity Assessment: Overall WFL for tasks assessed       Communication   Communication: HOH  Cognition Arousal/Alertness: Awake/alert Behavior During Therapy: WFL for tasks assessed/performed Overall Cognitive Status: Within Functional Limits for tasks assessed                      General Comments General comments (skin integrity, edema, etc.): Wife present throughout session    Exercises General Exercises - Upper Extremity Shoulder Flexion: AROM;Both;10 reps;Seated General Exercises - Lower Extremity Long Arc Quad: Strengthening;Both;10 reps;Seated Straight Leg Raises: Strengthening;Both;10 reps;Supine Hip Flexion/Marching: Strengthening;Both;10 reps;Seated   Assessment/Plan    PT Assessment Patient needs continued PT services  PT Problem List Decreased balance;Decreased knowledge of use of DME;Decreased safety awareness          PT Treatment Interventions DME instruction;Gait training;Stair training;Therapeutic activities;Therapeutic exercise;Functional mobility training;Balance training;Patient/family education;Neuromuscular re-education    PT Goals (Current goals can be found in the Care Plan section)  Acute Rehab PT Goals Patient Stated Goal: to go home PT Goal Formulation: With patient/family Time For Goal Achievement: 11/01/16 Potential to Achieve Goals: Good    Frequency Min 2X/week   Barriers to discharge        Co-evaluation               End of Session Equipment Utilized During Treatment: Gait belt Activity Tolerance: Patient tolerated treatment well Patient left: in chair;with call bell/phone  within reach;with chair alarm set;with family/visitor present Nurse Communication: Mobility status;Other (comment) (SpO2, HR)         Time: 1610-9604 PT Time Calculation (min) (ACUTE ONLY): 24 min   Charges:   PT Evaluation $PT Eval Low Complexity: 1 Procedure PT Treatments $Gait Training: 8-22 mins   PT G Codes:        Encarnacion Chu PT, DPT 10/18/2016, 11:39 AM

## 2016-10-19 LAB — CREATININE, SERUM: Creatinine, Ser: 0.99 mg/dL (ref 0.61–1.24)

## 2016-10-19 MED ORDER — VANCOMYCIN HCL IN DEXTROSE 1-5 GM/200ML-% IV SOLN: 1000.0000 mg | Freq: Two times a day (BID) | INTRAVENOUS | 0 refills | Status: AC

## 2016-10-19 MED ORDER — APIXABAN 5 MG PO TABS: ORAL_TABLET | ORAL | 1 refills | Status: DC

## 2016-10-19 NOTE — Care Management Note (Signed)
Case Management Note  Patient Details  Name: Douglas Washington MRN: [id number] Date of Birth: 07/05/1929  Subjective/Objective:      Discussed discharge planning with Mrs Fabien who verbalized understanding that a nurse from [facility] would be at their home tomorrow morning to teach the family how to administer [name] IV Vancomycin.Last dose given at [facility] on 10/19/16 at 4:30pm. Next dose due as soon as possible in the morning of 10/20/16. All orders for IV Vancomycin and HH-RN and PT were faxed to [facility]. Mrs Beymer requested that the RW be shipped to the home and that [name] could use his regular walker in the meantime. Alcario Drought RN will discharge [name] to home this evening as soon as his Vancomycin is given IV starting at 4:30pm. Family will transport [name] to home.               Action/Plan:   Expected Discharge Date:                  Expected Discharge Plan:     In-House Referral:     Discharge planning Services     Post Acute Care Choice:    Choice offered to:     DME Arranged:    DME Agency:     HH Arranged:    HH Agency:     Status of Service:     If discussed at [employer] of Stay Meetings, dates discussed:    Additional Comments:  Tiffny Gemmer A, RN 10/19/2016, 12:29 PM

## 2016-10-19 NOTE — Progress Notes (Signed)
MD ordered patient to be discharged home.  Discharge instructions were reviewed with the patient , wife and daughter and they voiced understanding. Patient instructed on making the follow-up appointments..  Prescription given to the patient. Patient finished the 1630 Vancomycin at 1730.  PICC was flushed.   All families questions were answered.  Patient left via wheelchair escorted by family and auxillary.

## 2016-10-19 NOTE — Discharge Summary (Signed)
[facility] - [place] at [facility]   PATIENT NAME: Douglas Washington    MR#:  [id number]  DATE OF BIRTH:  September 12, 1929  DATE OF ADMISSION:  10/14/2016 ADMITTING PHYSICIAN: Shane Crutch, MD  DATE OF DISCHARGE: 10/19/16  PRIMARY CARE PHYSICIAN: BABAOFF, Lavada Mesi, MD    ADMISSION DIAGNOSIS:  Sepsis, due to unspecified organism (HCC) [A41.9]  DISCHARGE DIAGNOSIS:  Enterococcus faecalis Sepsis Acute PE and DVT right leg-now on eliquis Recent ureteral stent removal Chronic Afib s/p Watchman filter placement in October 2017  SECONDARY DIAGNOSIS:   Past Medical History:  Diagnosis Date  . Allergy   . Asthma   . CHF (congestive heart failure) (HCC)    10/17 WATCHMAN FILTER IMPLANTED  . Chronic kidney disease    Kidney stones  . COPD (chronic obstructive pulmonary disease) (HCC)   . Dysrhythmia    WATCHMAN FILTER 10/17  . Glaucoma (increased eye pressure)   . Hearing loss   . History of shingles 4 plus years ago   set in left eye  . Hypertension   . OSA on CPAP   . Persistent atrial fibrillation (HCC) 06/05/2016  . Presence of permanent cardiac pacemaker    2010  . Renal insufficiency   . Sebaceous cyst    Upper back  . Shortness of breath dyspnea    with exertion  . Sinus bradycardia    s/p MDT PPM   . Traumatic brain injury (HCC) 01/2005  . Ventricular tachycardia (HCC)    05/2016 requiring DCCV - started on amiodarone     HOSPITAL COURSE:  83 yy.o male with multiple medical prblems including afib on eliquis (was on hold due to urology procedure) comes in with   1. Septic shock-Enterococcus ppted after ureteral stent removal couple days ago -repeat BC neg -IV vanc by ID per C/S till jan 18th -will need IV abxs at home---s/p PICC line placement on jan 7th - D/w Dr Duke Salvia and ok for PICC line given watchman filter -TEE by dr Mariah Milling neg for vegetation -afebrile  2. Left LL Pulmonary Embolus - Subsegmental Lower lobe and right LE DVT ( -pt's  eliquis was on hold due to urology procedure -currently on heparin gtt---now on po eliquis -will change to po eliquis tomorrow -pt has h/o subdural hematoma but with PE and DVT will need blood thinners -past surgical history that includes Craniotomy (03/2005);   3. Chronic afib with recent Watchman filter placement in oct 2017 -TEE negtive today -cont Coreg  -Continue Amiodarone PO  4.Chronic Kidney Disease Hx of Urethral and renal stones -s/pstent removal on 10/15/15 by Dr Evelene Croon Hypoalbuminemia  -Replace electrolytes per Usual guideline Followed By Dr. Evelene Croon Continue Meropenem   5. OSA cont CPAP  6. PT t recommends HHPT  D/c today CONSULTS OBTAINED:  Treatment Team:  Orson Ape, MD Mick Sell, MD Antonieta Iba, MD  DRUG ALLERGIES:   Allergies  Allergen Reactions  . Penicillins Anaphylaxis and Swelling    Has patient had a PCN reaction causing immediate rash, facial/tongue/throat swelling, SOB or lightheadedness with hypotension: Yes Has patient had a PCN reaction causing severe rash involving mucus membranes or skin necrosis: No Has patient had a PCN reaction that required hospitalization No Has patient had a PCN reaction occurring within the last 10 years: No If all of the above answers are "NO", then may proceed with Cephalosporin use.   . Adhesive [Tape] Other (See Comments)    Tears skin  . Ciprofloxacin Other (  See Comments)    Unknown  . Oxycodone Other (See Comments)    Hallucinations  . Simvastatin Other (See Comments)    Weakness  . Statins Other (See Comments)    Weakness, couldn't walk  . Sulfa Antibiotics Swelling    DISCHARGE MEDICATIONS:   Current Discharge Medication List    START taking these medications   Details  apixaban (ELIQUIS) 5 MG TABS tablet Take 2 tabs (10mg  ) twice a day till 10/24/16 and then Take 1 tab (5 mg)  Twice a day Qty: 60 tablet, Refills: 1    vancomycin (VANCOCIN) 1-5 GM/200ML-% SOLN Inject 200  mLs (1,000 mg total) into the vein every 12 (twelve) hours. Qty: 4000 mL, Refills: 0      CONTINUE these medications which have NOT CHANGED   Details  albuterol (PROVENTIL HFA;VENTOLIN HFA) 108 (90 BASE) MCG/ACT inhaler Inhale 2 puffs into the lungs every 6 (six) hours as needed for wheezing or shortness of breath.    brimonidine (ALPHAGAN) 0.2 % ophthalmic solution Place 1 drop into the left eye 2 (two) times daily. Refills: 5    carvedilol (COREG) 3.125 MG tablet Take 1 tablet (3.125 mg total) by mouth 2 (two) times daily with a meal. Qty: 180 tablet, Refills: 3    cetirizine (ZYRTEC) 10 MG tablet Take 10 mg by mouth daily as needed for allergies.     docusate sodium (COLACE) 100 MG capsule Take 2 capsules (200 mg total) by mouth 2 (two) times daily. Qty: 120 capsule, Refills: 3    dorzolamide (TRUSOPT) 2 % ophthalmic solution Place 1 drop into both eyes 2 (two) times daily.    Glucosamine-Chondroit-Vit C-Mn (GLUCOSAMINE 1500 COMPLEX) CAPS Take 1 capsule by mouth daily.    latanoprost (XALATAN) 0.005 % ophthalmic solution Place 1 drop into both eyes at bedtime.    MEGARED OMEGA-3 KRILL OIL 500 MG CAPS Take 500 mg by mouth daily.     montelukast (SINGULAIR) 10 MG tablet Take 10 mg by mouth at bedtime.    Multiple Vitamins-Minerals (MULTIVITAMIN WITH MINERALS) tablet Take 1 tablet by mouth daily.    timolol (TIMOPTIC) 0.5 % ophthalmic solution Place 1 drop into the left eye 2 (two) times daily. Refills: 4    valACYclovir (VALTREX) 500 MG tablet Take 500 mg by mouth daily.    amiodarone (PACERONE) 200 MG tablet Take 0.5 tablets (100 mg total) by mouth daily. Qty: 15 tablet, Refills: 6    clopidogrel (PLAVIX) 75 MG tablet Take 75 mg by mouth daily.    erythromycin ophthalmic ointment Place 1 application into both eyes at bedtime.  Refills: 3    ondansetron (ZOFRAN ODT) 8 MG disintegrating tablet Take 1 tablet (8 mg total) by mouth every 6 (six) hours as needed for nausea or  vomiting. Qty: 10 tablet, Refills: 3        If you experience worsening of your admission symptoms, develop shortness of breath, life threatening emergency, suicidal or homicidal thoughts you must seek medical attention immediately by calling 911 or calling your MD immediately  if symptoms less severe.  You Must read complete instructions/literature along with all the possible adverse reactions/side effects for all the Medicines you take and that have been prescribed to you. Take any new Medicines after you have completely understood and accept all the possible adverse reactions/side effects.   Please note  You were cared for by a hospitalist during your hospital stay. If you have any questions about your discharge medications or the care  you received while you were in the hospital after you are discharged, you can call the unit and asked to speak with the hospitalist on call if the hospitalist that took care of you is not available. Once you are discharged, your primary care physician will handle any further medical issues. Please note that NO REFILLS for any discharge medications will be authorized once you are discharged, as it is imperative that you return to your primary care physician (or establish a relationship with a primary care physician if you do not have one) for your aftercare needs so that they can reassess your need for medications and monitor your lab values. Today   SUBJECTIVE   Doing well wife int eh room  VITAL SIGNS:  Blood pressure 113/76, pulse (!) 51, temperature 97.6 F (36.4 C), temperature source Oral, resp. rate 17, height 5\' 10"  (1.778 m), weight 92.9 kg (204 lb 12.8 oz), SpO2 91 %.  I/O:   Intake/Output Summary (Last 24 hours) at 10/19/16 0930 Last data filed at 10/19/16 0406  Gross per 24 hour  Intake             1160 ml  Output             1610 ml  Net             -450 ml    PHYSICAL EXAMINATION:  GENERAL:  83 y.o.-year-old patient lying in the bed  with no acute distress.  EYES: Pupils equal, round, reactive to light and accommodation. No scleral icterus. Extraocular muscles intact.  HEENT: Head atraumatic, normocephalic. Oropharynx and nasopharynx clear.  NECK:  Supple, no jugular venous distention. No thyroid enlargement, no tenderness.  LUNGS: Normal breath sounds bilaterally, no wheezing, rales,rhonchi or crepitation. No use of accessory muscles of respiration.  CARDIOVASCULAR: S1, S2 normal. No murmurs, rubs, or gallops.  ABDOMEN: Soft, non-tender, non-distended. Bowel sounds present. No organomegaly or mass.  EXTREMITIES: No pedal edema, cyanosis, or clubbing. Right UE PICC NEUROLOGIC: Cranial nerves II through XII are intact. Muscle strength 5/5 in all extremities. Sensation intact. Gait not checked.  PSYCHIATRIC: patient is alert and oriented x 3.  SKIN: No obvious rash, lesion, or ulcer.   DATA REVIEW:   CBC   Recent Labs Lab 10/18/16 0253  WBC 5.9  HGB 11.0*  HCT 33.2*  PLT 205    Chemistries   Recent Labs Lab 10/15/16 0038 10/15/16 0501  10/19/16 0529  NA  --  137  --   --   K  --  3.7  --   --   CL  --  111  --   --   CO2  --  20*  --   --   GLUCOSE  --  123*  --   --   BUN  --  26*  --   --   CREATININE  --  1.10  < > 0.99  CALCIUM  --  8.6*  --   --   MG  --  1.8  --   --   AST 16  --   --   --   ALT 13*  --   --   --   ALKPHOS 45  --   --   --   BILITOT 0.8  --   --   --   < > = values in this interval not displayed.  Microbiology Results   Recent Results (from the past 240 hour(s))  Urine culture  Status: Abnormal   Collection Time: 10/14/16  9:49 PM  Result Value Ref Range Status   Specimen Description URINE, CLEAN CATCH  Final   Special Requests NONE  Final   Culture MULTIPLE SPECIES PRESENT, SUGGEST RECOLLECTION (A)  Final   Report Status 10/16/2016 FINAL  Final  Culture, blood (routine x 2)     Status: Abnormal   Collection Time: 10/14/16  9:49 PM  Result Value Ref Range Status    Specimen Description BLOOD  RIGHT HAND  Final   Special Requests   Final    BOTTLES DRAWN AEROBIC AND ANAEROBIC  ANA AER   Culture  Setup Time   Final    GRAM POSITIVE COCCI IN BOTH AEROBIC AND ANAEROBIC BOTTLES CRITICAL RESULT CALLED TO, READ BACK BY AND VERIFIED WITH: Nicholaus Bloom [facility] AT 1904 10/15/2016 BY TFK.    Culture (A)  Final    ENTEROCOCCUS FAECALIS SUSCEPTIBILITIES PERFORMED ON PREVIOUS CULTURE WITHIN THE LAST 5 DAYS. Performed at [facility]    Report Status 10/18/2016 FINAL  Final  Culture, blood (routine x 2)     Status: Abnormal   Collection Time: 10/14/16  9:49 PM  Result Value Ref Range Status   Specimen Description BLOOD  RIGHT [facility]  Final   Special Requests   Final    BOTTLES DRAWN AEROBIC AND ANAEROBIC  ANA AER   Culture  Setup Time   Final    GRAM POSITIVE COCCI IN BOTH AEROBIC AND ANAEROBIC BOTTLES CRITICAL RESULT CALLED TO, READ BACK BY AND VERIFIED WITH: Nicholaus Bloom [facility] AT 1904 10/15/2016 BY TFK. Performed at [facility]    Culture ENTEROCOCCUS FAECALIS (A)  Final   Report Status 10/18/2016 FINAL  Final   Organism ID, Bacteria ENTEROCOCCUS FAECALIS  Final      Susceptibility   Enterococcus faecalis - MIC*    AMPICILLIN <=2 SENSITIVE Sensitive     VANCOMYCIN <=0.5 SENSITIVE Sensitive     GENTAMICIN SYNERGY SENSITIVE Sensitive     * ENTEROCOCCUS FAECALIS  Rapid Influenza A&B Antigens ([facility] only)     Status: None   Collection Time: 10/14/16  9:49 PM  Result Value Ref Range Status   Influenza A ([facility]) NEGATIVE NEGATIVE Final   Influenza B ([facility]) NEGATIVE NEGATIVE Final  Blood Culture ID Panel (Reflexed)     Status: Abnormal   Collection Time: 10/14/16  9:49 PM  Result Value Ref Range Status   Enterococcus species DETECTED (A) NOT DETECTED Final    Comment: CRITICAL RESULT CALLED TO, READ BACK BY AND VERIFIED WITH: Nicholaus Bloom [facility] AT 1904 10/15/2016 BY TFK.    Vancomycin resistance NOT DETECTED NOT DETECTED Final    Listeria monocytogenes NOT DETECTED NOT DETECTED Final   Staphylococcus species NOT DETECTED NOT DETECTED Final   Staphylococcus aureus NOT DETECTED NOT DETECTED Final   Streptococcus species NOT DETECTED NOT DETECTED Final   Streptococcus agalactiae NOT DETECTED NOT DETECTED Final   Streptococcus pneumoniae NOT DETECTED NOT DETECTED Final   Streptococcus pyogenes NOT DETECTED NOT DETECTED Final   Acinetobacter baumannii NOT DETECTED NOT DETECTED Final   Enterobacteriaceae species NOT DETECTED NOT DETECTED Final   Enterobacter cloacae complex NOT DETECTED NOT DETECTED Final   Escherichia coli NOT DETECTED NOT DETECTED Final   Klebsiella oxytoca NOT DETECTED NOT DETECTED Final   Klebsiella pneumoniae NOT DETECTED NOT DETECTED Final   Proteus species NOT DETECTED NOT DETECTED Final   Serratia marcescens NOT DETECTED NOT DETECTED Final   Haemophilus influenzae NOT  DETECTED NOT DETECTED Final   Neisseria meningitidis NOT DETECTED NOT DETECTED Final   Pseudomonas aeruginosa NOT DETECTED NOT DETECTED Final   Candida albicans NOT DETECTED NOT DETECTED Final   Candida glabrata NOT DETECTED NOT DETECTED Final   Candida krusei NOT DETECTED NOT DETECTED Final   Candida parapsilosis NOT DETECTED NOT DETECTED Final   Candida tropicalis NOT DETECTED NOT DETECTED Final  MRSA PCR Screening     Status: None   Collection Time: 10/15/16  2:19 AM  Result Value Ref Range Status   MRSA by PCR NEGATIVE NEGATIVE Final    Comment:        The GeneXpert MRSA Assay (FDA approved for NASAL specimens only), is one component of a comprehensive MRSA colonization surveillance program. It is not intended to diagnose MRSA infection nor to guide or monitor treatment for MRSA infections.   Culture, blood (Routine X 2) w Reflex to ID Panel     Status: None (Preliminary result)   Collection Time: 10/16/16  2:06 PM  Result Value Ref Range Status   Specimen Description BLOOD  LEFT HAND  Final   Special Requests    Final    BOTTLES DRAWN AEROBIC AND ANAEROBIC  AER 15 ML ANA 10 ML   Culture NO GROWTH 3 DAYS  Final   Report Status PENDING  Incomplete  Culture, blood (Routine X 2) w Reflex to ID Panel     Status: None (Preliminary result)   Collection Time: 10/16/16  2:06 PM  Result Value Ref Range Status   Specimen Description BLOOD  LEFT AC  Final   Special Requests   Final    BOTTLES DRAWN AEROBIC AND ANAEROBIC  AER 1 ML ANA 2 ML   Culture NO GROWTH 3 DAYS  Final   Report Status PENDING  Incomplete    RADIOLOGY:  Dg Chest Port 1 View  Result Date: 10/18/2016 CLINICAL DATA:  Sepsis. EXAM: PORTABLE CHEST 1 VIEW COMPARISON:  10/14/2016 FINDINGS: Mild cardiomegaly, unchanged. Hiatal hernia. The lungs are clear. The pulmonary vasculature is normal. There is no pleural effusion. Hilar and mediastinal contours are unremarkable and unchanged. Right upper extremity PICC line extends to the upper SVC. Transvenous cardiac leads appear intact. Chronic healed fracture deformities of multiple right ribs. IMPRESSION: Right upper extremity PICC line appears satisfactorily positioned. Unchanged cardiomegaly and hiatal hernia. No acute cardiopulmonary findings. Electronically Signed   By: Ellery Plunk M.D.   On: 10/18/2016 21:50     Management plans discussed with the patient, family and they are in agreement.  CODE STATUS:     Code Status Orders        Start     Ordered   10/15/16 0055  Limited resuscitation (code)  Continuous    Question Answer Comment  In the event of cardiac or respiratory ARREST: Initiate Code Blue, Call Rapid Response Yes   In the event of cardiac or respiratory ARREST: Perform CPR Yes   In the event of cardiac or respiratory ARREST: Perform Intubation/Mechanical Ventilation No   In the event of cardiac or respiratory ARREST: Use NIPPV/BiPAp only if indicated Yes   In the event of cardiac or respiratory ARREST: Administer ACLS medications if indicated Yes   In the event of cardiac  or respiratory ARREST: Perform Defibrillation or Cardioversion if indicated Yes      10/15/16 0055    Code Status History    Date Active Date Inactive Code Status Order ID Comments User Context   07/31/2016  4:32  PM 08/01/2016  1:36 PM Full Code [id number]  [name], MD Inpatient   06/05/2016  7:44 PM 06/09/2016  9:27 PM Full Code [id number]  Darrol Jump, PA-C ED    Advance Directive Documentation   Flowsheet Row Most Recent Value  Type of Advance Directive  Healthcare Power of Attorney, Living will  Pre-existing out of facility DNR order (yellow form or pink MOST form)  No data  "MOST" Form in Place?  No data      TOTAL TIME TAKING CARE OF THIS PATIENT: 40 minutes.    Johntavius Shepard M.D on 10/19/2016 at 9:30 AM  Between 7am to 6pm - Pager - [phone] After 6pm go to [email or web address] - password [email or web address] [facility]  [facility]  Office  [phone]  CC: Primary care physician; BABAOFF, Lavada Mesi, MD

## 2016-10-19 NOTE — Discharge Instructions (Signed)
HHRN for IV vancomycin. To teach family also PICC line care  Information on my medicine - ELIQUIS (apixaban)  This medication education was reviewed with me or my healthcare representative as part of my discharge preparation.  The pharmacist that spoke with me during my hospital stay was:  Martyn Malay, [facility]  Why was Eliquis prescribed for you? Eliquis was prescribed to treat blood clots that may have been found in the veins of your legs (deep vein thrombosis) or in your lungs (pulmonary embolism) and to reduce the risk of them occurring again.  What do You need to know about Eliquis ? The starting dose is 10 mg (two 5 mg tablets) taken TWICE daily for the FIRST SEVEN (7) DAYS, then on (enter date)  January 13th  the dose is reduced to ONE 5 mg tablet taken TWICE daily.  Eliquis may be taken with or without food.   Try to take the dose about the same time in the morning and in the evening. If you have difficulty swallowing the tablet whole please discuss with your pharmacist how to take the medication safely.  Take Eliquis exactly as prescribed and DO NOT stop taking Eliquis without talking to the doctor who prescribed the medication.  Stopping may increase your risk of developing a new blood clot.  Refill your prescription before you run out.  After discharge, you should have regular check-up appointments with your healthcare provider that is prescribing your Eliquis.    What do you do if you miss a dose? If a dose of ELIQUIS is not taken at the scheduled time, take it as soon as possible on the same day and twice-daily administration should be resumed. The dose should not be doubled to make up for a missed dose.  Important Safety Information A possible side effect of Eliquis is bleeding. You should call your healthcare provider right away if you experience any of the following: ? Bleeding from an injury or your nose that does not stop. ? Unusual colored urine (red or dark  brown) or unusual colored stools (red or black). ? Unusual bruising for unknown reasons. ? A serious fall or if you hit your head (even if there is no bleeding).  Some medicines may interact with Eliquis and might increase your risk of bleeding or clotting while on Eliquis. To help avoid this, consult your healthcare provider or pharmacist prior to using any new prescription or non-prescription medications, including herbals, vitamins, non-steroidal anti-inflammatory drugs (NSAIDs) and supplements.  This website has more information on Eliquis (apixaban): http://www.eliquis.com/eliquis/home

## 2016-10-20 ENCOUNTER — Encounter: Payer: Self-pay | Admitting: Respiratory Therapy

## 2016-10-20 ENCOUNTER — Telehealth: Payer: Self-pay | Admitting: Respiratory Therapy

## 2016-10-20 ENCOUNTER — Ambulatory Visit: Payer: Medicare Other

## 2016-10-20 NOTE — Telephone Encounter (Signed)
Done

## 2016-10-20 NOTE — Telephone Encounter (Signed)
Called Mr Rizer to check on his condition from his hospitalization. I spoke with Mrs Sneath who states Mr Welle will have 2 weeks of antibiotics through his IV line and will have home nursing and PT therapy for several weeks. Mr Jarred does want to return to [facility] after clearance from his physician.

## 2016-10-21 LAB — CULTURE, BLOOD (ROUTINE X 2)
Culture: NO GROWTH
Culture: NO GROWTH

## 2016-10-22 ENCOUNTER — Ambulatory Visit: Payer: Medicare Other

## 2016-10-24 ENCOUNTER — Ambulatory Visit: Payer: Medicare Other

## 2016-10-27 ENCOUNTER — Encounter: Payer: Self-pay | Admitting: Respiratory Therapy

## 2016-10-27 ENCOUNTER — Ambulatory Visit: Payer: Medicare Other

## 2016-10-27 DIAGNOSIS — I5020 Unspecified systolic (congestive) heart failure: Secondary | ICD-10-CM

## 2016-10-27 DIAGNOSIS — J45909 Unspecified asthma, uncomplicated: Secondary | ICD-10-CM

## 2016-10-27 NOTE — Progress Notes (Signed)
Pulmonary Individual Treatment Plan  Patient Details  Name: Douglas Washington MRN: [id number] Date of Birth: 01/10/1929 Referring Provider:    Initial Encounter Date:  Flowsheet Row Pulmonary Rehab from 09/30/2016 in [facility] Cardiac and Pulmonary Rehab  Date  09/30/16      Visit Diagnosis: Systolic congestive heart failure, unspecified congestive heart failure chronicity (HCC)  Asthma, unspecified asthma severity, unspecified whether complicated, unspecified whether persistent  Patient's Home Medications on Admission:  Current Outpatient Prescriptions:    albuterol (PROVENTIL HFA;VENTOLIN HFA) 108 (90 BASE) MCG/ACT inhaler, Inhale 2 puffs into the lungs every 6 (six) hours as needed for wheezing or shortness of breath., Disp: , Rfl:    amiodarone (PACERONE) 200 MG tablet, Take 0.5 tablets (100 mg total) by mouth daily., Disp: 15 tablet, Rfl: 6   apixaban (ELIQUIS) 5 MG TABS tablet, Take 2 tabs (10mg  ) twice a day till 10/24/16 and then Take 1 tab (5 mg)  Twice a day, Disp: 60 tablet, Rfl: 1   brimonidine (ALPHAGAN) 0.2 % ophthalmic solution, Place 1 drop into the left eye 2 (two) times daily., Disp: , Rfl: 5   carvedilol (COREG) 3.125 MG tablet, Take 1 tablet (3.125 mg total) by mouth 2 (two) times daily with a meal., Disp: 180 tablet, Rfl: 3   cetirizine (ZYRTEC) 10 MG tablet, Take 10 mg by mouth daily as needed for allergies. , Disp: , Rfl:    clopidogrel (PLAVIX) 75 MG tablet, Take 75 mg by mouth daily., Disp: , Rfl:    docusate sodium (COLACE) 100 MG capsule, Take 2 capsules (200 mg total) by mouth 2 (two) times daily., Disp: 120 capsule, Rfl: 3   dorzolamide (TRUSOPT) 2 % ophthalmic solution, Place 1 drop into both eyes 2 (two) times daily., Disp: , Rfl:    erythromycin ophthalmic ointment, Place 1 application into both eyes at bedtime. , Disp: , Rfl: 3   Glucosamine-Chondroit-Vit C-Mn (GLUCOSAMINE 1500 COMPLEX) CAPS, Take 1 capsule by mouth daily., Disp: , Rfl:    latanoprost  (XALATAN) 0.005 % ophthalmic solution, Place 1 drop into both eyes at bedtime., Disp: , Rfl:    MEGARED OMEGA-3 KRILL OIL 500 MG CAPS, Take 500 mg by mouth daily. , Disp: , Rfl:    montelukast (SINGULAIR) 10 MG tablet, Take 10 mg by mouth at bedtime., Disp: , Rfl:    Multiple Vitamins-Minerals (MULTIVITAMIN WITH MINERALS) tablet, Take 1 tablet by mouth daily., Disp: , Rfl:    ondansetron (ZOFRAN ODT) 8 MG disintegrating tablet, Take 1 tablet (8 mg total) by mouth every 6 (six) hours as needed for nausea or vomiting. (Patient not taking: Reported on 10/09/2016), Disp: 10 tablet, Rfl: 3   timolol (TIMOPTIC) 0.5 % ophthalmic solution, Place 1 drop into the left eye 2 (two) times daily., Disp: , Rfl: 4   valACYclovir (VALTREX) 500 MG tablet, Take 500 mg by mouth daily., Disp: , Rfl:    vancomycin (VANCOCIN) 1-5 GM/200ML-% SOLN, Inject 200 mLs (1,000 mg total) into the vein every 12 (twelve) hours., Disp: 4000 mL, Rfl: 0  Past Medical History: Past Medical History:  Diagnosis Date   Allergy    Asthma    CHF (congestive heart failure) (HCC)    10/17 WATCHMAN FILTER IMPLANTED   Chronic kidney disease    Kidney stones   COPD (chronic obstructive pulmonary disease) (HCC)    Dysrhythmia    WATCHMAN FILTER 10/17   Glaucoma (increased eye pressure)    Hearing loss    History of shingles 4  plus years ago   set in left eye   Hypertension    OSA on CPAP    Persistent atrial fibrillation (HCC) 06/05/2016   Presence of permanent cardiac pacemaker    2010   Renal insufficiency    Sebaceous cyst    Upper back   Shortness of breath dyspnea    with exertion   Sinus bradycardia    s/p MDT PPM    Traumatic brain injury (HCC) 01/2005   Ventricular tachycardia (HCC)    05/2016 requiring DCCV - started on amiodarone     Tobacco Use: History  Smoking Status   Former Smoker   Quit date: 04/14/1974  Smokeless Tobacco   Former User   Quit date: 04/14/1974     Labs: Recent Review Flowsheet Data    Labs for ITP Cardiac and Pulmonary Rehab Latest Ref Rng & Units 06/05/2016 06/05/2016 06/05/2016   Hemoglobin A1c 4.8 - 5.6 % - - 6.2(H)   HCO3 20.0 - 24.0 mEq/L 15.9(L) - -   TCO2 0 - 100 mmol/L 17 18 -   ACIDBASEDEF 0.0 - 2.0 mmol/L 10.0(H) - -   O2SAT % 94.0 - -       ADL UCSD:     Pulmonary Assessment Scores    Row Name 09/30/16 1209         ADL UCSD   ADL Phase Entry     SOB Score total 13     Rest 0     Walk 1     Stairs 2     Bath 0     Dress 1     Shop 1        Pulmonary Function Assessment:     Pulmonary Function Assessment - 09/30/16 1208      Pulmonary Function Tests   FVC% 65 %   FEV1% 79 %   FEV1/FVC Ratio 87.43     Initial Spirometry Results   Comments Test performed 09/30/16     Breath   Bilateral Breath Sounds Clear   Shortness of Breath No      Exercise Target Goals:    Exercise Program Goal: Individual exercise prescription set with THRR, safety & activity barriers. Participant demonstrates ability to understand and report RPE using BORG scale, to self-measure pulse accurately, and to acknowledge the importance of the exercise prescription.  Exercise Prescription Goal: Starting with aerobic activity 30 plus minutes a day, 3 days per week for initial exercise prescription. Provide home exercise prescription and guidelines that participant acknowledges understanding prior to discharge.  Activity Barriers & Risk Stratification:     Activity Barriers & Cardiac Risk Stratification - 09/30/16 1203      Activity Barriers & Cardiac Risk Stratification   Activity Barriers Other (comment)   Comments Pacemaker,  atrial fib   Cardiac Risk Stratification Moderate      6 Minute Walk:     6 Minute Walk    Row Name 09/30/16 1219         6 Minute Walk   Distance 730 feet     Walk Time 6 minutes     # of Rest Breaks 0     MPH 1.38     METS 2     RPE 13     Perceived Dyspnea  3     VO2 Peak  3.85     Symptoms No     Resting HR 64 bpm     Resting BP 118/60  Max Ex. HR 125 bpm     Max Ex. BP 122/60       Interval HR   Baseline HR 64     1 Minute HR 103     2 Minute HR 120     3 Minute HR 115     4 Minute HR 122     5 Minute HR 125     6 Minute HR 116     2 Minute Post HR 99     Interval Heart Rate? Yes       Interval Oxygen   Interval Oxygen? Yes     Baseline Oxygen Saturation % 98 %     1 Minute Oxygen Saturation % 96 %     2 Minute Oxygen Saturation % 98 %     3 Minute Oxygen Saturation % 98 %     4 Minute Oxygen Saturation % 98 %     5 Minute Oxygen Saturation % 98 %     6 Minute Oxygen Saturation % 98 %     2 Minute Post Oxygen Saturation % 97 %        Initial Exercise Prescription:     Initial Exercise Prescription - 09/30/16 1200      Date of Initial Exercise RX and Referring Provider   Date 09/30/16     Treadmill   MPH 1.4   Grade 0   Minutes 15   METs 2     NuStep   Level 2   Minutes 15   METs 2     Biostep-RELP   Level 2   Minutes 15   METs 2     Prescription Details   Frequency (times per week) 3   Duration Progress to 45 minutes of aerobic exercise without signs/symptoms of physical distress     Intensity   THRR 40-80% of Max Heartrate 93-120   Ratings of Perceived Exertion 11-13   Perceived Dyspnea 0-4     Resistance Training   Training Prescription Yes   Weight 3   Reps 10-12      Perform Capillary Blood Glucose checks as needed.  Exercise Prescription Changes:   Exercise Comments:   Discharge Exercise Prescription (Final Exercise Prescription Changes):    Nutrition:  Target Goals: Understanding of nutrition guidelines, daily intake of sodium 1500mg , cholesterol 200mg , calories 30% from fat and 7% or less from saturated fats, daily to have 5 or more servings of fruits and vegetables.  Biometrics:     Pre Biometrics - 09/30/16 1218      Pre Biometrics   Height 5\' 10"  (1.778 m)   Weight 213 lb  (96.6 kg)   Waist Circumference 46 inches   Hip Circumference 47 inches   Waist to Hip Ratio 0.98 %   BMI (Calculated) 30.6       Nutrition Therapy Plan and Nutrition Goals:   Nutrition Discharge: Rate Your Plate Scores:   Psychosocial: Target Goals: Acknowledge presence or absence of depression, maximize coping skills, provide positive support system. Participant is able to verbalize types and ability to use techniques and skills needed for reducing stress and depression.  Initial Review & Psychosocial Screening:     Initial Psych Review & Screening - 09/30/16 1217      Family Dynamics   Good Support System? Yes   Comments Mr Canepa has good family support from his wife and children. 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  He did loss his one son to kidney cancer in April, but does not seem to be grieving at this time.     Barriers   Psychosocial barriers to participate in program The patient should benefit from training in stress management and relaxation.     Screening Interventions   Interventions Encouraged to exercise;Program counselor consult      Quality of Life Scores:     Quality of Life - 09/30/16 1228      Quality of Life Scores   Health/Function Pre 20.19 %   Socioeconomic Pre 19.94 %   Psych/Spiritual Pre 20.25 %   Family Pre 20.14 %   GLOBAL Pre 21 %      PHQ-9: Recent Review Flowsheet Data    Depression screen [facility] 2/9 09/30/2016   Decreased Interest 0   Down, Depressed, Hopeless 0   PHQ - 2 Score 0   Altered sleeping 0   Tired, decreased energy 1   Change in appetite 0   Feeling bad or failure about yourself  0   Trouble concentrating 0   Moving slowly or fidgety/restless 0   Suicidal thoughts 0   PHQ-9 Score 1      Psychosocial Evaluation and Intervention:   Psychosocial Re-Evaluation:  Education: Education Goals: Education classes will be provided on a weekly basis, covering required  topics. Participant will state understanding/return demonstration of topics presented.  Learning Barriers/Preferences:     Learning Barriers/Preferences - 09/30/16 1208      Learning Barriers/Preferences   Learning Barriers None   Learning Preferences None      Education Topics: Initial Evaluation Education: - Verbal, written and demonstration of respiratory meds, RPE/PD scales, oximetry and breathing techniques. Instruction on use of nebulizers and MDIs: cleaning and proper use, rinsing mouth with steroid doses and importance of monitoring MDI activations. Flowsheet Row Pulmonary Rehab from 09/30/2016 in [facility] Cardiac and Pulmonary Rehab  Date  09/30/16  Educator  LB  Instruction Review Code  2- meets goals/outcomes      General Nutrition Guidelines/Fats and Fiber: -Group instruction provided by verbal, written material, models and posters to present the general guidelines for heart healthy nutrition. Gives an explanation and review of dietary fats and fiber.   Controlling Sodium/Reading Food Labels: -Group verbal and written material supporting the discussion of sodium use in heart healthy nutrition. Review and explanation with models, verbal and written materials for utilization of the food label.   Exercise Physiology & Risk Factors: - Group verbal and written instruction with models to review the exercise physiology of the cardiovascular system and associated critical values. Details cardiovascular disease risk factors and the goals associated with each risk factor.   Aerobic Exercise & Resistance Training: - Gives group verbal and written discussion on the health impact of inactivity. On the components of aerobic and resistive training programs and the benefits of this training and how to safely progress through these programs.   Flexibility, Balance, General Exercise Guidelines: - Provides group verbal and written instruction on the benefits of flexibility and balance  training programs. Provides general exercise guidelines with specific guidelines to those with heart or lung disease. Demonstration and skill practice provided.   Stress Management: - Provides group verbal and written instruction about the health risks of elevated stress, cause of high stress, and healthy ways to reduce stress.   Depression: - Provides group verbal and written instruction on the correlation between heart/lung disease and depressed mood, treatment options, and the stigmas associated with seeking  treatment.   Exercise & Equipment Safety: - Individual verbal instruction and demonstration of equipment use and safety with use of the equipment.   Infection Prevention: - Provides verbal and written material to individual with discussion of infection control including proper hand washing and proper equipment cleaning during exercise session.   Falls Prevention: - Provides verbal and written material to individual with discussion of falls prevention and safety.   Diabetes: - Individual verbal and written instruction to review signs/symptoms of diabetes, desired ranges of glucose level fasting, after meals and with exercise. Advice that pre and post exercise glucose checks will be done for 3 sessions at entry of program.   Chronic Lung Diseases: - Group verbal and written instruction to review new updates, new respiratory medications, new advancements in procedures and treatments. Provide informative websites and "800" numbers of self-education.   Lung Procedures: - Group verbal and written instruction to describe testing methods done to diagnose lung disease. Review the outcome of test results. Describe the treatment choices: Pulmonary Function Tests, ABGs and oximetry.   Energy Conservation: - Provide group verbal and written instruction for methods to conserve energy, plan and organize activities. Instruct on pacing techniques, use of adaptive equipment and  posture/positioning to relieve shortness of breath.   Triggers: - Group verbal and written instruction to review types of environmental controls: home humidity, furnaces, filters, dust mite/pet prevention, HEPA vacuums. To discuss weather changes, air quality and the benefits of nasal washing.   Exacerbations: - Group verbal and written instruction to provide: warning signs, infection symptoms, calling MD promptly, preventive modes, and value of vaccinations. Review: effective airway clearance, coughing and/or vibration techniques. Create an Sport and exercise psychologist.   Oxygen: - Individual and group verbal and written instruction on oxygen therapy. Includes supplement oxygen, available portable oxygen systems, continuous and intermittent flow rates, oxygen safety, concentrators, and Medicare reimbursement for oxygen.   Respiratory Medications: - Group verbal and written instruction to review medications for lung disease. Drug class, frequency, complications, importance of spacers, rinsing mouth after steroid MDI's, and proper cleaning methods for nebulizers. Flowsheet Row Pulmonary Rehab from 09/30/2016 in [facility] Cardiac and Pulmonary Rehab  Date  09/30/16  Educator  LB  Instruction Review Code  2- meets goals/outcomes      AED/CPR: - Group verbal and written instruction with the use of models to demonstrate the basic use of the AED with the basic ABC's of resuscitation.   Breathing Retraining: - Provides individuals verbal and written instruction on purpose, frequency, and proper technique of diaphragmatic breathing and pursed-lipped breathing. Applies individual practice skills.   Anatomy and Physiology of the Lungs: - Group verbal and written instruction with the use of models to provide basic lung anatomy and physiology related to function, structure and complications of lung disease.   Heart Failure: - Group verbal and written instruction on the basics of heart failure: signs/symptoms,  treatments, explanation of ejection fraction, enlarged heart and cardiomyopathy.   Sleep Apnea: - Individual verbal and written instruction to review Obstructive Sleep Apnea. Review of risk factors, methods for diagnosing and types of masks and machines for OSA.   Anxiety: - Provides group, verbal and written instruction on the correlation between heart/lung disease and anxiety, treatment options, and management of anxiety.   Relaxation: - Provides group, verbal and written instruction about the benefits of relaxation for patients with heart/lung disease. Also provides patients with examples of relaxation techniques.   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   Expected Outcomes Achievement of increased cardiorespiratory fitness and enhanced flexibility, muscular endurance and strength shown through measurements of functional capacity and personal statement of participant.   Increase Strength and Stamina Yes  Home TM and Exercise bike; uses 2-3lb weights 90 reps several days/week   Intervention Provide advice, education, support and counseling about physical activity/exercise needs.;Develop an individualized exercise prescription for aerobic and resistive training based on initial evaluation findings, risk stratification, comorbidities and participant's personal  goals.   Expected Outcomes Achievement of increased cardiorespiratory fitness and enhanced flexibility, muscular endurance and strength shown through measurements of functional capacity and personal statement of participant.   Develop more efficient breathing techniques such as purse lipped breathing and diaphragmatic breathing; and practicing self-pacing with activity Yes   Intervention Provide education, demonstration and support about specific breathing techniuqes utilized for more efficient breathing. Include techniques such as pursed lipped breathing, diaphragmatic breathing and self-pacing activity.   Expected Outcomes Short Term: Participant will be able to demonstrate and use breathing techniques as needed throughout daily activities.   Increase knowledge of respiratory medications and ability to use respiratory devices properly  Yes  ProAir with spacer; CPAP 12cmH2O, nasal mask   Intervention Provide education and demonstration as needed of appropriate use of medications, inhalers, and oxygen therapy.   Expected Outcomes Short Term: Achieves understanding of medications use. Understands that oxygen is a medication prescribed by physician. Demonstrates appropriate use of inhaler and oxygen therapy.   Lipids Yes   Intervention Provide education and support for participant on nutrition & aerobic/resistive exercise along with prescribed medications to achieve LDL 70mg , HDL >40mg .   Expected Outcomes Short Term: Participant states understanding of desired cholesterol values and is compliant with medications prescribed. Participant is following exercise prescription and nutrition guidelines.;Long Term: Cholesterol controlled with medications as prescribed, with individualized exercise RX and with personalized nutrition plan. Value goals: LDL < 70mg , HDL > 40 mg.      Core Components/Risk Factors/Patient Goals Review:    Core Components/Risk Factors/Patient Goals at Discharge (Final Review):     ITP Comments:     ITP Comments    Row Name 10/10/16 1106 10/20/16 0855         ITP Comments Returned call.  Arvel is scheduled for a stent removal on Tuesday.  He is cleared to return from cardiac and kidney standpoint.  Spoke with Johnny Bridge about making sure that Dr. Evelene Croon provides clearance for Channing Mutters to start rehab after his stent removal. Called Mr Mcquistion to check on his condition from his hospitalization. I spoke with Mrs Bosshardt who states Mr Curtsinger will have 2 weeks of antibiotics through his IV line and will have home nursing and PT therapy for several weeks. Mr Amado does want to return to [facility] after clearance from his physician.         Comments: 30 day note review

## 2016-10-29 ENCOUNTER — Ambulatory Visit: Payer: Medicare Other

## 2016-10-31 ENCOUNTER — Ambulatory Visit: Payer: Medicare Other

## 2016-11-03 ENCOUNTER — Ambulatory Visit: Payer: Medicare Other

## 2016-11-05 ENCOUNTER — Ambulatory Visit: Payer: Medicare Other

## 2016-11-07 ENCOUNTER — Ambulatory Visit: Payer: Medicare Other

## 2016-11-10 ENCOUNTER — Ambulatory Visit: Payer: Medicare Other

## 2016-11-12 ENCOUNTER — Ambulatory Visit: Payer: Medicare Other

## 2016-11-14 ENCOUNTER — Ambulatory Visit: Payer: Medicare Other

## 2016-11-17 ENCOUNTER — Ambulatory Visit: Payer: Medicare Other

## 2016-11-18 ENCOUNTER — Encounter: Payer: Self-pay | Admitting: Respiratory Therapy

## 2016-11-18 ENCOUNTER — Telehealth: Payer: Self-pay | Admitting: Internal Medicine

## 2016-11-18 ENCOUNTER — Telehealth: Payer: Self-pay | Admitting: Respiratory Therapy

## 2016-11-18 DIAGNOSIS — I5020 Unspecified systolic (congestive) heart failure: Secondary | ICD-10-CM

## 2016-11-18 DIAGNOSIS — J45909 Unspecified asthma, uncomplicated: Secondary | ICD-10-CM

## 2016-11-18 NOTE — Telephone Encounter (Signed)
New message  Pt wife call requesting to speak with RN. Pt wife states pt needed to be seen sooner than f/u scheduled for April. Pt wife states pt has been to the hospital as well. appt was made with amber on 2/23. Please call back to discuss

## 2016-11-18 NOTE — Telephone Encounter (Signed)
Called Douglas Washington - last attended 09/30/17 due to hospitalization. I spoke with Douglas Washington, and she states Douglas Washington is doing better - IV line removed, finished treatment from home RN's and PT's, and continuation of home exercises. He has an appointment 12/05/16 with Douglas Washington and will inquire for a clearance to return to [facility].

## 2016-11-18 NOTE — Telephone Encounter (Signed)
At last OV he was given permission to have kidney stone removed(had stent place).  Stent removed 10/14/16, tolerated procedure well.  After being home for 2 hours he was aching all over and his wife called EMS.  He was septic from UTI.  Stayed in the hospital 10/14/16-10/19/16.  He had a PIC line and was getting Vancomycin.  The Zenaida Niece was stopped on 10/30/17.  11/14/16 PIC line was d/c'd.  She just wanted him to see Amber or Dr Johney Frame to discuss all this as he has a Theatre manager.  TEE looked ok but she just is concerned and wants Amber or Dr Johney Frame to sit down with him and her for reassurance.  She was given an appointment on 12/05/16 and feels good with that.

## 2016-11-19 ENCOUNTER — Ambulatory Visit: Payer: Medicare Other

## 2016-11-21 ENCOUNTER — Ambulatory Visit: Payer: Medicare Other

## 2016-11-24 ENCOUNTER — Encounter: Payer: Self-pay | Admitting: Respiratory Therapy

## 2016-11-24 ENCOUNTER — Ambulatory Visit: Payer: Medicare Other

## 2016-11-24 DIAGNOSIS — J45909 Unspecified asthma, uncomplicated: Secondary | ICD-10-CM

## 2016-11-24 DIAGNOSIS — I5020 Unspecified systolic (congestive) heart failure: Secondary | ICD-10-CM

## 2016-11-24 NOTE — Progress Notes (Signed)
Pulmonary Individual Treatment Plan  Patient Details  Name: Douglas Washington MRN: [id number] Date of Birth: [birth year] Referring Provider:    Initial Encounter Date:  Flowsheet Row Pulmonary Rehab from 09/30/2016 in [facility] Cardiac and Pulmonary Rehab  Date  09/30/16      Visit Diagnosis: Systolic congestive heart failure, unspecified congestive heart failure chronicity (HCC)  Asthma, unspecified asthma severity, unspecified whether complicated, unspecified whether persistent  Patient's Home Medications on Admission:  Current Outpatient Prescriptions:    albuterol (PROVENTIL HFA;VENTOLIN HFA) 108 (90 BASE) MCG/ACT inhaler, Inhale 2 puffs into the lungs every 6 (six) hours as needed for wheezing or shortness of breath., Disp: , Rfl:    amiodarone (PACERONE) 200 MG tablet, Take 0.5 tablets (100 mg total) by mouth daily., Disp: 15 tablet, Rfl: 6   apixaban (ELIQUIS) 5 MG TABS tablet, Take 2 tabs (10mg  ) twice a day till 10/24/16 and then Take 1 tab (5 mg)  Twice a day, Disp: 60 tablet, Rfl: 1   brimonidine (ALPHAGAN) 0.2 % ophthalmic solution, Place 1 drop into the left eye 2 (two) times daily., Disp: , Rfl: 5   carvedilol (COREG) 3.125 MG tablet, Take 1 tablet (3.125 mg total) by mouth 2 (two) times daily with a meal., Disp: 180 tablet, Rfl: 3   cetirizine (ZYRTEC) 10 MG tablet, Take 10 mg by mouth daily as needed for allergies. , Disp: , Rfl:    clopidogrel (PLAVIX) 75 MG tablet, Take 75 mg by mouth daily., Disp: , Rfl:    docusate sodium (COLACE) 100 MG capsule, Take 2 capsules (200 mg total) by mouth 2 (two) times daily., Disp: 120 capsule, Rfl: 3   dorzolamide (TRUSOPT) 2 % ophthalmic solution, Place 1 drop into both eyes 2 (two) times daily., Disp: , Rfl:    erythromycin ophthalmic ointment, Place 1 application into both eyes at bedtime. , Disp: , Rfl: 3   Glucosamine-Chondroit-Vit C-Mn (GLUCOSAMINE 1500 COMPLEX) CAPS, Take 1 capsule by mouth daily., Disp: , Rfl:    latanoprost  (XALATAN) 0.005 % ophthalmic solution, Place 1 drop into both eyes at bedtime., Disp: , Rfl:    MEGARED OMEGA-3 KRILL OIL 500 MG CAPS, Take 500 mg by mouth daily. , Disp: , Rfl:    montelukast (SINGULAIR) 10 MG tablet, Take 10 mg by mouth at bedtime., Disp: , Rfl:    Multiple Vitamins-Minerals (MULTIVITAMIN WITH MINERALS) tablet, Take 1 tablet by mouth daily., Disp: , Rfl:    ondansetron (ZOFRAN ODT) 8 MG disintegrating tablet, Take 1 tablet (8 mg total) by mouth every 6 (six) hours as needed for nausea or vomiting. (Patient not taking: Reported on 10/09/2016), Disp: 10 tablet, Rfl: 3   timolol (TIMOPTIC) 0.5 % ophthalmic solution, Place 1 drop into the left eye 2 (two) times daily., Disp: , Rfl: 4   valACYclovir (VALTREX) 500 MG tablet, Take 500 mg by mouth daily., Disp: , Rfl:   Past Medical History: Past Medical History:  Diagnosis Date   Allergy    Asthma    CHF (congestive heart failure) (HCC)    10/17 WATCHMAN FILTER IMPLANTED   Chronic kidney disease    Kidney stones   COPD (chronic obstructive pulmonary disease) (HCC)    Dysrhythmia    WATCHMAN FILTER 10/17   Glaucoma (increased eye pressure)    Hearing loss    History of shingles 4 plus years ago   set in left eye   Hypertension    OSA on CPAP    Persistent atrial fibrillation (HCC)  06/05/2016   Presence of permanent cardiac pacemaker    2010   Renal insufficiency    Sebaceous cyst    Upper back   Shortness of breath dyspnea    with exertion   Sinus bradycardia    s/p MDT PPM    Traumatic brain injury (HCC) 01/2005   Ventricular tachycardia (HCC)    05/2016 requiring DCCV - started on amiodarone     Tobacco Use: History  Smoking Status   Former Smoker   Quit date: 04/14/1974  Smokeless Tobacco   Former User   Quit date: 04/14/1974    Labs: Recent Review Flowsheet Data    Labs for ITP Cardiac and Pulmonary Rehab Latest Ref Rng & Units 06/05/2016 06/05/2016 06/05/2016   Hemoglobin A1c  4.8 - 5.6 % - - 6.2(H)   HCO3 20.0 - 24.0 mEq/L 15.9(L) - -   TCO2 0 - 100 mmol/L 17 18 -   ACIDBASEDEF 0.0 - 2.0 mmol/L 10.0(H) - -   O2SAT % 94.0 - -       ADL UCSD:     Pulmonary Assessment Scores    Row Name 09/30/16 1209         ADL UCSD   ADL Phase Entry     SOB Score total 13     Rest 0     Walk 1     Stairs 2     Bath 0     Dress 1     Shop 1        Pulmonary Function Assessment:     Pulmonary Function Assessment - 09/30/16 1208      Pulmonary Function Tests   FVC% 65 %   FEV1% 79 %   FEV1/FVC Ratio 87.43     Initial Spirometry Results   Comments Test performed 09/30/16     Breath   Bilateral Breath Sounds Clear   Shortness of Breath No      Exercise Target Goals:    Exercise Program Goal: Individual exercise prescription set with THRR, safety & activity barriers. Participant demonstrates ability to understand and report RPE using BORG scale, to self-measure pulse accurately, and to acknowledge the importance of the exercise prescription.  Exercise Prescription Goal: Starting with aerobic activity 30 plus minutes a day, 3 days per week for initial exercise prescription. Provide home exercise prescription and guidelines that participant acknowledges understanding prior to discharge.  Activity Barriers & Risk Stratification:     Activity Barriers & Cardiac Risk Stratification - 09/30/16 1203      Activity Barriers & Cardiac Risk Stratification   Activity Barriers Other (comment)   Comments Pacemaker,  atrial fib   Cardiac Risk Stratification Moderate      6 Minute Walk:     6 Minute Walk    Row Name 09/30/16 1219         6 Minute Walk   Distance 730 feet     Walk Time 6 minutes     # of Rest Breaks 0     MPH 1.38     METS 2     RPE 13     Perceived Dyspnea  3     VO2 Peak 3.85     Symptoms No     Resting HR 64 bpm     Resting BP 118/60     Max Ex. HR 125 bpm     Max Ex. BP 122/60       Interval HR   Baseline HR 64  1 Minute HR 103     2 Minute HR 120     3 Minute HR 115     4 Minute HR 122     5 Minute HR 125     6 Minute HR 116     2 Minute Post HR 99     Interval Heart Rate? Yes       Interval Oxygen   Interval Oxygen? Yes     Baseline Oxygen Saturation % 98 %     1 Minute Oxygen Saturation % 96 %     2 Minute Oxygen Saturation % 98 %     3 Minute Oxygen Saturation % 98 %     4 Minute Oxygen Saturation % 98 %     5 Minute Oxygen Saturation % 98 %     6 Minute Oxygen Saturation % 98 %     2 Minute Post Oxygen Saturation % 97 %        Initial Exercise Prescription:     Initial Exercise Prescription - 09/30/16 1200      Date of Initial Exercise RX and Referring Provider   Date 09/30/16     Treadmill   MPH 1.4   Grade 0   Minutes 15   METs 2     NuStep   Level 2   Minutes 15   METs 2     Biostep-RELP   Level 2   Minutes 15   METs 2     Prescription Details   Frequency (times per week) 3   Duration Progress to 45 minutes of aerobic exercise without signs/symptoms of physical distress     Intensity   THRR 40-80% of Max Heartrate 93-120   Ratings of Perceived Exertion 11-13   Perceived Dyspnea 0-4     Resistance Training   Training Prescription Yes   Weight 3   Reps 10-12      Perform Capillary Blood Glucose checks as needed.  Exercise Prescription Changes:   Exercise Comments:   Discharge Exercise Prescription (Final Exercise Prescription Changes):    Nutrition:  Target Goals: Understanding of nutrition guidelines, daily intake of sodium 1500mg , cholesterol 200mg , calories 30% from fat and 7% or less from saturated fats, daily to have 5 or more servings of fruits and vegetables.  Biometrics:     Pre Biometrics - 09/30/16 1218      Pre Biometrics   Height 5\' 10"  (1.778 m)   Weight 213 lb (96.6 kg)   Waist Circumference 46 inches   Hip Circumference 47 inches   Waist to Hip Ratio 0.98 %   BMI (Calculated) 30.6       Nutrition Therapy Plan  and Nutrition Goals:   Nutrition Discharge: Rate Your Plate Scores:   Psychosocial: Target Goals: Acknowledge presence or absence of depression, maximize coping skills, provide positive support system. Participant is able to verbalize types and ability to use techniques and skills needed for reducing stress and depression.  Initial Review & Psychosocial Screening:     Initial Psych Review & Screening - 09/30/16 1217      Family Dynamics   Good Support System? Yes   Comments Douglas Sullenger has good family support from his wife and children. 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  He did loss his one son to kidney cancer in April, but does not seem to be grieving at this time.     Barriers   Psychosocial barriers  to participate in program The patient should benefit from training in stress management and relaxation.     Screening Interventions   Interventions Encouraged to exercise;Program counselor consult      Quality of Life Scores:     Quality of Life - 09/30/16 1228      Quality of Life Scores   Health/Function Pre 20.19 %   Socioeconomic Pre 19.94 %   Psych/Spiritual Pre 20.25 %   Family Pre 20.14 %   GLOBAL Pre 21 %      PHQ-9: Recent Review Flowsheet Data    Depression screen [facility] 2/9 09/30/2016   Decreased Interest 0   Down, Depressed, Hopeless 0   PHQ - 2 Score 0   Altered sleeping 0   Tired, decreased energy 1   Change in appetite 0   Feeling bad or failure about yourself  0   Trouble concentrating 0   Moving slowly or fidgety/restless 0   Suicidal thoughts 0   PHQ-9 Score 1      Psychosocial Evaluation and Intervention:   Psychosocial Re-Evaluation:  Education: Education Goals: Education classes will be provided on a weekly basis, covering required topics. Participant will state understanding/return demonstration of topics presented.  Learning Barriers/Preferences:     Learning Barriers/Preferences - 09/30/16  1208      Learning Barriers/Preferences   Learning Barriers None   Learning Preferences None      Education Topics: Initial Evaluation Education: - Verbal, written and demonstration of respiratory meds, RPE/PD scales, oximetry and breathing techniques. Instruction on use of nebulizers and MDIs: cleaning and proper use, rinsing mouth with steroid doses and importance of monitoring MDI activations. Flowsheet Row Pulmonary Rehab from 09/30/2016 in [facility] Cardiac and Pulmonary Rehab  Date  09/30/16  Educator  LB  Instruction Review Code  2- meets goals/outcomes      General Nutrition Guidelines/Fats and Fiber: -Group instruction provided by verbal, written material, models and posters to present the general guidelines for heart healthy nutrition. Gives an explanation and review of dietary fats and fiber.   Controlling Sodium/Reading Food Labels: -Group verbal and written material supporting the discussion of sodium use in heart healthy nutrition. Review and explanation with models, verbal and written materials for utilization of the food label.   Exercise Physiology & Risk Factors: - Group verbal and written instruction with models to review the exercise physiology of the cardiovascular system and associated critical values. Details cardiovascular disease risk factors and the goals associated with each risk factor.   Aerobic Exercise & Resistance Training: - Gives group verbal and written discussion on the health impact of inactivity. On the components of aerobic and resistive training programs and the benefits of this training and how to safely progress through these programs.   Flexibility, Balance, General Exercise Guidelines: - Provides group verbal and written instruction on the benefits of flexibility and balance training programs. Provides general exercise guidelines with specific guidelines to those with heart or lung disease. Demonstration and skill practice  provided.   Stress Management: - Provides group verbal and written instruction about the health risks of elevated stress, cause of high stress, and healthy ways to reduce stress.   Depression: - Provides group verbal and written instruction on the correlation between heart/lung disease and depressed mood, treatment options, and the stigmas associated with seeking treatment.   Exercise & Equipment Safety: - Individual verbal instruction and demonstration of equipment use and safety with use of the equipment.   Infection Prevention: - Provides verbal  and written material to individual with discussion of infection control including proper hand washing and proper equipment cleaning during exercise session.   Falls Prevention: - Provides verbal and written material to individual with discussion of falls prevention and safety.   Diabetes: - Individual verbal and written instruction to review signs/symptoms of diabetes, desired ranges of glucose level fasting, after meals and with exercise. Advice that pre and post exercise glucose checks will be done for 3 sessions at entry of program.   Chronic Lung Diseases: - Group verbal and written instruction to review new updates, new respiratory medications, new advancements in procedures and treatments. Provide informative websites and "800" numbers of self-education.   Lung Procedures: - Group verbal and written instruction to describe testing methods done to diagnose lung disease. Review the outcome of test results. Describe the treatment choices: Pulmonary Function Tests, ABGs and oximetry.   Energy Conservation: - Provide group verbal and written instruction for methods to conserve energy, plan and organize activities. Instruct on pacing techniques, use of adaptive equipment and posture/positioning to relieve shortness of breath.   Triggers: - Group verbal and written instruction to review types of environmental controls: home humidity,  furnaces, filters, dust mite/pet prevention, HEPA vacuums. To discuss weather changes, air quality and the benefits of nasal washing.   Exacerbations: - Group verbal and written instruction to provide: warning signs, infection symptoms, calling MD promptly, preventive modes, and value of vaccinations. Review: effective airway clearance, coughing and/or vibration techniques. Create an Sport and exercise psychologist.   Oxygen: - Individual and group verbal and written instruction on oxygen therapy. Includes supplement oxygen, available portable oxygen systems, continuous and intermittent flow rates, oxygen safety, concentrators, and Medicare reimbursement for oxygen.   Respiratory Medications: - Group verbal and written instruction to review medications for lung disease. Drug class, frequency, complications, importance of spacers, rinsing mouth after steroid MDI's, and proper cleaning methods for nebulizers. Flowsheet Row Pulmonary Rehab from 09/30/2016 in [facility] Cardiac and Pulmonary Rehab  Date  09/30/16  Educator  LB  Instruction Review Code  2- meets goals/outcomes      AED/CPR: - Group verbal and written instruction with the use of models to demonstrate the basic use of the AED with the basic ABC's of resuscitation.   Breathing Retraining: - Provides individuals verbal and written instruction on purpose, frequency, and proper technique of diaphragmatic breathing and pursed-lipped breathing. Applies individual practice skills.   Anatomy and Physiology of the Lungs: - Group verbal and written instruction with the use of models to provide basic lung anatomy and physiology related to function, structure and complications of lung disease.   Heart Failure: - Group verbal and written instruction on the basics of heart failure: signs/symptoms, treatments, explanation of ejection fraction, enlarged heart and cardiomyopathy.   Sleep Apnea: - Individual verbal and written instruction to review Obstructive  Sleep Apnea. Review of risk factors, methods for diagnosing and types of masks and machines for OSA.   Anxiety: - Provides group, verbal and written instruction on the correlation between heart/lung disease and anxiety, treatment options, and management of anxiety.   Relaxation: - Provides group, verbal and written instruction about the benefits of relaxation for patients with heart/lung disease. Also provides patients with examples of relaxation techniques.   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   Expected Outcomes Achievement of increased cardiorespiratory fitness and enhanced flexibility, muscular endurance and strength shown through measurements of functional capacity and personal statement of participant.   Increase Strength and Stamina Yes  Home TM and Exercise bike; uses 2-3lb weights 90 reps several days/week   Intervention Provide advice, education, support and counseling about physical activity/exercise needs.;Develop an individualized exercise prescription for aerobic and resistive training based on initial evaluation findings, risk stratification, comorbidities and participant's personal goals.   Expected Outcomes Achievement of increased cardiorespiratory fitness and enhanced flexibility, muscular endurance and strength shown through measurements of  functional capacity and personal statement of participant.   Develop more efficient breathing techniques such as purse lipped breathing and diaphragmatic breathing; and practicing self-pacing with activity Yes   Intervention Provide education, demonstration and support about specific breathing techniuqes utilized for more efficient breathing. Include techniques such as pursed lipped breathing, diaphragmatic breathing and self-pacing activity.   Expected Outcomes Short Term: Participant will be able to demonstrate and use breathing techniques as needed throughout daily activities.   Increase knowledge of respiratory medications and ability to use respiratory devices properly  Yes  ProAir with spacer; CPAP 12cmH2O, nasal mask   Intervention Provide education and demonstration as needed of appropriate use of medications, inhalers, and oxygen therapy.   Expected Outcomes Short Term: Achieves understanding of medications use. Understands that oxygen is a medication prescribed by physician. Demonstrates appropriate use of inhaler and oxygen therapy.   Lipids Yes   Intervention Provide education and support for participant on nutrition & aerobic/resistive exercise along with prescribed medications to achieve LDL 70mg , HDL >40mg .   Expected Outcomes Short Term: Participant states understanding of desired cholesterol values and is compliant with medications prescribed. Participant is following exercise prescription and nutrition guidelines.;Long Term: Cholesterol controlled with medications as prescribed, with individualized exercise RX and with personalized nutrition plan. Value goals: LDL < 70mg , HDL > 40 mg.      Core Components/Risk Factors/Patient Goals Review:    Core Components/Risk Factors/Patient Goals at Discharge (Final Review):    ITP Comments:     ITP Comments    Row Name 10/10/16 1106 10/20/16 0855 11/18/16 1333       ITP Comments Returned call.  Douglas Washington is scheduled for a stent removal  on Tuesday.  He is cleared to return from cardiac and kidney standpoint.  Spoke with Douglas Washington about making sure that Dr. Evelene Croon provides clearance for Douglas Washington to start rehab after his stent removal. Called Douglas Washington to check on his condition from his hospitalization. I spoke with Douglas Washington who states Douglas Washington will have 2 weeks of antibiotics through his IV line and will have home nursing and PT therapy for several weeks. Douglas Washington does want to return to [facility] after clearance from his physician. Called Douglas Washington - last attended 09/30/17 due to hospitalization. I spoke with Douglas Washington, and she states Douglas Washington is doing better - IV line removed, finished treatment from home RN's and PT's, and continuation of home exercises. He has an appointment 12/05/16 with Dr Johney Frame and will inquire for a clearance to  return to [facility].        Comments: 30 Day Note Review

## 2016-11-26 ENCOUNTER — Ambulatory Visit: Payer: Medicare Other

## 2016-11-28 ENCOUNTER — Ambulatory Visit: Payer: Medicare Other

## 2016-12-01 ENCOUNTER — Ambulatory Visit: Payer: Medicare Other

## 2016-12-03 ENCOUNTER — Ambulatory Visit: Payer: Medicare Other

## 2016-12-04 NOTE — Progress Notes (Signed)
Electrophysiology Follow Up Date: 12/05/2016  ID:  Douglas Washington, DOB [birth year], MRN [id number]  PCP: Rozanna Box, MD Primary Cardiologist: Laurena Bering Electrophysiologist: Allred  CC: follow-up from [facility] hospitalization   Douglas Washington is a 83 y.o. male seen today following hospitalization at [facility] for sepsis.  He was found to have DVT and PE at that time and was restarted on Eliquis. TEE during recent admission demonstrated no thrombus or vegetations with well seated Watchman device.  He has been slowly improving since discharge and asks about returning to pulmonary rehab today. He denies chest pain, shortness of breath, LE edema, recent fevers, chills, nausea or vomiting.   Device History: MDT dual chamber PPM implanted 2011 for sinus bradycardia   Past Medical History:  Diagnosis Date  . Allergy   . Asthma   . CHF (congestive heart failure) (HCC)    10/17 WATCHMAN FILTER IMPLANTED  . Chronic kidney disease    Kidney stones  . COPD (chronic obstructive pulmonary disease) (HCC)   . Dysrhythmia    WATCHMAN FILTER 10/17  . Glaucoma (increased eye pressure)   . Hearing loss   . History of shingles 4 plus years ago   set in left eye  . Hypertension   . OSA on CPAP   . Persistent atrial fibrillation (HCC) 06/05/2016  . Presence of permanent cardiac pacemaker    2010  . Renal insufficiency   . Sebaceous cyst    Upper back  . Shortness of breath dyspnea    with exertion  . Sinus bradycardia    s/p MDT PPM   . Traumatic brain injury (HCC) 01/2005  . Ventricular tachycardia (HCC)    05/2016 requiring DCCV - started on amiodarone    Past Surgical History:  Procedure Laterality Date  . APPENDECTOMY    . CARDIAC CATHETERIZATION N/A 06/06/2016   Procedure: Left Heart Cath and Coronary Angiography;  Surgeon: Kathleene Hazel, MD;  Location: [facility] INVASIVE CV LAB;  Service: Cardiovascular;  Laterality: N/A;  . CRANIOTOMY  03/2005  . CYSTOSCOPY W/ URETERAL STENT REMOVAL Left  10/14/2016   Procedure: CYSTOSCOPY WITH STENT REMOVAL;  Surgeon: Orson Ape, MD;  Location: [facility] ORS;  Service: Urology;  Laterality: Left;  . CYSTOSCOPY WITH STENT PLACEMENT Left 07/15/2016   Procedure: CYSTOSCOPY WITH STENT PLACEMENT;  Surgeon: Orson Ape, MD;  Location: [facility] ORS;  Service: Urology;  Laterality: Left;  . EXTRACORPOREAL SHOCK WAVE LITHOTRIPSY Left 10/02/2016   Procedure: EXTRACORPOREAL SHOCK WAVE LITHOTRIPSY (ESWL);  Surgeon: Orson Ape, MD;  Location: [facility] ORS;  Service: Urology;  Laterality: Left;  . INSERT / REPLACE / REMOVE PACEMAKER    . JOINT REPLACEMENT    . LEFT ATRIAL APPENDAGE OCCLUSION  07/31/2016  . LEFT ATRIAL APPENDAGE OCCLUSION N/A 07/31/2016   Procedure: LEFT ATRIAL APPENDAGE OCCLUSION;  Surgeon: [name], MD;  Location: [facility] INVASIVE CV LAB;  Service: Cardiovascular;  Laterality: N/A;  . PACEMAKER INSERTION  2011   MDT dual chamber PPM followed by [facility] implanted for sinus bradycardia  . SHOULDER ACROMIOPLASTY Bilateral   . TEE WITHOUT CARDIOVERSION N/A 07/21/2016   Procedure: TRANSESOPHAGEAL ECHOCARDIOGRAM (TEE);  Surgeon: Chilton Si, MD;  Location: [facility] ENDOSCOPY;  Service: Cardiovascular;  Laterality: N/A;  . TEE WITHOUT CARDIOVERSION N/A 09/12/2016   Procedure: TRANSESOPHAGEAL ECHOCARDIOGRAM (TEE);  Surgeon: Wendall Stade, MD;  Location: [facility] ENDOSCOPY;  Service: Cardiovascular;  Laterality: N/A;  . TOTAL HIP ARTHROPLASTY Left 2005  . URETEROSCOPY WITH HOLMIUM LASER LITHOTRIPSY Left  07/15/2016   Procedure: URETEROSCOPY WITH HOLMIUM LASER LITHOTRIPSY;  Surgeon: Orson Ape, MD;  Location: [facility] ORS;  Service: Urology;  Laterality: Left;    Current Outpatient Prescriptions  Medication Sig Dispense Refill  . albuterol (PROVENTIL HFA;VENTOLIN HFA) 108 (90 BASE) MCG/ACT inhaler Inhale 2 puffs into the lungs every 6 (six) hours as needed for wheezing or shortness of breath.    Marland Kitchen amiodarone (PACERONE) 200 MG tablet Take 0.5 tablets (100  mg total) by mouth daily. 15 tablet 6  . apixaban (ELIQUIS) 5 MG TABS tablet Take 2 tabs (10mg  ) twice a day till 10/24/16 and then Take 1 tab (5 mg)  Twice a day 60 tablet 1  . brimonidine (ALPHAGAN) 0.2 % ophthalmic solution Place 1 drop into the left eye 2 (two) times daily.  5  . carvedilol (COREG) 3.125 MG tablet Take 1 tablet (3.125 mg total) by mouth 2 (two) times daily with a meal. 180 tablet 3  . cetirizine (ZYRTEC) 10 MG tablet Take 10 mg by mouth daily as needed for allergies.     Marland Kitchen dorzolamide (TRUSOPT) 2 % ophthalmic solution Place 1 drop into both eyes 2 (two) times daily.    Marland Kitchen erythromycin ophthalmic ointment Place 1 application into both eyes at bedtime.   3  . Glucosamine-Chondroit-Vit C-Mn (GLUCOSAMINE 1500 COMPLEX) CAPS Take 1 capsule by mouth daily.    Marland Kitchen latanoprost (XALATAN) 0.005 % ophthalmic solution Place 1 drop into both eyes at bedtime.    Marland Kitchen MEGARED OMEGA-3 KRILL OIL 500 MG CAPS Take 500 mg by mouth daily.     . montelukast (SINGULAIR) 10 MG tablet Take 10 mg by mouth at bedtime.    . Multiple Vitamins-Minerals (MULTIVITAMIN WITH MINERALS) tablet Take 1 tablet by mouth daily.    . timolol (TIMOPTIC) 0.5 % ophthalmic solution Place 1 drop into the left eye 2 (two) times daily.  4  . valACYclovir (VALTREX) 500 MG tablet Take 500 mg by mouth daily.     No current facility-administered medications for this visit.     Allergies:   Penicillins; Adhesive [tape]; Ciprofloxacin; Oxycodone; Simvastatin; Statins; and Sulfa antibiotics   Social History: Social History   Social History  . Marital status: Married    Spouse name: N/A  . Number of children: N/A  . Years of education: N/A   Occupational History  . Retired    Social History Main Topics  . Smoking status: Former Smoker    Quit date: 04/14/1974  . Smokeless tobacco: Former Neurosurgeon    Quit date: 04/14/1974  . Alcohol use No  . Drug use: No  . Sexual activity: Not on file   Other Topics Concern  . Not on file     Social History Narrative  . No narrative on file    Family History: Family History  Problem Relation Age of Onset  . Diabetes Mother   . Heart disease Father   . Heart disease Brother      Review of Systems: All other systems reviewed and are otherwise negative except as noted above.   Physical Exam: VS:  BP 124/70   Pulse 78   Ht 5\' 10"  (1.778 m)   Wt 207 lb (93.9 kg)   SpO2 98%   BMI 29.70 kg/m  , BMI Body mass index is 29.7 kg/m.  GEN- The patient is elderly appearing, alert and oriented x 3 today.   HEENT: normocephalic, atraumatic; sclera clear, conjunctiva pink; hearing intact; oropharynx clear; neck supple Lungs-  Clear to ausculation bilaterally, normal work of breathing.  No wheezes, rales, rhonchi Heart- Regular rate and rhythm GI- soft, non-tender, non-distended, bowel sounds present Extremities- no clubbing, cyanosis, or edema; DP/PT/radial pulses 2+ bilaterally MS- no significant deformity or atrophy Skin- warm and dry, no rash or lesion; PPM pocket well healed Psych- euthymic mood, full affect Neuro- strength and sensation are intact, no focal neuro defects  PPM Interrogation- reviewed in detail today,  See PACEART report  EKG:  EKG is not ordered today.  Recent Labs: 06/05/2016: B Natriuretic Peptide 120.5; TSH 1.339 10/15/2016: ALT 13; BUN 26; Magnesium 1.8; Potassium 3.7; Sodium 137 10/18/2016: Hemoglobin 11.0; Platelets 205 10/19/2016: Creatinine, Ser 0.99   Wt Readings from Last 3 Encounters:  12/05/16 207 lb (93.9 kg)  10/19/16 204 lb 12.8 oz (92.9 kg)  10/14/16 203 lb (92.1 kg)     Other studies Reviewed: Additional studies/ records that were reviewed today include: hospital records   Assessment and Plan:  1.  Ventricular tachycardia 1 NSVT episode since last device interrogation Continue amiodarone 100mg  daily  TSH, LFT's to be followed by PCP   2. Symptomatic bradycardia Normal PPM function See Pace Art report No changes  today  3.  Persistent atrial fibrillation Maintaining SR Doing well s/p Watchman 6 week TEE showed well seated device with no leaks. From a stroke prevention standpoint for AF, he could be off OAC at this time and on Plavix/ASA With recent DVT/PE, he will continue Eliquis until 04/2017 per PCP's plan.  At that time, would start ASA 81mg  daily and he will not need long term OAC since Watchman is in place  4.  HTN Stable No change required today  5.  CAD Stable No change required today Intolerant of statins    Current medicines are reviewed at length with the patient today.   The patient does not have concerns regarding his medicines.  The following changes were made today:  none  Labs/ tests ordered today include: none Orders Placed This Encounter  Procedures  . CUP PACEART INCLINIC DEVICE CHECK     Disposition:   Follow up with me in 6 months per patient request   Signed, Gypsy Balsam, NP  12/05/2016 12:24 PM  [facility] [address] [place] [address] [phone] (office) [phone] (fax)

## 2016-12-05 ENCOUNTER — Ambulatory Visit (INDEPENDENT_AMBULATORY_CARE_PROVIDER_SITE_OTHER): Payer: Medicare Other | Admitting: Nurse Practitioner

## 2016-12-05 ENCOUNTER — Encounter: Payer: Self-pay | Admitting: Nurse Practitioner

## 2016-12-05 ENCOUNTER — Ambulatory Visit: Payer: Medicare Other

## 2016-12-05 VITALS — BP 124/70 | HR 78 | Ht 70.0 in | Wt 207.0 lb

## 2016-12-05 DIAGNOSIS — I10 Essential (primary) hypertension: Secondary | ICD-10-CM | POA: Diagnosis not present

## 2016-12-05 DIAGNOSIS — I472 Ventricular tachycardia, unspecified: Secondary | ICD-10-CM

## 2016-12-05 DIAGNOSIS — I4819 Other persistent atrial fibrillation: Secondary | ICD-10-CM

## 2016-12-05 DIAGNOSIS — R001 Bradycardia, unspecified: Secondary | ICD-10-CM | POA: Diagnosis not present

## 2016-12-05 DIAGNOSIS — I481 Persistent atrial fibrillation: Secondary | ICD-10-CM

## 2016-12-05 LAB — CUP PACEART INCLINIC DEVICE CHECK
Implantable Lead Implant Date: 20110318
Implantable Lead Location: 753859
Implantable Lead Model: 5076
Implantable Lead Model: 5076
Implantable Pulse Generator Implant Date: 20110318
MDC IDC LEAD IMPLANT DT: 20110318
MDC IDC LEAD LOCATION: 753860
MDC IDC SESS DTM: 20180223120842

## 2016-12-05 NOTE — Patient Instructions (Addendum)
Medication Instructions:   Your physician recommends that you continue on your current medications as directed. Please refer to the Current Medication list given to you today.   If you need a refill on your cardiac medications before your next appointment, please call your pharmacy.  Labwork: NONE ORDERED  TODAY    Testing/Procedures: NONE ORDERED  TODAY    Follow-Up:  Your physician wants you to follow-up in:  IN  6   MONTHS WITH AMBER   You will receive a reminder letter in the mail two months in advance. If you don't receive a letter, please call our office to schedule the follow-up appointment.      Any Other Special Instructions Will Be Listed Below (If Applicable).                                                                                                                                                   

## 2016-12-08 ENCOUNTER — Encounter: Payer: Medicare Other | Attending: Internal Medicine | Admitting: *Deleted

## 2016-12-08 ENCOUNTER — Ambulatory Visit: Payer: Medicare Other

## 2016-12-08 DIAGNOSIS — J45909 Unspecified asthma, uncomplicated: Secondary | ICD-10-CM | POA: Diagnosis present

## 2016-12-08 DIAGNOSIS — G4730 Sleep apnea, unspecified: Secondary | ICD-10-CM | POA: Diagnosis not present

## 2016-12-08 DIAGNOSIS — I5020 Unspecified systolic (congestive) heart failure: Secondary | ICD-10-CM | POA: Diagnosis not present

## 2016-12-08 NOTE — Progress Notes (Signed)
Daily Session Note  Patient Details  Name: Douglas Washington MRN: [id number] Date of Birth: Jan 21, 1929 Referring Provider:    Encounter Date: 12/08/2016  Check In:     Session Check In - 12/08/16 1109      Check-In   Location [facility]Card[facility] & Pul[facility] Re[facility]   Staff Present Nada Maclachlan, BA, ACSM CEP, Exercise Physiologist;Laureen Owens Shark, BS, RRT, Respiratory Bertis Ruddy, BS, ACSM CEP, Exercise Physiologist   Supervising physician immediately available to respond to emergencies [facility] immediately available ER MD   Physician(s) Jimmye Norman and Quentin Cornwall   Medication changes reported     No   Fall or balance concerns reported    No   Warm-up and Cool-down Performed as group-led instruction   Resistance Training Performed Yes   VAD Patient? No     Pain Assessment   Currently in Pain? No/denies   Multiple Pain Sites No           Exercise Prescription Changes - 12/08/16 1200      Response to Exercise   Blood Pressure (Admit) 120/72   Blood Pressure (Exercise) 148/64   Blood Pressure (Exit) 118/66   Heart Rate (Admit) 69 bpm   Heart Rate (Exercise) 107 bpm   Heart Rate (Exit) 60 bpm   Oxygen Saturation (Admit) 98 %   Oxygen Saturation (Exercise) 97 %   Oxygen Saturation (Exit) 95 %   Rating of Perceived Exertion (Exercise) 11   Perceived Dyspnea (Exercise) 1     Resistance Training   Training Prescription Yes   Weight 3     Treadmill   MPH 1.4   Grade 0   Minutes 15   METs 2     NuStep   Level 2   Minutes 15   METs 2     Biostep-RELP   Level 2   Minutes 15   METs 2      History  Smoking Status  . Former Smoker  . Quit date: 04/14/1974  Smokeless Tobacco  . Former Systems developer  . Quit date: 04/14/1974    Goals Met:  Proper associated with RPD/PD & O2 Sat Exercise tolerated well Personal goals reviewed Strength training completed today  Goals Unmet:  Not Applicable  Comments: First full day of exercise!  Patient was oriented to gym and equipment  including functions, settings, policies, and procedures.  Patient's individual exercise prescription and treatment plan were reviewed.  All starting workloads were established based on the results of the 6 minute walk test done at initial orientation visit.  The plan for exercise progression was also introduced and progression will be customized based on patient's performance and goals.    Dr. Emily Filbert is Medical Director for [place] and [facility].

## 2016-12-10 ENCOUNTER — Encounter: Payer: Medicare Other | Admitting: Respiratory Therapy

## 2016-12-10 ENCOUNTER — Ambulatory Visit: Payer: Medicare Other

## 2016-12-10 DIAGNOSIS — J45909 Unspecified asthma, uncomplicated: Secondary | ICD-10-CM

## 2016-12-10 DIAGNOSIS — I5020 Unspecified systolic (congestive) heart failure: Secondary | ICD-10-CM

## 2016-12-10 NOTE — Progress Notes (Signed)
Daily Session Note  Patient Details  Name: IHAN PAT MRN: [id number] Date of Birth: 01/13/1929 Referring Provider:    Encounter Date: 12/10/2016  Check In:     Session Check In - 12/10/16 1050      Check-In   Location [facility]   Staff Present Carson Myrtle, BS, RRT, Respiratory Lennie Hummer, MA, ACSM RCEP, Exercise Physiologist;Other   Supervising physician immediately available to respond to emergencies [facility] immediately available ER MD   Physician(s) Jimmye Norman and [place]   Medication changes reported     No   Fall or balance concerns reported    No   Warm-up and Cool-down Performed as group-led instruction   Resistance Training Performed Yes   VAD Patient? No     Pain Assessment   Currently in Pain? No/denies   Multiple Pain Sites No         History  Smoking Status   Former Smoker   Quit date: 04/14/1974  Smokeless Tobacco   Former Systems developer   Quit date: 04/14/1974    Goals Met:  Proper associated with RPD/PD & O2 Sat Independence with exercise equipment Using PLB without cueing & demonstrates good technique Exercise tolerated well Strength training completed today  Goals Unmet:  Not Applicable  Comments:  Pt able to follow exercise prescription today without complaint.  Will continue to monitor for progression. Discussed with patient about need for appropriate footwear and on getting fitted for proper tennis shoes. Mindy verbalized understanding and need for wearing appropriate footwear during exercise.    Dr. Emily Filbert is Medical Director for [place] and [facility].

## 2016-12-12 ENCOUNTER — Encounter: Payer: Medicare Other | Attending: Internal Medicine

## 2016-12-12 ENCOUNTER — Ambulatory Visit: Payer: Medicare Other

## 2016-12-12 DIAGNOSIS — I5020 Unspecified systolic (congestive) heart failure: Secondary | ICD-10-CM | POA: Insufficient documentation

## 2016-12-12 DIAGNOSIS — J45909 Unspecified asthma, uncomplicated: Secondary | ICD-10-CM | POA: Diagnosis not present

## 2016-12-12 DIAGNOSIS — G4730 Sleep apnea, unspecified: Secondary | ICD-10-CM | POA: Insufficient documentation

## 2016-12-12 NOTE — Progress Notes (Signed)
Daily Session Note  Patient Details  Name: Douglas Washington MRN: [id number] Date of Birth: [birth year] Referring Provider:    Encounter Date: 12/12/2016  Check In:     Session Check In - 12/12/16 1057      Check-In   Location [facility]   Staff Present Nada Maclachlan, BA, ACSM CEP, Exercise Physiologist;Jacklyne Baik RN BSN   Supervising physician immediately available to respond to emergencies [facility] immediately available ER MD   Physician(s) Drs. Lord & Schaevitz   Medication changes reported     No   Fall or balance concerns reported    No   Tobacco Cessation No Change   Warm-up and Cool-down Performed as group-led Location manager Performed Yes   VAD Patient? No     Pain Assessment   Currently in Pain? No/denies   Multiple Pain Sites No           Exercise Prescription Changes - 12/11/16 1100      Response to Exercise   Blood Pressure (Admit) 124/64   Blood Pressure (Exercise) 124/56   Blood Pressure (Exit) 106/60   Heart Rate (Admit) 83 bpm   Heart Rate (Exercise) 109 bpm   Heart Rate (Exit) 75 bpm   Oxygen Saturation (Admit) 100 %   Oxygen Saturation (Exercise) 99 %   Oxygen Saturation (Exit) 98 %   Rating of Perceived Exertion (Exercise) 13   Perceived Dyspnea (Exercise) 1   Duration Progress to 45 minutes of aerobic exercise without signs/symptoms of physical distress   Intensity THRR unchanged     Resistance Training   Training Prescription Yes   Weight 3     Interval Training   Interval Training No     Treadmill   MPH 1.7   Grade 0   Minutes 15   METs 2.3     Biostep-RELP   Level 4   Minutes 15   METs 2.2      History  Smoking Status  . Former Smoker  . Quit date: 04/14/1974  Smokeless Tobacco  . Former Systems developer  . Quit date: 04/14/1974    Goals Met:  Proper associated with RPD/PD & O2 Sat Exercise tolerated well No report of cardiac concerns or symptoms Strength training completed today  Goals  Unmet:  Not Applicable  Comments: Pt able to follow exercise prescription today without complaint.  Will continue to monitor for progression.    Dr. Emily Filbert is Medical Director for [place] and [facility].

## 2016-12-15 ENCOUNTER — Other Ambulatory Visit: Payer: Self-pay | Admitting: *Deleted

## 2016-12-15 ENCOUNTER — Encounter: Payer: Medicare Other | Admitting: *Deleted

## 2016-12-15 ENCOUNTER — Ambulatory Visit: Payer: Medicare Other

## 2016-12-15 DIAGNOSIS — I5020 Unspecified systolic (congestive) heart failure: Secondary | ICD-10-CM

## 2016-12-15 DIAGNOSIS — J45909 Unspecified asthma, uncomplicated: Secondary | ICD-10-CM | POA: Diagnosis not present

## 2016-12-15 MED ORDER — APIXABAN 5 MG PO TABS: ORAL_TABLET | ORAL | 4 refills | Status: DC

## 2016-12-15 NOTE — Telephone Encounter (Signed)
Received request for Eliquis 5mg ; pt Crea-0.99 on 10/19/16, Wt-93.9kg, age-9yrs, & last seen by Cardiology-Amber Glory Buff on 12/05/16. Refill sent as requested. Per [employer] note on 12/05/16-she states that with recent DVT/PE, he will continue Eliquis until 04/2017 per PCP's plan & at that time, would start ASA 81mg  daily and he will not need long term OAC since Watchman is in place.

## 2016-12-15 NOTE — Progress Notes (Signed)
Daily Session Note  Patient Details  Name: Douglas Washington MRN: [id number] Date of Birth: June 22, 1929 Referring Provider:    Encounter Date: 12/15/2016  Check In:     Session Check In - 12/15/16 1012      Check-In   Location [facility]   Staff Present Earlean Shawl, BS, ACSM CEP, Exercise Physiologist;Laureen Owens Shark, BS, RRT, Respiratory Dareen Piano, BA, ACSM CEP, Exercise Physiologist   Supervising physician immediately available to respond to emergencies [facility] immediately available ER MD   Physician(s) Corky Downs and Jimmye Norman   Medication changes reported     No   Fall or balance concerns reported    No   Tobacco Cessation No Change   Warm-up and Cool-down Performed as group-led instruction   Resistance Training Performed Yes   VAD Patient? No     Pain Assessment   Currently in Pain? No/denies   Multiple Pain Sites No         History  Smoking Status  . Former Smoker  . Quit date: 04/14/1974  Smokeless Tobacco  . Former Systems developer  . Quit date: 04/14/1974    Goals Met:  Proper associated with RPD/PD & O2 Sat Independence with exercise equipment Exercise tolerated well Strength training completed today  Goals Unmet:  Not Applicable  Comments: Pt able to follow exercise prescription today without complaint.  Will continue to monitor for progression.    Dr. Emily Filbert is Medical Director for [place] and [facility].

## 2016-12-17 ENCOUNTER — Ambulatory Visit: Payer: Medicare Other

## 2016-12-17 DIAGNOSIS — J45909 Unspecified asthma, uncomplicated: Secondary | ICD-10-CM | POA: Diagnosis not present

## 2016-12-17 DIAGNOSIS — I5020 Unspecified systolic (congestive) heart failure: Secondary | ICD-10-CM

## 2016-12-17 NOTE — Progress Notes (Signed)
Daily Session Note  Patient Details  Name: Douglas Washington MRN: [id number] Date of Birth: [birth year] Referring Provider:    Encounter Date: 12/17/2016  Check In:     Session Check In - 12/17/16 1227      Check-In   Location [facility]   Staff Present Alberteen Sam, MA, ACSM RCEP, Exercise Physiologist;Dellie Piasecki Oletta Darter, BA, ACSM CEP, Exercise Physiologist;Laureen Janell Quiet, RRT, Respiratory Therapist   Supervising physician immediately available to respond to emergencies [facility] immediately available ER MD   Physician(s) Corky Downs and Archie Balboa   Medication changes reported     No   Fall or balance concerns reported    No   Warm-up and Cool-down Performed as group-led instruction   Resistance Training Performed Yes   VAD Patient? No     Pain Assessment   Currently in Pain? No/denies   Multiple Pain Sites No         History  Smoking Status  . Former Smoker  . Quit date: 04/14/1974  Smokeless Tobacco  . Former Systems developer  . Quit date: 04/14/1974    Goals Met:  Proper associated with RPD/PD & O2 Sat Independence with exercise equipment Exercise tolerated well Strength training completed today  Goals Unmet:  Not Applicable  Comments: Reviewed home exercise with pt today.  Pt plans to walk for exercise.  Reviewed THR, pulse, RPE, sign and symptoms, NTG use, and when to call 911 or MD.  Also discussed weather considerations and indoor options.  Pt voiced understanding.    Dr. Emily Filbert is Medical Director for [place] and [facility].

## 2016-12-19 ENCOUNTER — Ambulatory Visit: Payer: Medicare Other

## 2016-12-19 ENCOUNTER — Encounter: Payer: Medicare Other | Admitting: *Deleted

## 2016-12-19 DIAGNOSIS — J45909 Unspecified asthma, uncomplicated: Secondary | ICD-10-CM

## 2016-12-19 DIAGNOSIS — I5020 Unspecified systolic (congestive) heart failure: Secondary | ICD-10-CM

## 2016-12-19 NOTE — Progress Notes (Signed)
Daily Session Note  Patient Details  Name: Douglas Washington MRN: [id number] Date of Birth: 02/17/1929 Referring Provider:    Encounter Date: 12/19/2016  Check In:     Session Check In - 12/19/16 1100      Check-In   Location [facility]   Staff Present Nada Maclachlan, BA, ACSM CEP, Exercise Physiologist;Other  Darel Hong, RN, BSN   Supervising physician immediately available to respond to emergencies [facility] immediately available ER MD   Physician(s) Drs. Quentin Cornwall & [place]   Medication changes reported     No   Fall or balance concerns reported    No   Tobacco Cessation No Change   Warm-up and Cool-down Performed as group-led Location manager Performed Yes   VAD Patient? No     Pain Assessment   Currently in Pain? No/denies   Multiple Pain Sites No         History  Smoking Status  . Former Smoker  . Quit date: 04/14/1974  Smokeless Tobacco  . Former Systems developer  . Quit date: 04/14/1974    Goals Met:  Proper associated with RPD/PD & O2 Sat Independence with exercise equipment Using PLB without cueing & demonstrates good technique Exercise tolerated well Strength training completed today  Goals Unmet:  Not Applicable  Comments: Pt able to follow exercise prescription today without complaint.  Will continue to monitor for progression.    Dr. Emily Filbert is Medical Director for [place] and [facility].

## 2016-12-22 ENCOUNTER — Ambulatory Visit: Payer: Medicare Other

## 2016-12-22 ENCOUNTER — Encounter: Payer: Self-pay | Admitting: Respiratory Therapy

## 2016-12-22 ENCOUNTER — Encounter: Payer: Medicare Other | Admitting: *Deleted

## 2016-12-22 DIAGNOSIS — J45909 Unspecified asthma, uncomplicated: Secondary | ICD-10-CM

## 2016-12-22 DIAGNOSIS — I5020 Unspecified systolic (congestive) heart failure: Secondary | ICD-10-CM

## 2016-12-22 NOTE — Progress Notes (Signed)
Pulmonary Individual Treatment Plan  Patient Details  Name: Douglas Washington MRN: [id number] Date of Birth: [birth year] Referring Provider:    Initial Encounter Date:  Flowsheet Row Pulmonary Rehab from 09/30/2016 in [facility] Cardiac and Pulmonary Rehab  Date  09/30/16      Visit Diagnosis: Systolic congestive heart failure, unspecified congestive heart failure chronicity ([place])  Asthma, unspecified asthma severity, unspecified whether complicated, unspecified whether persistent  Patient's Home Medications on Admission:  Current Outpatient Prescriptions:    albuterol (PROVENTIL HFA;VENTOLIN HFA) 108 (90 BASE) MCG/ACT inhaler, Inhale 2 puffs into the lungs every 6 (six) hours as needed for wheezing or shortness of breath., Disp: , Rfl:    amiodarone (PACERONE) 200 MG tablet, Take 0.5 tablets (100 mg total) by mouth daily., Disp: 15 tablet, Rfl: 6   apixaban (ELIQUIS) 5 MG TABS tablet, Take 1 tab (5 mg)  Twice a day, Disp: 60 tablet, Rfl: 4   brimonidine (ALPHAGAN) 0.2 % ophthalmic solution, Place 1 drop into the left eye 2 (two) times daily., Disp: , Rfl: 5   carvedilol (COREG) 3.125 MG tablet, Take 1 tablet (3.125 mg total) by mouth 2 (two) times daily with a meal., Disp: 180 tablet, Rfl: 3   cetirizine (ZYRTEC) 10 MG tablet, Take 10 mg by mouth daily as needed for allergies. , Disp: , Rfl:    dorzolamide (TRUSOPT) 2 % ophthalmic solution, Place 1 drop into both eyes 2 (two) times daily., Disp: , Rfl:    erythromycin ophthalmic ointment, Place 1 application into both eyes at bedtime. , Disp: , Rfl: 3   Glucosamine-Chondroit-Vit C-Mn (GLUCOSAMINE 1500 COMPLEX) CAPS, Take 1 capsule by mouth daily., Disp: , Rfl:    latanoprost (XALATAN) 0.005 % ophthalmic solution, Place 1 drop into both eyes at bedtime., Disp: , Rfl:    MEGARED OMEGA-3 KRILL OIL 500 MG CAPS, Take 500 mg by mouth daily. , Disp: , Rfl:    montelukast (SINGULAIR) 10 MG tablet, Take 10 mg by mouth at bedtime., Disp: , Rfl:     Multiple Vitamins-Minerals (MULTIVITAMIN WITH MINERALS) tablet, Take 1 tablet by mouth daily., Disp: , Rfl:    timolol (TIMOPTIC) 0.5 % ophthalmic solution, Place 1 drop into the left eye 2 (two) times daily., Disp: , Rfl: 4   valACYclovir (VALTREX) 500 MG tablet, Take 500 mg by mouth daily., Disp: , Rfl:   Past Medical History: Past Medical History:  Diagnosis Date   Allergy    Asthma    CHF (congestive heart failure) ([place])    10/17 WATCHMAN FILTER IMPLANTED   Chronic kidney disease    Kidney stones   COPD (chronic obstructive pulmonary disease) ([place])    Dysrhythmia    WATCHMAN FILTER 10/17   Glaucoma (increased eye pressure)    Hearing loss    History of shingles 4 plus years ago   set in left eye   Hypertension    OSA on CPAP    Persistent atrial fibrillation ([place]) 06/05/2016   Presence of permanent cardiac pacemaker    2010   Renal insufficiency    Sebaceous cyst    Upper back   Shortness of breath dyspnea    with exertion   Sinus bradycardia    s/p MDT PPM    Traumatic brain injury ([place]) 01/2005   Ventricular tachycardia ([place])    05/2016 requiring DCCV - started on amiodarone     Tobacco Use: History  Smoking Status   Former Smoker   Quit date: 04/14/1974  Smokeless Tobacco  Former Systems developer   Quit date: 04/14/1974    Labs: Recent Review [facility] for ITP Cardiac and Pulmonary Rehab Latest Ref Rng & Units 06/05/2016 06/05/2016 06/05/2016   Hemoglobin A1c 4.8 - 5.6 % - - 6.2(H)   HCO3 20.0 - 24.0 mEq/L 15.9(L) - -   TCO2 0 - 100 mmol/L 17 18 -   ACIDBASEDEF 0.0 - 2.0 mmol/L 10.0(H) - -   O2SAT % 94.0 - -       ADL UCSD:     Pulmonary Assessment Scores    Row Name 09/30/16 1209         ADL UCSD   ADL Phase Entry     SOB Score total 13     Rest 0     Walk 1     Stairs 2     Bath 0     Dress 1     Shop 1        Pulmonary Function Assessment:     Pulmonary Function Assessment - 09/30/16 1208       Pulmonary Function Tests   FVC% 65 %   FEV1% 79 %   FEV1/FVC Ratio 87.43     Initial Spirometry Results   Comments Test performed 09/30/16     Breath   Bilateral Breath Sounds Clear   Shortness of Breath No      Exercise Target Goals:    Exercise Program Goal: Individual exercise prescription set with THRR, safety & activity barriers. Participant demonstrates ability to understand and report RPE using BORG scale, to self-measure pulse accurately, and to acknowledge the importance of the exercise prescription.  Exercise Prescription Goal: Starting with aerobic activity 30 plus minutes a day, 3 days per week for initial exercise prescription. Provide home exercise prescription and guidelines that participant acknowledges understanding prior to discharge.  Activity Barriers & Risk Stratification:     Activity Barriers & Cardiac Risk Stratification - 09/30/16 1203      Activity Barriers & Cardiac Risk Stratification   Activity Barriers Other (comment)   Comments Pacemaker,  atrial fib   Cardiac Risk Stratification Moderate      6 Minute Walk:     6 Minute Walk    Row Name 09/30/16 1219         6 Minute Walk   Distance 730 feet     Walk Time 6 minutes     # of Rest Breaks 0     MPH 1.38     METS 2     RPE 13     Perceived Dyspnea  3     VO2 Peak 3.85     Symptoms No     Resting HR 64 bpm     Resting BP 118/60     Max Ex. HR 125 bpm     Max Ex. BP 122/60       Interval HR   Baseline HR 64     1 Minute HR 103     2 Minute HR 120     3 Minute HR 115     4 Minute HR 122     5 Minute HR 125     6 Minute HR 116     2 Minute Post HR 99     Interval Heart Rate? Yes       Interval Oxygen   Interval Oxygen? Yes     Baseline Oxygen Saturation % 98 %     1 Minute  Oxygen Saturation % 96 %     2 Minute Oxygen Saturation % 98 %     3 Minute Oxygen Saturation % 98 %     4 Minute Oxygen Saturation % 98 %     5 Minute Oxygen Saturation % 98 %     6 Minute Oxygen  Saturation % 98 %     2 Minute Post Oxygen Saturation % 97 %       Oxygen Initial Assessment:   Oxygen Re-Evaluation:   Oxygen Discharge (Final Oxygen Re-Evaluation):   Initial Exercise Prescription:     Initial Exercise Prescription - 09/30/16 1200      Date of Initial Exercise RX and Referring Provider   Date 09/30/16     Treadmill   MPH 1.4   Grade 0   Minutes 15   METs 2     NuStep   Level 2   Minutes 15   METs 2     Biostep-RELP   Level 2   Minutes 15   METs 2     Prescription Details   Frequency (times per week) 3   Duration Progress to 45 minutes of aerobic exercise without signs/symptoms of physical distress     Intensity   THRR 40-80% of Max Heartrate 93-120   Ratings of Perceived Exertion 11-13   Perceived Dyspnea 0-4     Resistance Training   Training Prescription Yes   Weight 3   Reps 10-12      Perform Capillary Blood Glucose checks as needed.  Exercise Prescription Changes:     Exercise Prescription Changes    Row Name 12/08/16 1200 12/11/16 1100           Response to Exercise   Blood Pressure (Admit) 120/72 124/64      Blood Pressure (Exercise) 148/64 124/56      Blood Pressure (Exit) 118/66 106/60      Heart Rate (Admit) 69 bpm 83 bpm      Heart Rate (Exercise) 107 bpm 109 bpm      Heart Rate (Exit) 60 bpm 75 bpm      Oxygen Saturation (Admit) 98 % 100 %      Oxygen Saturation (Exercise) 97 % 99 %      Oxygen Saturation (Exit) 95 % 98 %      Rating of Perceived Exertion (Exercise) 11 13      Perceived Dyspnea (Exercise) 1 1      Duration  -- Progress to 45 minutes of aerobic exercise without signs/symptoms of physical distress      Intensity  -- THRR unchanged        Resistance Training   Training Prescription Yes Yes      Weight 3 3        Interval Training   Interval Training  -- No        Treadmill   MPH 1.4 1.7      Grade 0 0      Minutes 15 15      METs 2 2.3        NuStep   Level 2  --      Minutes 15   --      METs 2  --        Biostep-RELP   Level 2 4      Minutes 15 15      METs 2 2.2         Exercise Comments:  Exercise Comments    Row Name 12/08/16 1226 12/10/16 1120 12/11/16 1141 12/17/16 1230     Exercise Comments First full day of exercise!  Patient was oriented to gym and equipment including functions, settings, policies, and procedures.  Patient's individual exercise prescription and treatment plan were reviewed.  All starting workloads were established based on the results of the 6 minute walk test done at initial orientation visit.  The plan for exercise progression was also introduced and progression will be customized based on patient's performance and goals Discussed with Carloyn Manner about need for appropriate footwear and on getting fitted for proper tennis shoes. French verbalized understanding about this need to wear appropriate footwear for exercise.  Ojas is tolerating exercise very well in his first week. : Reviewed home exercise with pt today.  Pt plans to use weights and golf for exercise.  Reviewed THR, pulse, RPE, sign and symptoms, NTG use, and when to call 911 or MD.  Also discussed weather considerations and indoor options.  Pt voiced understanding.       Exercise Goals and Review:   Exercise Goals Re-Evaluation :   Discharge Exercise Prescription (Final Exercise Prescription Changes):     Exercise Prescription Changes - 12/11/16 1100      Response to Exercise   Blood Pressure (Admit) 124/64   Blood Pressure (Exercise) 124/56   Blood Pressure (Exit) 106/60   Heart Rate (Admit) 83 bpm   Heart Rate (Exercise) 109 bpm   Heart Rate (Exit) 75 bpm   Oxygen Saturation (Admit) 100 %   Oxygen Saturation (Exercise) 99 %   Oxygen Saturation (Exit) 98 %   Rating of Perceived Exertion (Exercise) 13   Perceived Dyspnea (Exercise) 1   Duration Progress to 45 minutes of aerobic exercise without signs/symptoms of physical distress   Intensity THRR unchanged      Resistance Training   Training Prescription Yes   Weight 3     Interval Training   Interval Training No     Treadmill   MPH 1.7   Grade 0   Minutes 15   METs 2.3     Biostep-RELP   Level 4   Minutes 15   METs 2.2      Nutrition:  Target Goals: Understanding of nutrition guidelines, daily intake of sodium <1539m, cholesterol <2089m calories 30% from fat and 7% or less from saturated fats, daily to have 5 or more servings of fruits and vegetables.  Biometrics:     Pre Biometrics - 09/30/16 1218      Pre Biometrics   Height _0  (1.778 m)   Weight 213 lb (96.6 kg)   Waist Circumference 46 inches   Hip Circumference 47 inches   Waist to Hip Ratio 0.98 %   BMI (Calculated) 30.6       Nutrition Therapy Plan and Nutrition Goals:   Nutrition Discharge: Rate Your Plate Scores:   Nutrition Goals Re-Evaluation:   Nutrition Goals Discharge (Final Nutrition Goals Re-Evaluation):   Psychosocial: Target Goals: Acknowledge presence or absence of significant depression and/or stress, maximize coping skills, provide positive support system. Participant is able to verbalize types and ability to use techniques and skills needed for reducing stress and depression.   Initial Review & Psychosocial Screening:     Initial Psych Review & Screening - 09/30/16 [place]   Comments Mr CoQuachas good family support from his wife and children. 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  He did loss his one son to kidney cancer in April, but does not seem to be grieving at this time.     Barriers   Psychosocial barriers to participate in program The patient should benefit from training in stress management and relaxation.     Screening Interventions   Interventions Encouraged to exercise;Program counselor consult      Quality of Life Scores:     Quality of Life - 09/30/16 1228      Quality of  Life Scores   Health/Function Pre 20.19 %   Socioeconomic Pre 19.94 %   Psych/Spiritual Pre 20.25 %   Family Pre 20.14 %   GLOBAL Pre 21 %      PHQ-9: Recent Review Flowsheet Data    Depression screen [facility] 2/9 09/30/2016   Decreased Interest 0   Down, Depressed, Hopeless 0   PHQ - 2 Score 0   Altered sleeping 0   Tired, decreased energy 1   Change in appetite 0   Feeling bad or failure about yourself  0   Trouble concentrating 0   Moving slowly or fidgety/restless 0   Suicidal thoughts 0   PHQ-9 Score 1     Interpretation of Total Score  Total Score Depression Severity:  1-4 = Minimal depression, 5-9 = Mild depression, 10-14 = Moderate depression, 15-19 = Moderately severe depression, 20-27 = Severe depression   Psychosocial Evaluation and Intervention:     Psychosocial Evaluation - 12/08/16 1111      Psychosocial Evaluation & Interventions   Interventions Encouraged to exercise with the program and follow exercise prescription;Stress management education   Comments Counselor met with Mr. Waskey Carloyn Manner) today for initial psychosocial evaluation.  He is an 83 year old who, in addition to a diagnosis of COPD, had a heart attack last August and then had subsequent surgeries for his heart and to remove a kidney stone through October, 2017.  He then became Sepsis in January following the removal of a stent for his kidneys.  so he has been through quite a lot in the past 6-9 months.  He has a strong support system with a spouse of almost 92 years and a daughter and son-in-law who live close by.  Adrin has another son in the [place] area and a son who died last spring due to kidney cancer.  Keng reports sleeping well and having a good appetite.  He denies a history of depression or anxiety or any current symptoms.  He states his primary stress currently is realizing he is unable to do the things he used to do.  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  He is realizing his limitations more due to his health and this is hard to accept.  Although Christpher states he is typically in a positive mood and is grateful to "be alive."  Teondre has goals to be better educated on his diagnoses and improve his quality of life and breathing.  Staff will be following with him throughout the course of this program.     Expected Outcomes Yates is expected to exercise consistently; improve his breathing and be better educated on his diagnoses and realistic expectations for him as a result.     Continue Psychosocial Services  Follow up required by staff      Psychosocial Re-Evaluation:   Psychosocial Discharge (Final  Psychosocial Re-Evaluation):   Education: Education Goals: Education classes will be provided on a weekly basis, covering required topics. Participant will state understanding/return demonstration of topics presented.  Learning Barriers/Preferences:     Learning Barriers/Preferences - 09/30/16 1208      Learning Barriers/Preferences   Learning Barriers None   Learning Preferences None      Education Topics: Initial Evaluation Education: - Verbal, written and demonstration of respiratory meds, RPE/PD scales, oximetry and breathing techniques. Instruction on use of nebulizers and MDIs: cleaning and proper use, rinsing mouth with steroid doses and importance of monitoring MDI activations. Flowsheet Row Pulmonary Rehab from 12/17/2016 in [facility] Cardiac and Pulmonary Rehab  Date  09/30/16  Educator  LB  Instruction Review Code  2- meets goals/outcomes      General Nutrition Guidelines/Fats and Fiber: -Group instruction provided by verbal, written material, models and posters to present the general guidelines for heart healthy nutrition. Gives an explanation and review of dietary fats and fiber. Flowsheet Row Pulmonary Rehab from 12/17/2016 in [facility] Cardiac and Pulmonary Rehab  Date  12/15/16  Educator  CR  Instruction  Review Code  2- meets goals/outcomes      Controlling Sodium/Reading Food Labels: -Group verbal and written material supporting the discussion of sodium use in heart healthy nutrition. Review and explanation with models, verbal and written materials for utilization of the food label.   Exercise Physiology & Risk Factors: - Group verbal and written instruction with models to review the exercise physiology of the cardiovascular system and associated critical values. Details cardiovascular disease risk factors and the goals associated with each risk factor.   Aerobic Exercise & Resistance Training: - Gives group verbal and written discussion on the health impact of inactivity. On the components of aerobic and resistive training programs and the benefits of this training and how to safely progress through these programs.   Flexibility, Balance, General Exercise Guidelines: - Provides group verbal and written instruction on the benefits of flexibility and balance training programs. Provides general exercise guidelines with specific guidelines to those with heart or lung disease. Demonstration and skill practice provided.   Stress Management: - Provides group verbal and written instruction about the health risks of elevated stress, cause of high stress, and healthy ways to reduce stress.   Depression: - Provides group verbal and written instruction on the correlation between heart/lung disease and depressed mood, treatment options, and the stigmas associated with seeking treatment. Flowsheet Row Pulmonary Rehab from 12/17/2016 in [facility] Cardiac and Pulmonary Rehab  Date  12/17/16  Educator  [facility]  Instruction Review Code  2- meets goals/outcomes      Exercise & Equipment Safety: - Individual verbal instruction and demonstration of equipment use and safety with use of the equipment. Flowsheet Row Pulmonary Rehab from 12/17/2016 in [facility] Cardiac and Pulmonary Rehab  Date  12/08/16  Educator  AS   Instruction Review Code  2- meets goals/outcomes      Infection Prevention: - Provides verbal and written material to individual with discussion of infection control including proper hand washing and proper equipment cleaning during exercise session. Flowsheet Row Pulmonary Rehab from 12/17/2016 in [facility] Cardiac and Pulmonary Rehab  Date  12/08/16  Educator  AS  Instruction Review Code  2- meets goals/outcomes      Falls Prevention: - Provides verbal and written material to individual with discussion of falls prevention and safety. Flowsheet Row Pulmonary Rehab from 12/17/2016 in [facility] Cardiac and Pulmonary Rehab  Date  12/08/16  Educator  AS  Instruction Review Code  2- meets goals/outcomes      Diabetes: - Individual verbal and written instruction to review signs/symptoms of diabetes, desired ranges of glucose level fasting, after meals and with exercise. Advice that pre and post exercise glucose checks will be done for 3 sessions at entry of program.   Chronic Lung Diseases: - Group verbal and written instruction to review new updates, new respiratory medications, new advancements in procedures and treatments. Provide informative websites and "800" numbers of self-education.   Lung Procedures: - Group verbal and written instruction to describe testing methods done to diagnose lung disease. Review the outcome of test results. Describe the treatment choices: Pulmonary Function Tests, ABGs and oximetry.   Energy Conservation: - Provide group verbal and written instruction for methods to conserve energy, plan and organize activities. Instruct on pacing techniques, use of adaptive equipment and posture/positioning to relieve shortness of breath. Flowsheet Row Pulmonary Rehab from 12/17/2016 in [facility] Cardiac and Pulmonary Rehab  Date  12/10/16  Educator  [facility]  Instruction Review Code  2- meets goals/outcomes      Triggers: - Group verbal and written instruction to review types of  environmental controls: home humidity, furnaces, filters, dust mite/pet prevention, HEPA vacuums. To discuss weather changes, air quality and the benefits of nasal washing.   Exacerbations: - Group verbal and written instruction to provide: warning signs, infection symptoms, calling MD promptly, preventive modes, and value of vaccinations. Review: effective airway clearance, coughing and/or vibration techniques. Create an Sports administrator.   Oxygen: - Individual and group verbal and written instruction on oxygen therapy. Includes supplement oxygen, available portable oxygen systems, continuous and intermittent flow rates, oxygen safety, concentrators, and Medicare reimbursement for oxygen.   Respiratory Medications: - Group verbal and written instruction to review medications for lung disease. Drug class, frequency, complications, importance of spacers, rinsing mouth after steroid MDI's, and proper cleaning methods for nebulizers. Flowsheet Row Pulmonary Rehab from 12/17/2016 in [facility] Cardiac and Pulmonary Rehab  Date  09/30/16  Educator  LB  Instruction Review Code  2- meets goals/outcomes      AED/CPR: - Group verbal and written instruction with the use of models to demonstrate the basic use of the AED with the basic ABC's of resuscitation.   Breathing Retraining: - Provides individuals verbal and written instruction on purpose, frequency, and proper technique of diaphragmatic breathing and pursed-lipped breathing. Applies individual practice skills.   Anatomy and Physiology of the Lungs: - Group verbal and written instruction with the use of models to provide basic lung anatomy and physiology related to function, structure and complications of lung disease.   Heart Failure: - Group verbal and written instruction on the basics of heart failure: signs/symptoms, treatments, explanation of ejection fraction, enlarged heart and cardiomyopathy.   Sleep Apnea: - Individual verbal and written  instruction to review Obstructive Sleep Apnea. Review of risk factors, methods for diagnosing and types of masks and machines for OSA.   Anxiety: - Provides group, verbal and written instruction on the correlation between heart/lung disease and anxiety, treatment options, and management of anxiety.   Relaxation: - Provides group, verbal and written instruction about the benefits of relaxation for patients with heart/lung disease. Also provides patients with examples of relaxation techniques.   Knowledge Questionnaire Score:     Knowledge Questionnaire Score - 09/30/16 1208      Knowledge Questionnaire Score   Pre Score 5/10       Core Components/Risk Factors/Patient Goals at Admission:  Personal Goals and Risk Factors at Admission - 09/30/16 1213      Core Components/Risk Factors/Patient Goals on Admission   Sedentary Yes  Walks on farm at least 9mns/day   Intervention Provide advice, education, support and counseling about physical activity/exercise needs.;Develop an individualized exercise prescription for aerobic and resistive training based on initial evaluation findings, risk stratification, comorbidities and participant's personal goals.   Expected Outcomes Achievement of increased cardiorespiratory fitness and enhanced flexibility, muscular endurance and strength shown through measurements of functional capacity and personal statement of participant.   Increase Strength and Stamina Yes  Home TM and Exercise bike; uses 2-3lb weights 90 reps several days/week   Intervention Provide advice, education, support and counseling about physical activity/exercise needs.;Develop an individualized exercise prescription for aerobic and resistive training based on initial evaluation findings, risk stratification, comorbidities and participant's personal goals.   Expected Outcomes Achievement of increased cardiorespiratory fitness and enhanced flexibility, muscular endurance and  strength shown through measurements of functional capacity and personal statement of participant.   Develop more efficient breathing techniques such as purse lipped breathing and diaphragmatic breathing; and practicing self-pacing with activity Yes   Intervention Provide education, demonstration and support about specific breathing techniuqes utilized for more efficient breathing. Include techniques such as pursed lipped breathing, diaphragmatic breathing and self-pacing activity.   Expected Outcomes Short Term: Participant will be able to demonstrate and use breathing techniques as needed throughout daily activities.   Increase knowledge of respiratory medications and ability to use respiratory devices properly  Yes  ProAir with spacer; CPAP 12cmH2O, nasal mask   Intervention Provide education and demonstration as needed of appropriate use of medications, inhalers, and oxygen therapy.   Expected Outcomes Short Term: Achieves understanding of medications use. Understands that oxygen is a medication prescribed by physician. Demonstrates appropriate use of inhaler and oxygen therapy.   Lipids Yes   Intervention Provide education and support for participant on nutrition & aerobic/resistive exercise along with prescribed medications to achieve LDL <773m HDL >4063m  Expected Outcomes Short Term: Participant states understanding of desired cholesterol values and is compliant with medications prescribed. Participant is following exercise prescription and nutrition guidelines.;Long Term: Cholesterol controlled with medications as prescribed, with individualized exercise RX and with personalized nutrition plan. Value goals: LDL < 58m56mDL > 40 mg.      Core Components/Risk Factors/Patient Goals Review:      Goals and Risk Factor Review    Row Name 12/08/16 1229 12/17/16 1535           Core Components/Risk Factors/Patient Goals Review   Personal Goals Review Develop more efficient breathing  techniques such as purse lipped breathing and diaphragmatic breathing and practicing self-pacing with activity. Weight Management/Obesity;Improve shortness of breath with ADL's;Develop more efficient breathing techniques such as purse lipped breathing and diaphragmatic breathing and practicing self-pacing with activity.;Increase knowledge of respiratory medications and ability to use respiratory devices properly.;Lipids      Review PLB was demonstrated and discussed with patient. He demonstrated understanding of how to use PLB during exercise and daily activities to help control SOB.  Mr CobbMoncus completed 5 sessions of [facility] and has progressed his exercise goals. He uses his CPAP every night and has a good understanding of the importance of this therapy. I gave him a spacer for his ProAir and will instruct him on it's use in the next class. He does not use his ProAir frequently. He has good technique with his PLB and is qued to use the  technique. Mr Tooker is enjooying the socilaization.      Expected Outcomes Patient will use PLB during exercise and daily activties to help control SOB and maintain oxygen saturations.  Continue to progress with his exercise goals and use PLB with exercise and with home activities.         Core Components/Risk Factors/Patient Goals at Discharge (Final Review):      Goals and Risk Factor Review - 12/17/16 1535      Core Components/Risk Factors/Patient Goals Review   Personal Goals Review Weight Management/Obesity;Improve shortness of breath with ADL's;Develop more efficient breathing techniques such as purse lipped breathing and diaphragmatic breathing and practicing self-pacing with activity.;Increase knowledge of respiratory medications and ability to use respiratory devices properly.;Lipids   Review Mr Kistler has completed 5 sessions of [facility] and has progressed his exercise goals. He uses his CPAP every night and has a good understanding of the importance of this  therapy. I gave him a spacer for his ProAir and will instruct him on it's use in the next class. He does not use his ProAir frequently. He has good technique with his PLB and is qued to use the technique. Mr Gencarelli is enjooying the socilaization.   Expected Outcomes Continue to progress with his exercise goals and use PLB with exercise and with home activities.      ITP Comments:     ITP Comments    Row Name 10/10/16 1106 10/20/16 0855 11/18/16 1333 12/08/16 1532     ITP Comments Returned call.  Kel is scheduled for a stent removal on Tuesday.  He is cleared to return from cardiac and kidney standpoint.  Spoke with Jana Half about making sure that Dr. Yves Dill provides clearance for Carloyn Manner to start rehab after his stent removal. Called Mr Schmale to check on his condition from his hospitalization. I spoke with Mrs Wissner who states Mr Luppino will have 2 weeks of antibiotics through his IV line and will have home nursing and PT therapy for several weeks. Mr Holzheimer does want to return to [place] after clearance from his physician. Called Mr Stannard - last attended 09/30/17 due to hospitalization. I spoke with Ms Shear, and she states Mr Housholder is doing better - IV line removed, finished treatment from home RN's and PT's, and continuation of home exercises. He has an appointment 12/05/16 with Dr Rayann Heman and will inquire for a clearance to return to [place]. Mr Largo brought in clearance to return to [place].        Comments: 30 Day Note Review

## 2016-12-22 NOTE — Progress Notes (Signed)
Daily Session Note  Patient Details  Name: Douglas Washington MRN: [id number] Date of Birth: [birth year] Referring Provider:    Encounter Date: 12/22/2016  Check In:     Session Check In - 12/22/16 1104      Check-In   Location [facility]   Staff Present Carson Myrtle, BS, RRT, Respiratory Therapist;Iley Deignan Amedeo Plenty, BS, ACSM CEP, Exercise Physiologist   Supervising physician immediately available to respond to emergencies See telemetry face sheet for immediately available ER MD   Physician(s) Rifenbark and Paduchowski   Medication changes reported     No   Fall or balance concerns reported    No   Warm-up and Cool-down Performed as group-led instruction   Resistance Training Performed Yes   VAD Patient? No     Pain Assessment   Currently in Pain? No/denies   Multiple Pain Sites No         History  Smoking Status  . Former Smoker  . Quit date: 04/14/1974  Smokeless Tobacco  . Former Systems developer  . Quit date: 04/14/1974    Goals Met:  Proper associated with RPD/PD & O2 Sat Independence with exercise equipment Exercise tolerated well Strength training completed today  Goals Unmet:  Not Applicable  Comments: Pt able to follow exercise prescription today without complaint.  Will continue to monitor for progression.    Dr. Emily Filbert is Medical Director for [place] and [facility].

## 2016-12-24 ENCOUNTER — Ambulatory Visit: Payer: Medicare Other

## 2016-12-24 DIAGNOSIS — I5020 Unspecified systolic (congestive) heart failure: Secondary | ICD-10-CM

## 2016-12-24 DIAGNOSIS — J45909 Unspecified asthma, uncomplicated: Secondary | ICD-10-CM

## 2016-12-24 NOTE — Progress Notes (Signed)
Daily Session Note  Patient Details  Name: Douglas Washington MRN: [id number] Date of Birth: 1929/09/19 Referring Provider:    Encounter Date: 12/24/2016  Check In:     Session Check In - 12/24/16 1206      Check-In   Location [facility]   Staff Present Carson Myrtle, BS, RRT, Respiratory Dareen Piano, BA, ACSM CEP, Exercise Physiologist;Other  Darel Hong RN   Supervising physician immediately available to respond to emergencies [facility] immediately available ER MD   Physician(s) Marcelene Butte and Darl Householder   Medication changes reported     No   Fall or balance concerns reported    No   Warm-up and Cool-down Performed as group-led instruction   Resistance Training Performed Yes   VAD Patient? No     Pain Assessment   Currently in Pain? No/denies         History  Smoking Status  . Former Smoker  . Quit date: 04/14/1974  Smokeless Tobacco  . Former Systems developer  . Quit date: 04/14/1974    Goals Met:  Proper associated with RPD/PD & O2 Sat Independence with exercise equipment Exercise tolerated well Strength training completed today  Goals Unmet:  Not Applicable  Comments: Pt able to follow exercise prescription today without complaint.  Will continue to monitor for progression.    Dr. Emily Filbert is Medical Director for [place] and [facility].

## 2016-12-26 ENCOUNTER — Ambulatory Visit: Payer: Medicare Other

## 2016-12-26 DIAGNOSIS — I5020 Unspecified systolic (congestive) heart failure: Secondary | ICD-10-CM

## 2016-12-26 DIAGNOSIS — J45909 Unspecified asthma, uncomplicated: Secondary | ICD-10-CM | POA: Diagnosis not present

## 2016-12-26 NOTE — Progress Notes (Signed)
Daily Session Note  Patient Details  Name: LEIB ELAHI MRN: [id number] Date of Birth: [birth year] Referring Provider:    Encounter Date: 12/26/2016  Check In:     Session Check In - 12/26/16 1045      Check-In   Location [facility]   Staff Present Nada Maclachlan, BA, ACSM CEP, Exercise Physiologist;Boniface Goffe RN BSN   Supervising physician immediately available to respond to emergencies [facility] immediately available ER MD   Physician(s) Drs. Lord and [employer]   Medication changes reported     No   Fall or balance concerns reported    No   Tobacco Cessation No Change   Warm-up and Cool-down Performed as group-led Location manager Performed Yes   VAD Patient? No     Pain Assessment   Currently in Pain? No/denies   Multiple Pain Sites No         History  Smoking Status  . Former Smoker  . Quit date: 04/14/1974  Smokeless Tobacco  . Former Systems developer  . Quit date: 04/14/1974    Goals Met:  Proper associated with RPD/PD & O2 Sat Independence with exercise equipment Exercise tolerated well Queuing for purse lip breathing Strength training completed today  Goals Unmet:  Not Applicable  Comments: Pt able to follow exercise prescription today without complaint.  Will continue to monitor for progression.    Dr. Emily Filbert is Medical Director for [place] and [facility].

## 2016-12-29 ENCOUNTER — Ambulatory Visit: Payer: Medicare Other

## 2016-12-29 ENCOUNTER — Encounter: Payer: Medicare Other | Admitting: *Deleted

## 2016-12-29 DIAGNOSIS — J45909 Unspecified asthma, uncomplicated: Secondary | ICD-10-CM

## 2016-12-29 DIAGNOSIS — I5020 Unspecified systolic (congestive) heart failure: Secondary | ICD-10-CM

## 2016-12-29 NOTE — Progress Notes (Signed)
Daily Session Note  Patient Details  Name: Douglas Washington MRN: [id number] Date of Birth: Oct 26, 1928 Referring Provider:    Encounter Date: 12/29/2016  Check In:     Session Check In - 12/29/16 1013      Check-In   Location [facility]   Staff Present Earlean Shawl, BS, ACSM CEP, Exercise Physiologist;Laureen Owens Shark, BS, RRT, Respiratory Dareen Piano, BA, ACSM CEP, Exercise Physiologist   Supervising physician immediately available to respond to emergencies [facility] immediately available ER MD   Physician(s) Reita Cliche and Alfred Levins   Medication changes reported     No   Fall or balance concerns reported    No   Warm-up and Cool-down Performed as group-led Location manager Performed Yes   VAD Patient? No     Pain Assessment   Currently in Pain? No/denies   Multiple Pain Sites No         History  Smoking Status  . Former Smoker  . Quit date: 04/14/1974  Smokeless Tobacco  . Former Systems developer  . Quit date: 04/14/1974    Goals Met:  Proper associated with RPD/PD & O2 Sat Independence with exercise equipment Exercise tolerated well Strength training completed today  Goals Unmet:  Not Applicable  Comments: Pt able to follow exercise prescription today without complaint.  Will continue to monitor for progression.    Dr. Emily Filbert is Medical Director for [place] and [facility].

## 2016-12-31 ENCOUNTER — Ambulatory Visit: Payer: Medicare Other

## 2016-12-31 DIAGNOSIS — J45909 Unspecified asthma, uncomplicated: Secondary | ICD-10-CM | POA: Diagnosis not present

## 2016-12-31 DIAGNOSIS — I5020 Unspecified systolic (congestive) heart failure: Secondary | ICD-10-CM

## 2016-12-31 NOTE — Progress Notes (Signed)
Daily Session Note  Patient Details  Name: Douglas Washington MRN: [id number] Date of Birth: Apr 18, 1929 Referring Provider:    Encounter Date: 12/31/2016  Check In:     Session Check In - 12/31/16 1037      Check-In   Location [facility]   Staff Present Carson Myrtle, BS, RRT, Respiratory Lennie Hummer, MA, ACSM RCEP, Exercise Physiologist;Amanda Oletta Darter, BA, ACSM CEP, Exercise Physiologist   Supervising physician immediately available to respond to emergencies [facility] immediately available ER MD   Physician(s) Kerman Passey and Jimmye Norman   Medication changes reported     No   Fall or balance concerns reported    No   Warm-up and Cool-down Performed as group-led instruction   Resistance Training Performed Yes         History  Smoking Status  . Former Smoker  . Quit date: 04/14/1974  Smokeless Tobacco  . Former Systems developer  . Quit date: 04/14/1974    Goals Met:  Proper associated with RPD/PD & O2 Sat Independence with exercise equipment Exercise tolerated well Strength training completed today  Goals Unmet:  Not Applicable  Comments: Pt able to follow exercise prescription today without complaint.  Will continue to monitor for progression.    Dr. Emily Filbert is Medical Director for [place] and [facility].

## 2017-01-02 ENCOUNTER — Encounter: Payer: Medicare Other | Admitting: *Deleted

## 2017-01-02 ENCOUNTER — Ambulatory Visit: Payer: Medicare Other

## 2017-01-02 DIAGNOSIS — I5020 Unspecified systolic (congestive) heart failure: Secondary | ICD-10-CM

## 2017-01-02 DIAGNOSIS — J45909 Unspecified asthma, uncomplicated: Secondary | ICD-10-CM | POA: Diagnosis not present

## 2017-01-02 NOTE — Progress Notes (Signed)
Daily Session Note  Patient Details  Name: Douglas Washington MRN: [id number] Date of Birth: 1929-02-05 Referring Provider:    Encounter Date: 01/02/2017  Check In:     Session Check In - 01/02/17 1051      Check-In   Location [facility]   Staff Present Nyoka Cowden, RN, BSN, [place]den Field, BS, RRT, Respiratory Therapist;Other  Darel Hong, RN BSN   Supervising physician immediately available to respond to emergencies [facility] immediately available ER MD   Physician(s) Drs. Alfred Levins and [place]   Medication changes reported     No   Fall or balance concerns reported    No   Tobacco Cessation No Change   Warm-up and Cool-down Performed as group-led Location manager Performed Yes   VAD Patient? No     Pain Assessment   Currently in Pain? No/denies   Multiple Pain Sites No         History  Smoking Status  . Former Smoker  . Quit date: 04/14/1974  Smokeless Tobacco  . Former Systems developer  . Quit date: 04/14/1974    Goals Met:  Proper associated with RPD/PD & O2 Sat Independence with exercise equipment Using PLB without cueing & demonstrates good technique Exercise tolerated well Strength training completed today  Goals Unmet:  Not Applicable  Comments: Pt able to follow exercise prescription today without complaint.  Will continue to monitor for progression.    Dr. Emily Filbert is Medical Director for [place] and [facility].

## 2017-01-05 ENCOUNTER — Ambulatory Visit: Payer: Medicare Other

## 2017-01-05 ENCOUNTER — Encounter: Payer: Medicare Other | Admitting: *Deleted

## 2017-01-05 DIAGNOSIS — I5020 Unspecified systolic (congestive) heart failure: Secondary | ICD-10-CM

## 2017-01-05 DIAGNOSIS — J45909 Unspecified asthma, uncomplicated: Secondary | ICD-10-CM

## 2017-01-05 NOTE — Progress Notes (Signed)
Daily Session Note  Patient Details  Name: Douglas Washington MRN: [id number] Date of Birth: [birth year] Referring Provider:    Encounter Date: 01/05/2017  Check In:     Session Check In - 01/05/17 1018      Check-In   Location [facility]   Staff Present Carson Myrtle, BS, RRT, Respiratory Dareen Piano, BA, ACSM CEP, Exercise Physiologist;Zyon Grout Amedeo Plenty, BS, ACSM CEP, Exercise Physiologist   Supervising physician immediately available to respond to emergencies [facility] immediately available ER MD   Physician(s) Clearnce Hasten and Jimmye Norman   Medication changes reported     No   Fall or balance concerns reported    No   Warm-up and Cool-down Performed as group-led instruction   Resistance Training Performed Yes   VAD Patient? No     Pain Assessment   Currently in Pain? No/denies   Multiple Pain Sites No         History  Smoking Status  . Former Smoker  . Quit date: 04/14/1974  Smokeless Tobacco  . Former Systems developer  . Quit date: 04/14/1974    Goals Met:  Proper associated with RPD/PD & O2 Sat Independence with exercise equipment Exercise tolerated well Strength training completed today  Goals Unmet:  Not Applicable  Comments: Pt able to follow exercise prescription today without complaint.  Will continue to monitor for progression.    Dr. Emily Filbert is Medical Director for [place] and [facility].

## 2017-01-07 ENCOUNTER — Ambulatory Visit: Payer: Medicare Other

## 2017-01-07 DIAGNOSIS — J45909 Unspecified asthma, uncomplicated: Secondary | ICD-10-CM

## 2017-01-07 DIAGNOSIS — I5020 Unspecified systolic (congestive) heart failure: Secondary | ICD-10-CM

## 2017-01-07 NOTE — Progress Notes (Signed)
Daily Session Note  Patient Details  Name: Douglas Washington MRN: [id number] Date of Birth: 02-06-1929 Referring Provider:    Encounter Date: 01/07/2017  Check In:     Session Check In - 01/07/17 1158      Check-In   Location [facility]   Staff Present Carson Myrtle, BS, RRT, Respiratory Lennie Hummer, MA, ACSM RCEP, Exercise Physiologist;Asuzena Weis Oletta Darter, BA, ACSM CEP, Exercise Physiologist   Supervising physician immediately available to respond to emergencies [facility] immediately available ER MD   Physician(s) Kerman Passey and Jimmye Norman   Medication changes reported     No   Fall or balance concerns reported    No   Warm-up and Cool-down Performed as group-led instruction   Resistance Training Performed Yes   VAD Patient? No     Pain Assessment   Currently in Pain? No/denies           Exercise Prescription Changes - 01/07/17 1100      Response to Exercise   Blood Pressure (Admit) 130/62   Blood Pressure (Exercise) 124/64   Blood Pressure (Exit) 126/70   Heart Rate (Admit) 66 bpm   Heart Rate (Exercise) 119 bpm   Heart Rate (Exit) 68 bpm   Oxygen Saturation (Admit) 97 %   Oxygen Saturation (Exercise) 96 %   Oxygen Saturation (Exit) 98 %   Rating of Perceived Exertion (Exercise) 13   Perceived Dyspnea (Exercise) 2   Duration Continue with 45 min of aerobic exercise without signs/symptoms of physical distress.   Intensity THRR unchanged     Progression   Progression Continue to progress workloads to maintain intensity without signs/symptoms of physical distress.     Resistance Training   Training Prescription Yes   Weight 3     Treadmill   MPH 2   Grade 0   Minutes 15   METs 2.53     NuStep   Level 5   Minutes 15   METs 3.3     Biostep-RELP   Level 5   Minutes 15   METs 3      History  Smoking Status  . Former Smoker  . Quit date: 04/14/1974  Smokeless Tobacco  . Former Systems developer  . Quit date: 04/14/1974    Goals Met:   Proper associated with RPD/PD & O2 Sat Independence with exercise equipment Exercise tolerated well Strength training completed today  Goals Unmet:  Not Applicable  Comments: Pt able to follow exercise prescription today without complaint.  Will continue to monitor for progression.    Dr. Emily Filbert is Medical Director for [place] and [facility].

## 2017-01-09 ENCOUNTER — Ambulatory Visit: Payer: Medicare Other

## 2017-01-09 DIAGNOSIS — I5020 Unspecified systolic (congestive) heart failure: Secondary | ICD-10-CM

## 2017-01-09 DIAGNOSIS — J45909 Unspecified asthma, uncomplicated: Secondary | ICD-10-CM | POA: Diagnosis not present

## 2017-01-09 NOTE — Progress Notes (Signed)
Daily Session Note  Patient Details  Name: RAJIV PARLATO MRN: [id number] Date of Birth: 07-05-1929 Referring Provider:    Encounter Date: 01/09/2017  Check In:     Session Check In - 01/09/17 1029      Check-In   Location [facility]   Staff Present Nyoka Cowden, RN, BSN, Kela Millin, BA, ACSM CEP, Exercise Physiologist   Supervising physician immediately available to respond to emergencies [facility] immediately available ER MD   Physician(s) Drs. Malinda and Lord   Medication changes reported     No   Fall or balance concerns reported    No   Warm-up and Cool-down Performed as group-led Location manager Performed Yes   VAD Patient? No     Pain Assessment   Currently in Pain? No/denies   Multiple Pain Sites No         History  Smoking Status  . Former Smoker  . Quit date: 04/14/1974  Smokeless Tobacco  . Former Systems developer  . Quit date: 04/14/1974    Goals Met:  Proper associated with RPD/PD & O2 Sat Independence with exercise equipment Exercise tolerated well Strength training completed today  Goals Unmet:  Not Applicable  Comments: Pt able to follow exercise prescription today without complaint.  Will continue to monitor for progression.    Dr. Emily Filbert is Medical Director for [place] and [facility].

## 2017-01-12 ENCOUNTER — Ambulatory Visit: Payer: Medicare Other

## 2017-01-12 ENCOUNTER — Encounter: Payer: Medicare Other | Attending: Internal Medicine | Admitting: *Deleted

## 2017-01-12 DIAGNOSIS — J45909 Unspecified asthma, uncomplicated: Secondary | ICD-10-CM | POA: Insufficient documentation

## 2017-01-12 DIAGNOSIS — G4730 Sleep apnea, unspecified: Secondary | ICD-10-CM | POA: Diagnosis not present

## 2017-01-12 DIAGNOSIS — I5020 Unspecified systolic (congestive) heart failure: Secondary | ICD-10-CM | POA: Diagnosis not present

## 2017-01-12 NOTE — Progress Notes (Signed)
Daily Session Note  Patient Details  Name: Douglas Washington MRN: [id number] Date of Birth: 09/09/1929 Referring Provider:    Encounter Date: 01/12/2017  Check In:     Session Check In - 01/12/17 1205      Check-In   Location [facility]   Staff Present Earlean Shawl, BS, ACSM CEP, Exercise Physiologist;Laureen Owens Shark, BS, RRT, Respiratory Dareen Piano, BA, ACSM CEP, Exercise Physiologist   Supervising physician immediately available to respond to emergencies [facility] immediately available ER MD   Physician(s) Quentin Cornwall and Jimmye Norman    Medication changes reported     No   Fall or balance concerns reported    No   Warm-up and Cool-down Performed as group-led instruction   Resistance Training Performed Yes   VAD Patient? No     Pain Assessment   Currently in Pain? No/denies   Multiple Pain Sites No         History  Smoking Status  . Former Smoker  . Quit date: 04/14/1974  Smokeless Tobacco  . Former Systems developer  . Quit date: 04/14/1974    Goals Met:  Proper associated with RPD/PD & O2 Sat Independence with exercise equipment Exercise tolerated well Strength training completed today  Goals Unmet:  Not Applicable  Comments: Pt able to follow exercise prescription today without complaint.  Will continue to monitor for progression.    Dr. Emily Filbert is Medical Director for [place] and [facility].

## 2017-01-14 ENCOUNTER — Ambulatory Visit: Payer: Medicare Other

## 2017-01-16 ENCOUNTER — Ambulatory Visit: Payer: Medicare Other

## 2017-01-19 ENCOUNTER — Encounter: Payer: Self-pay | Admitting: Respiratory Therapy

## 2017-01-19 ENCOUNTER — Ambulatory Visit: Payer: Medicare Other

## 2017-01-19 ENCOUNTER — Encounter: Payer: Medicare Other | Admitting: *Deleted

## 2017-01-19 DIAGNOSIS — J45909 Unspecified asthma, uncomplicated: Secondary | ICD-10-CM | POA: Diagnosis not present

## 2017-01-19 DIAGNOSIS — I5020 Unspecified systolic (congestive) heart failure: Secondary | ICD-10-CM

## 2017-01-19 NOTE — Progress Notes (Signed)
Daily Session Note  Patient Details  Name: BERND CROM MRN: [id number] Date of Birth: 04-26-1929 Referring Provider:    Encounter Date: 01/19/2017  Check In:     Session Check In - 01/19/17 1222      Check-In   Location [facility]   Staff Present Earlean Shawl, BS, ACSM CEP, Exercise Physiologist;Laureen Owens Shark, BS, RRT, Respiratory Dareen Piano, BA, ACSM CEP, Exercise Physiologist   Supervising physician immediately available to respond to emergencies [facility] immediately available ER MD   Physician(s) Corky Downs and Jimmye Norman    Medication changes reported     No   Fall or balance concerns reported    No   Warm-up and Cool-down Performed as group-led instruction   Resistance Training Performed Yes   VAD Patient? No     Pain Assessment   Currently in Pain? No/denies   Multiple Pain Sites No         History  Smoking Status  . Former Smoker  . Quit date: 04/14/1974  Smokeless Tobacco  . Former Systems developer  . Quit date: 04/14/1974    Goals Met:  Proper associated with RPD/PD & O2 Sat Independence with exercise equipment Exercise tolerated well Personal goals reviewed Strength training completed today  Goals Unmet:  Not Applicable  Comments: Pt able to follow exercise prescription today without complaint.  Will continue to monitor for progression.  Patient did his mid 6 min walk assessment today. See data sheet for detailed report. Results and progression were discussed with the patient.       [place] Name 09/30/16 1219 01/19/17 1223       6 Minute Walk   Phase  - Mid Program    Distance 730 feet 1061 feet    Distance % Change  - 45 %    Walk Time 6 minutes 6 minutes    # of Rest Breaks 0 0    MPH 1.38 2    METS 2 2.53    RPE 13 12    Perceived Dyspnea  3 2    VO2 Peak 3.85 6.47    Symptoms No No    Resting HR 64 bpm 77 bpm    Resting BP 118/60 120/70    Max Ex. HR 125 bpm 118 bpm    Max Ex. BP 122/60 150/82      Interval HR   Baseline HR 64 77    1 Minute HR 103 105    2 Minute HR 120 117    3 Minute HR 115 115    4 Minute HR 122 117    5 Minute HR 125 116    6 Minute HR 116 118    2 Minute Post HR 99 90    Interval Heart Rate? Yes Yes      Interval Oxygen   Interval Oxygen? Yes Yes    Baseline Oxygen Saturation % 98 % 98 %    1 Minute Oxygen Saturation % 96 % 96 %    2 Minute Oxygen Saturation % 98 % 98 %    3 Minute Oxygen Saturation % 98 % 97 %    4 Minute Oxygen Saturation % 98 % 97 %    5 Minute Oxygen Saturation % 98 % 98 %    6 Minute Oxygen Saturation % 98 % 98 %    2 Minute Post Oxygen Saturation % 97 % 99 %  Dr. Emily Filbert is Medical Director for [place] and [facility].

## 2017-01-19 NOTE — Progress Notes (Signed)
Pulmonary Individual Treatment Plan  Patient Details  Name: Douglas Washington MRN: [id number] Date of Birth: [birth year] Referring Provider:    Initial Encounter Date:    Pulmonary Rehab from 09/30/2016 in [facility] Cardiac and Pulmonary Rehab  Date  09/30/16      Visit Diagnosis: Systolic congestive heart failure, unspecified congestive heart failure chronicity ([place])  Asthma, unspecified asthma severity, unspecified whether complicated, unspecified whether persistent  Patient's Home Medications on Admission:  Current Outpatient Prescriptions:    albuterol (PROVENTIL HFA;VENTOLIN HFA) 108 (90 BASE) MCG/ACT inhaler, Inhale 2 puffs into the lungs every 6 (six) hours as needed for wheezing or shortness of breath., Disp: , Rfl:    amiodarone (PACERONE) 200 MG tablet, Take 0.5 tablets (100 mg total) by mouth daily., Disp: 15 tablet, Rfl: 6   apixaban (ELIQUIS) 5 MG TABS tablet, Take 1 tab (5 mg)  Twice a day, Disp: 60 tablet, Rfl: 4   brimonidine (ALPHAGAN) 0.2 % ophthalmic solution, Place 1 drop into the left eye 2 (two) times daily., Disp: , Rfl: 5   carvedilol (COREG) 3.125 MG tablet, Take 1 tablet (3.125 mg total) by mouth 2 (two) times daily with a meal., Disp: 180 tablet, Rfl: 3   cetirizine (ZYRTEC) 10 MG tablet, Take 10 mg by mouth daily as needed for allergies. , Disp: , Rfl:    dorzolamide (TRUSOPT) 2 % ophthalmic solution, Place 1 drop into both eyes 2 (two) times daily., Disp: , Rfl:    erythromycin ophthalmic ointment, Place 1 application into both eyes at bedtime. , Disp: , Rfl: 3   Glucosamine-Chondroit-Vit C-Mn (GLUCOSAMINE 1500 COMPLEX) CAPS, Take 1 capsule by mouth daily., Disp: , Rfl:    latanoprost (XALATAN) 0.005 % ophthalmic solution, Place 1 drop into both eyes at bedtime., Disp: , Rfl:    MEGARED OMEGA-3 KRILL OIL 500 MG CAPS, Take 500 mg by mouth daily. , Disp: , Rfl:    montelukast (SINGULAIR) 10 MG tablet, Take 10 mg by mouth at bedtime., Disp: , Rfl:    Multiple  Vitamins-Minerals (MULTIVITAMIN WITH MINERALS) tablet, Take 1 tablet by mouth daily., Disp: , Rfl:    timolol (TIMOPTIC) 0.5 % ophthalmic solution, Place 1 drop into the left eye 2 (two) times daily., Disp: , Rfl: 4   valACYclovir (VALTREX) 500 MG tablet, Take 500 mg by mouth daily., Disp: , Rfl:   Past Medical History: Past Medical History:  Diagnosis Date   Allergy    Asthma    CHF (congestive heart failure) ([place])    10/17 WATCHMAN FILTER IMPLANTED   Chronic kidney disease    Kidney stones   COPD (chronic obstructive pulmonary disease) ([place])    Dysrhythmia    WATCHMAN FILTER 10/17   Glaucoma (increased eye pressure)    Hearing loss    History of shingles 4 plus years ago   set in left eye   Hypertension    OSA on CPAP    Persistent atrial fibrillation ([place]) 06/05/2016   Presence of permanent cardiac pacemaker    2010   Renal insufficiency    Sebaceous cyst    Upper back   Shortness of breath dyspnea    with exertion   Sinus bradycardia    s/p MDT PPM    Traumatic brain injury ([place]) 01/2005   Ventricular tachycardia ([place])    05/2016 requiring DCCV - started on amiodarone     Tobacco Use: History  Smoking Status   Former Smoker   Quit date: 04/14/1974  Smokeless Tobacco  Former Systems developer   Quit date: 04/14/1974    Labs: Recent Review [facility]iac [facility] Latest Ref Rng & Units 06/05/2016 06/05/2016 06/05/2016   Hemoglobin A1c 4.8 - 5.6 % - - 6.2(H)   HCO3 20.0 - 24.0 mEq/L 15.9(L) - -   TCO2 0 - 100 mmol/L 17 18 -   ACIDBASEDEF 0.0 - 2.0 mmol/L 10.0(H) - -   O2SAT % 94.0 - -       ADL UCSD:     Pulmonary Assessment Scores    Row Name 09/30/16 1209         ADL UCSD   ADL Phase Entry     SOB Score total 13     Rest 0     Walk 1     Stairs 2     Bath 0     Dress 1     Shop 1        Pulmonary Function Assessment:     Pulmonary Function Assessment - 09/30/16 1208      Pulmonary Function  Tests   FVC% 65 %   FEV1% 79 %   FEV1/FVC Ratio 87.43     Initial Spirometry Results   Comments Test performed 09/30/16     Breath   Bilateral Breath Sounds Clear   Shortness of Breath No      Exercise Target Goals:    Exercise Program Goal: Individual exercise prescription set with THRR, safety & activity barriers. Participant demonstrates ability to understand and report RPE using BORG scale, to self-measure pulse accurately, and to acknowledge the importance of the exercise prescription.  Exercise Prescription Goal: Starting with aerobic activity 30 plus minutes a day, 3 days per week for initial exercise prescription. Provide home exercise prescription and guidelines that participant acknowledges understanding prior to discharge.  Activity Barriers & Risk Stratification:     Activity Barriers & Cardiac Risk Stratification - 09/30/16 1203      Activity Barriers & Cardiac Risk Stratification   Activity Barriers Other (comment)   Comments Pacemaker,  atrial fib   Cardiac Risk Stratification Moderate      6 Minute Walk:     6 Minute Walk    Row Name 09/30/16 1219         6 Minute Walk   Distance 730 feet     Walk Time 6 minutes     # of Rest Breaks 0     MPH 1.38     METS 2     RPE 13     Perceived Dyspnea  3     VO2 Peak 3.85     Symptoms No     Resting HR 64 bpm     Resting BP 118/60     Max Ex. HR 125 bpm     Max Ex. BP 122/60       Interval HR   Baseline HR 64     1 Minute HR 103     2 Minute HR 120     3 Minute HR 115     4 Minute HR 122     5 Minute HR 125     6 Minute HR 116     2 Minute Post HR 99     Interval Heart Rate? Yes       Interval Oxygen   Interval Oxygen? Yes     Baseline Oxygen Saturation % 98 %     1 Minute  Oxygen Saturation % 96 %     2 Minute Oxygen Saturation % 98 %     3 Minute Oxygen Saturation % 98 %     4 Minute Oxygen Saturation % 98 %     5 Minute Oxygen Saturation % 98 %     6 Minute Oxygen Saturation % 98 %      2 Minute Post Oxygen Saturation % 97 %       Oxygen Initial Assessment:   Oxygen Re-Evaluation:   Oxygen Discharge (Final Oxygen Re-Evaluation):   Initial Exercise Prescription:     Initial Exercise Prescription - 09/30/16 1200      Date of Initial Exercise RX and Referring Provider   Date 09/30/16     Treadmill   MPH 1.4   Grade 0   Minutes 15   METs 2     NuStep   Level 2   Minutes 15   METs 2     Biostep-RELP   Level 2   Minutes 15   METs 2     Prescription Details   Frequency (times per week) 3   Duration Progress to 45 minutes of aerobic exercise without signs/symptoms of physical distress     Intensity   THRR 40-80% of Max Heartrate 93-120   Ratings of Perceived Exertion 11-13   Perceived Dyspnea 0-4     Resistance Training   Training Prescription Yes   Weight 3   Reps 10-12      Perform Capillary Blood Glucose checks as needed.  Exercise Prescription Changes:     Exercise Prescription Changes    Row Name 12/08/16 1200 12/11/16 1100 01/07/17 1100         Response to Exercise   Blood Pressure (Admit) 120/72 124/64 130/62     Blood Pressure (Exercise) 148/64 124/56 124/64     Blood Pressure (Exit) 118/66 106/60 126/70     Heart Rate (Admit) 69 bpm 83 bpm 66 bpm     Heart Rate (Exercise) 107 bpm 109 bpm 119 bpm     Heart Rate (Exit) 60 bpm 75 bpm 68 bpm     Oxygen Saturation (Admit) 98 % 100 % 97 %     Oxygen Saturation (Exercise) 97 % 99 % 96 %     Oxygen Saturation (Exit) 95 % 98 % 98 %     Rating of Perceived Exertion (Exercise) _0 Perceived Dyspnea (Exercise) _1 Duration  -- Progress to 45 minutes of aerobic exercise without signs/symptoms of physical distress Continue with 45 min of aerobic exercise without signs/symptoms of physical distress.     Intensity  -- THRR unchanged THRR unchanged       Progression   Progression  --  -- Continue to progress workloads to maintain intensity without signs/symptoms of  physical distress.       Resistance Training   Training Prescription Yes Yes Yes     Weight _2 Interval Training   Interval Training  -- No  --       Treadmill   MPH 1.4 1.7 2     Grade 0 0 0     Minutes _3 METs 2 2.3 2.53       NuStep   Level 2  -- 5     Minutes 15  -- 15     METs 2  --  3.3       Biostep-RELP   Level _0 Minutes _1 METs 2 2.2 3        Exercise Comments:     Exercise Comments    Row Name 12/08/16 1226 12/10/16 1120 12/11/16 1141 12/17/16 1230 12/22/16 1106   Exercise Comments First full day of exercise!  Patient was oriented to gym and equipment including functions, settings, policies, and procedures.  Patient's individual exercise prescription and treatment plan were reviewed.  All starting workloads were established based on the results of the 6 minute walk test done at initial orientation visit.  The plan for exercise progression was also introduced and progression will be customized based on patient's performance and goals Discussed with Douglas Washington about need for appropriate footwear and on getting fitted for proper tennis shoes. Douglas Washington verbalized understanding about this need to wear appropriate footwear for exercise.  Douglas Washington is tolerating exercise very well in his first week. : Reviewed home exercise with pt today.  Pt plans to use weights and golf for exercise.  Reviewed THR, pulse, RPE, sign and symptoms, NTG use, and when to call 911 or MD.  Also discussed weather considerations and indoor options.  Pt voiced understanding. --   Row Name 01/07/17 1202           Exercise Comments Douglas Washington continues to progress with exercise adding intensity to all stations.           Exercise Goals and Review:   Exercise Goals Re-Evaluation :   Discharge Exercise Prescription (Final Exercise Prescription Changes):     Exercise Prescription Changes - 01/07/17 1100      Response to Exercise   Blood Pressure (Admit) 130/62   Blood Pressure  (Exercise) 124/64   Blood Pressure (Exit) 126/70   Heart Rate (Admit) 66 bpm   Heart Rate (Exercise) 119 bpm   Heart Rate (Exit) 68 bpm   Oxygen Saturation (Admit) 97 %   Oxygen Saturation (Exercise) 96 %   Oxygen Saturation (Exit) 98 %   Rating of Perceived Exertion (Exercise) 13   Perceived Dyspnea (Exercise) 2   Duration Continue with 45 min of aerobic exercise without signs/symptoms of physical distress.   Intensity THRR unchanged     Progression   Progression Continue to progress workloads to maintain intensity without signs/symptoms of physical distress.     Resistance Training   Training Prescription Yes   Weight 3     Treadmill   MPH 2   Grade 0   Minutes 15   METs 2.53     NuStep   Level 5   Minutes 15   METs 3.3     Biostep-RELP   Level 5   Minutes 15   METs 3      Nutrition:  Target Goals: Understanding of nutrition guidelines, daily intake of sodium <1552m, cholesterol <2079m calories 30% from fat and 7% or less from saturated fats, daily to have 5 or more servings of fruits and vegetables.  Biometrics:     Pre Biometrics - 09/30/16 1218      Pre Biometrics   Height _2  (1.778 m)   Weight 213 lb (96.6 kg)   Waist Circumference 46 inches   Hip Circumference 47 inches   Waist to Hip Ratio 0.98 %   BMI (Calculated) 30.6       Nutrition Therapy Plan and Nutrition Goals:   Nutrition Discharge: Rate Your Plate Scores:  Nutrition Goals Re-Evaluation:   Nutrition Goals Discharge (Final Nutrition Goals Re-Evaluation):   Psychosocial: Target Goals: Acknowledge presence or absence of significant depression and/or stress, maximize coping skills, provide positive support system. Participant is able to verbalize types and ability to use techniques and skills needed for reducing stress and depression.   Initial Review & Psychosocial Screening:     Initial Psych Review & Screening - 09/30/16 Douglas? Yes   Comments Douglas Washington has good family support from his wife and children. 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  He did loss his one son to kidney cancer in April, but does not seem to be grieving at this time.     Barriers   Psychosocial barriers to participate in program The patient should benefit from training in stress management and relaxation.     Screening Interventions   Interventions Encouraged to exercise;Program counselor consult      Quality of Life Scores:     Quality of Life - 09/30/16 1228      Quality of Life Scores   Health/Function Pre 20.19 %   Socioeconomic Pre 19.94 %   Psych/Spiritual Pre 20.25 %   Family Pre 20.14 %   GLOBAL Pre 21 %      PHQ-9: Recent Review Flowsheet Data    Depression screen [facility] 2/9 09/30/2016   Decreased Interest 0   Down, Depressed, Hopeless 0   PHQ - 2 Score 0   Altered sleeping 0   Tired, decreased energy 1   Change in appetite 0   Feeling bad or failure about yourself  0   Trouble concentrating 0   Moving slowly or fidgety/restless 0   Suicidal thoughts 0   PHQ-9 Score 1     Interpretation of Total Score  Total Score Depression Severity:  1-4 = Minimal depression, 5-9 = Mild depression, 10-14 = Moderate depression, 15-19 = Moderately severe depression, 20-27 = Severe depression   Psychosocial Evaluation and Intervention:     Psychosocial Evaluation - 12/08/16 1111      Psychosocial Evaluation & Interventions   Interventions Encouraged to exercise with the program and follow exercise prescription;Stress management education   Comments Counselor met with Douglas. [place] Douglas Washington) today for initial psychosocial evaluation.  He is an 83 year old who, in addition to a diagnosis of COPD, had a heart attack last August and then had subsequent surgeries for his heart and to remove a kidney stone through October, 2017.  He then became Sepsis in January following the removal of a stent for  his kidneys.  so he has been through quite a lot in the past 6-9 months.  He has a strong support system with a spouse of almost 46 years and a daughter and son-in-law who live close by.  Mckenzie has another son in the [place] area and a son who died last spring due to kidney cancer.  Douglas Washington reports sleeping well and having a good appetite.  He denies a history of depression or anxiety or any current symptoms.  He states his primary stress currently is realizing he is unable to do the things he used to do.  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  He is realizing his limitations more due to his health and this is hard to accept.  Although Douglas Washington  states he is typically in a positive mood and is grateful to "be alive."  Douglas Washington has goals to be better educated on his diagnoses and improve his quality of life and breathing.  Staff will be following with him throughout the course of this program.     Expected Outcomes Maxton is expected to exercise consistently; improve his breathing and be better educated on his diagnoses and realistic expectations for him as a result.     Continue Psychosocial Services  Follow up required by staff      Psychosocial Re-Evaluation:     Psychosocial Re-Evaluation    Douglas Washington Name 12/22/16 1049 12/31/16 1106           Psychosocial Re-Evaluation   Comments Counselor follow up with Douglas Washington today reporting he is feeling stronger overall and able to do more normal activities since coming into this program.  He states he is still unable to get up if he falls, which is rare, but other than that, he recognizes breathing better and increased core strength.  Counselor commended Douglas Washington for his consistency and progress made in such a short time.  Staff will continue to follow with Douglas Washington while in this program.  Counselor follow up with Douglas Washington today stating he continues to feel stronger and is using hand weights at home daily.  Counselor commended  Douglas Washington for his progress made while in this program as he also reports he is breathing better as well.  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  Counselor commended Douglas Washington on his positive choices and setting healthier more realistic limits.  He continues to have a strong support system and a positive mood.  Staff will continue to follow with Douglas Washington throughout the course of this program.        Expected Outcomes  -- Sharrieff will continue to exercise consistently both at class and at home.  Samari will continue to experience strength and stamina increase as a result.  Alontae is making healthy choices for his cattle farm with setting more realistic limits.  Counselor commended [place] for all his progress and healthy choices.        Continue Psychosocial Services   -- Follow up required by staff         Psychosocial Discharge (Final Psychosocial Re-Evaluation):     Psychosocial Re-Evaluation - 12/31/16 1106      Psychosocial Re-Evaluation   Comments Counselor follow up with Douglas Washington today stating he continues to feel stronger and is using hand weights at home daily.  Counselor commended Douglas Washington for his progress made while in this program as he also reports he is breathing better as well.  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  Counselor commended Douglas Washington on his positive choices and setting healthier more realistic limits.  He continues to have a strong support system and a positive mood.  Staff will continue to follow with Douglas Washington throughout the course of this program.     Expected Outcomes Douglas Washington will continue to exercise consistently both at class and at home.  Douglas Washington will continue to experience strength and stamina increase as a  result.  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  Counselor commended Douglas Washington for all his progress and healthy choices.     Continue Psychosocial Services  Follow up required by staff      Education: Education Goals: Education classes will be provided on a weekly basis, covering required topics. Participant will state understanding/return demonstration of topics presented.  Learning Barriers/Preferences:     Learning Barriers/Preferences - 09/30/16 1208      Learning Barriers/Preferences   Learning Barriers None   Learning Preferences None      Education Topics: Initial Evaluation Education: - Verbal, written and demonstration of respiratory meds, RPE/PD scales, oximetry and breathing techniques. Instruction on use of nebulizers and MDIs: cleaning and proper use, rinsing mouth with steroid doses and importance of monitoring MDI activations.   Pulmonary Rehab from 01/02/2017 in [facility] Cardiac and Pulmonary Rehab  Date  09/30/16  Educator  LB  Instruction Review Code  2- meets goals/outcomes      General Nutrition Guidelines/Fats and Fiber: -Group instruction provided by verbal, written material, models and posters to present the general guidelines for heart healthy nutrition. Gives an explanation and review of dietary fats and fiber.   Pulmonary Rehab from 01/02/2017 in [facility] Cardiac and Pulmonary Rehab  Date  12/15/16  Educator  CR  Instruction Review Code  2- meets goals/outcomes      Controlling Sodium/Reading Food Labels: -Group verbal and written material supporting the discussion of sodium use in heart healthy nutrition. Review and explanation with models, verbal and written materials for utilization of the food label.   Pulmonary Rehab from 01/02/2017 in [facility] Cardiac and Pulmonary Rehab  Date  12/29/16  Educator  CR  Instruction Review Code  2- meets goals/outcomes      Exercise Physiology & Risk Factors: - Group verbal and  written instruction with models to review the exercise physiology of the cardiovascular system and associated critical values. Details cardiovascular disease risk factors and the goals associated with each risk factor.   Aerobic Exercise & Resistance Training: - Gives group verbal and written discussion on the health impact of inactivity. On the components of aerobic and resistive training programs and the benefits of this training and how to safely progress through these programs.   Flexibility, Balance, General Exercise Guidelines: - Provides group verbal and written instruction on the benefits of flexibility and balance training programs. Provides general exercise guidelines with specific guidelines to those with heart or lung disease. Demonstration and skill practice provided.   Stress Management: - Provides group verbal and written instruction about the health risks of elevated stress, cause of high stress, and healthy ways to reduce stress.   Depression: - Provides group verbal and written instruction on the correlation between heart/lung disease and depressed mood, treatment options, and the stigmas associated with seeking treatment.   Pulmonary Rehab from 01/02/2017 in [facility] Cardiac and Pulmonary Rehab  Date  12/17/16  Educator  [facility]  Instruction Review Code  2- meets goals/outcomes      Exercise & Equipment Safety: - Individual verbal instruction and demonstration of equipment use and safety with use of the equipment.   Pulmonary Rehab from 01/02/2017 in [facility] Cardiac and Pulmonary Rehab  Date  12/08/16  Educator  AS  Instruction Review Code  2- meets goals/outcomes      Infection Prevention: - Provides verbal and written material to individual with discussion of infection control including proper hand washing and proper equipment cleaning during exercise session.   Pulmonary Rehab from 01/02/2017 in [facility] Cardiac  and Pulmonary Rehab  Date  12/08/16  Educator  AS  Instruction  Review Code  2- meets goals/outcomes      Falls Prevention: - Provides verbal and written material to individual with discussion of falls prevention and safety.   Pulmonary Rehab from 01/02/2017 in [facility] Cardiac and Pulmonary Rehab  Date  12/08/16  Educator  AS  Instruction Review Code  2- meets goals/outcomes      Diabetes: - Individual verbal and written instruction to review signs/symptoms of diabetes, desired ranges of glucose level fasting, after meals and with exercise. Advice that pre and post exercise glucose checks will be done for 3 sessions at entry of program.   Chronic Lung Diseases: - Group verbal and written instruction to review new updates, new respiratory medications, new advancements in procedures and treatments. Provide informative websites and "800" numbers of self-education.   Lung Procedures: - Group verbal and written instruction to describe testing methods done to diagnose lung disease. Review the outcome of test results. Describe the treatment choices: Pulmonary Function Tests, ABGs and oximetry.   Energy Conservation: - Provide group verbal and written instruction for methods to conserve energy, plan and organize activities. Instruct on pacing techniques, use of adaptive equipment and posture/positioning to relieve shortness of breath.   Pulmonary Rehab from 01/02/2017 in [facility] Cardiac and Pulmonary Rehab  Date  12/10/16  Educator  [facility]  Instruction Review Code  2- meets goals/outcomes      Triggers: - Group verbal and written instruction to review types of environmental controls: home humidity, furnaces, filters, dust mite/pet prevention, HEPA vacuums. To discuss weather changes, air quality and the benefits of nasal washing.   Exacerbations: - Group verbal and written instruction to provide: warning signs, infection symptoms, calling MD promptly, preventive modes, and value of vaccinations. Review: effective airway clearance, coughing and/or vibration  techniques. Create an Sports administrator.   Oxygen: - Individual and group verbal and written instruction on oxygen therapy. Includes supplement oxygen, available portable oxygen systems, continuous and intermittent flow rates, oxygen safety, concentrators, and Medicare reimbursement for oxygen.   Respiratory Medications: - Group verbal and written instruction to review medications for lung disease. Drug class, frequency, complications, importance of spacers, rinsing mouth after steroid MDI's, and proper cleaning methods for nebulizers.   Pulmonary Rehab from 01/02/2017 in [facility] Cardiac and Pulmonary Rehab  Date  09/30/16  Educator  LB  Instruction Review Code  2- meets goals/outcomes      AED/CPR: - Group verbal and written instruction with the use of models to demonstrate the basic use of the AED with the basic ABC's of resuscitation.   Breathing Retraining: - Provides individuals verbal and written instruction on purpose, frequency, and proper technique of diaphragmatic breathing and pursed-lipped breathing. Applies individual practice skills.   Anatomy and Physiology of the Lungs: - Group verbal and written instruction with the use of models to provide basic lung anatomy and physiology related to function, structure and complications of lung disease.   Pulmonary Rehab from 01/02/2017 in [facility] Cardiac and Pulmonary Rehab  Date  01/02/17  Educator  LB  Instruction Review Code  2- meets goals/outcomes      Heart Failure: - Group verbal and written instruction on the basics of heart failure: signs/symptoms, treatments, explanation of ejection fraction, enlarged heart and cardiomyopathy.   Sleep Apnea: - Individual verbal and written instruction to review Obstructive Sleep Apnea. Review of risk factors, methods for diagnosing and types of masks and machines for OSA.  Anxiety: - Provides group, verbal and written instruction on the correlation between heart/lung disease and anxiety,  treatment options, and management of anxiety.   Relaxation: - Provides group, verbal and written instruction about the benefits of relaxation for patients with heart/lung disease. Also provides patients with examples of relaxation techniques.   Knowledge Questionnaire Score:     Knowledge Questionnaire Score - 09/30/16 1208      Knowledge Questionnaire Score   Pre Score 5/10       Core Components/Risk Factors/Patient Goals at Admission:     Personal Goals and Risk Factors at Admission - 09/30/16 1213      Core Components/Risk Factors/Patient Goals on Admission   Sedentary Yes  [place] 46mns[place]   Intervention Provide advice, education, support and counseling about physical activity/exercise needs.;Develop an individualized exercise prescription for aerobic and resistive training based on initial evaluation findings, risk stratification, comorbidities and participant's personal goals.   Expected Outcomes Achievement of increased cardiorespiratory fitness and enhanced flexibility, muscular endurance and strength shown through measurements of functional capacity and personal statement of participant.   Increase Strength and Stamina Yes  Home TM and Exercise bike; uses 2-3lb weights 90 reps several days/week   Intervention Provide advice, education, support and counseling about physical activity/exercise needs.;Develop an individualized exercise prescription for aerobic and resistive training based on initial evaluation findings, risk stratification, comorbidities and participant's personal goals.   Expected Outcomes Achievement of increased cardiorespiratory fitness and enhanced flexibility, muscular endurance and strength shown through measurements of functional capacity and personal statement of participant.   Develop more efficient breathing techniques such as purse lipped breathing and diaphragmatic breathing; and practicing self-pacing with activity Yes    Intervention Provide education, demonstration and support about specific breathing techniuqes utilized for more efficient breathing. Include techniques such as pursed lipped breathing, diaphragmatic breathing and self-pacing activity.   Expected Outcomes Short Term: Participant will be able to demonstrate and use breathing techniques as needed throughout daily activities.   Increase knowledge of respiratory medications and ability to use respiratory devices properly  Yes  ProAir with spacer; CPAP 12cmH2O, nasal mask   Intervention Provide education and demonstration as needed of appropriate use of medications, inhalers, and oxygen therapy.   Expected Outcomes Short Term: Achieves understanding of medications use. Understands that oxygen is a medication prescribed by physician. Demonstrates appropriate use of inhaler and oxygen therapy.   Lipids Yes   Intervention Provide education and support for participant on nutrition & aerobic/resistive exercise along with prescribed medications to achieve LDL <767m HDL >4057m  Expected Outcomes Short Term: Participant states understanding of desired cholesterol values and is compliant with medications prescribed. Participant is following exercise prescription and nutrition guidelines.;Long Term: Cholesterol controlled with medications as prescribed, with individualized exercise RX and with personalized nutrition plan. Value goals: LDL < 28m59mDL > 40 mg.      Core Components/Risk Factors/Patient Goals Review:      Goals and Risk Factor Review    Row Name 12/08/16 1229 12/17/16 1535 12/31/16 1402         Core Components/Risk Factors/Patient Goals Review   Personal Goals Review Develop more efficient breathing techniques such as purse lipped breathing and diaphragmatic breathing and practicing self-pacing with activity. Weight Management/Obesity;Improve shortness of breath with ADL's;Develop more efficient breathing techniques such as purse lipped  breathing and diaphragmatic breathing and practicing self-pacing with activity.;Increase knowledge of respiratory medications and ability to use respiratory devices properly.;Lipids Improve shortness of breath with ADL's;Increase  knowledge of respiratory medications and ability to use respiratory devices properly.;Develop more efficient breathing techniques such as purse lipped breathing and diaphragmatic breathing and practicing self-pacing with activity.;Lipids     Review PLB was demonstrated and discussed with patient. He demonstrated understanding of how to use PLB during exercise and daily activities to help control SOB.  Douglas Washington has completed 5 sessions of [facility] and has progressed his exercise goals. He uses his CPAP every night and has a good understanding of the importance of this therapy. I gave him a spacer for his ProAir and will instruct him on it's use in the next class. He does not use his ProAir frequently. He has good technique with his PLB and is qued to use the technique. Douglas Washington is enjooying the socilaization. Douglas Washington is progressing with his exercise goals. He also walks outside when the weather is nice or uses his treadmill indoors on the days he has off from [place]. He has a good understanding of his CPAP and is very compliant every night. For his ProAir, I gave him a spacer with directions and cleaning instructions. He uses hand weights at home - arm curls. Douglas Washington attends regularly and enjoys the education and socialization.     Expected Outcomes Patient will use PLB during exercise and daily activties to help control SOB and maintain oxygen saturations.  Continue to progress with his exercise goals and use PLB with exercise and with home activities. Continue progressing with his exercise and properly use the spacer with his Proair.        Core Components/Risk Factors/Patient Goals at Discharge (Final Review):      Goals and Risk Factor Review - 12/31/16 1402      Core  Components/Risk Factors/Patient Goals Review   Personal Goals Review Improve shortness of breath with ADL's;Increase knowledge of respiratory medications and ability to use respiratory devices properly.;Develop more efficient breathing techniques such as purse lipped breathing and diaphragmatic breathing and practicing self-pacing with activity.;Lipids   Review Douglas Washington is progressing with his exercise goals. He also walks outside when the weather is nice or uses his treadmill indoors on the days he has off from [place]. He has a good understanding of his CPAP and is very compliant every night. For his ProAir, I gave him a spacer with directions and cleaning instructions. He uses hand weights at home - arm curls. Douglas Washington attends regularly and enjoys the education and socialization.   Expected Outcomes Continue progressing with his exercise and properly use the spacer with his Proair.      ITP Comments:     ITP Comments    Row Name 10/10/16 1106 10/20/16 0855 11/18/16 1333 12/08/16 1532     ITP Comments Returned call.  Douglas Washington is scheduled for a stent removal on Tuesday.  He is cleared to return from cardiac and kidney standpoint.  Spoke with Jana Half about making sure that Dr. Yves Dill provides clearance for Douglas Washington to start rehab after his stent removal. Called Douglas Pontillo to check on his condition from his hospitalization. I spoke with Mrs Depuy who states Douglas Lemoine will have 2 weeks of antibiotics through his IV line and will have home nursing and PT therapy for several weeks. Douglas Ashworth does want to return to [place] after clearance from his physician. Called Douglas Tagliaferro - last attended 09/30/17 due to hospitalization. I spoke with Ms Heemstra, and she states Douglas Hardrick is doing better - IV line removed, finished treatment from home RN's  and PT's, and continuation of home exercises. He has an appointment 12/05/16 with Dr Rayann Heman and will inquire for a clearance to return to [place]. Douglas Scott brought in clearance to return to  [place].        Comments: 30 Day Note Review

## 2017-01-21 ENCOUNTER — Ambulatory Visit: Payer: Medicare Other

## 2017-01-21 DIAGNOSIS — J45909 Unspecified asthma, uncomplicated: Secondary | ICD-10-CM | POA: Diagnosis not present

## 2017-01-21 DIAGNOSIS — I5020 Unspecified systolic (congestive) heart failure: Secondary | ICD-10-CM

## 2017-01-21 NOTE — Progress Notes (Signed)
Daily Session Note  Patient Details  Name: Douglas Washington MRN: [id number] Date of Birth: 03/13/1929 Referring Provider:    Encounter Date: 01/21/2017  Check In:     Session Check In - 01/21/17 1233      Check-In   Location [facility]-Cardiac & Pulmonary Rehab   Staff Present Laureen Brown, BS, RRT, Respiratory Therapist;Jessica Hawkins, MA, ACSM RCEP, Exercise Physiologist;Amanda Sommer, BA, ACSM CEP, Exercise Physiologist   Supervising physician immediately available to respond to emergencies [facility] immediately available ER MD   Physician(s) Williams and Yao   Medication changes reported     No   Fall or balance concerns reported    No   Warm-up and Cool-down Performed as group-led instruction   Resistance Training Performed Yes   VAD Patient? No     Pain Assessment   Currently in Pain? No/denies         History  Smoking Status  . Former Smoker  . Quit date: 04/14/1974  Smokeless Tobacco  . Former User  . Quit date: 04/14/1974    Goals Met:  Proper associated with RPD/PD & O2 Sat Independence with exercise equipment Exercise tolerated well Strength training completed today  Goals Unmet:  Not Applicable  Comments: Pt able to follow exercise prescription today without complaint.  Will continue to monitor for progression.    Dr. Mark Miller is Medical Director for [facility] and [facility]. 

## 2017-01-23 ENCOUNTER — Ambulatory Visit: Payer: Medicare Other

## 2017-01-23 DIAGNOSIS — J45909 Unspecified asthma, uncomplicated: Secondary | ICD-10-CM

## 2017-01-23 DIAGNOSIS — I5020 Unspecified systolic (congestive) heart failure: Secondary | ICD-10-CM

## 2017-01-23 NOTE — Progress Notes (Signed)
Daily Session Note  Patient Details  Name: Douglas Washington MRN: [id number] Date of Birth: 08/07/1929 Referring Provider:    Encounter Date: 01/23/2017  Check In:     Session Check In - 01/23/17 1050      Check-In   Location [facility]-Cardiac & Pulmonary Rehab   Staff Present Gerlene Burdock, RN, Vickki Hearing, BA, ACSM CEP, Exercise Physiologist;Other  Darel Hong RN   Supervising physician immediately available to respond to emergencies [facility] immediately available ER MD   Physician(s) Burlene Arnt and Schaevitz   Medication changes reported     No   Fall or balance concerns reported    No   Warm-up and Cool-down Performed as group-led Location manager Performed Yes   VAD Patient? No     Pain Assessment   Currently in Pain? No/denies         History  Smoking Status  . Former Smoker  . Quit date: 04/14/1974  Smokeless Tobacco  . Former Systems developer  . Quit date: 04/14/1974    Goals Met:  Independence with exercise equipment Exercise tolerated well No report of cardiac concerns or symptoms Strength training completed today  Goals Unmet:  Not Applicable  Comments: Pt able to follow exercise prescription today without complaint.  Will continue to monitor for progression.    Dr. Emily Filbert is Medical Director for [place] and [facility].

## 2017-01-26 ENCOUNTER — Encounter: Payer: Medicare Other | Admitting: *Deleted

## 2017-01-26 ENCOUNTER — Ambulatory Visit: Payer: Medicare Other

## 2017-01-26 DIAGNOSIS — J45909 Unspecified asthma, uncomplicated: Secondary | ICD-10-CM

## 2017-01-26 NOTE — Progress Notes (Signed)
Daily Session Note  Patient Details  Name: Douglas Washington MRN: [id number] Date of Birth: [birth year] Referring Provider:    Encounter Date: 01/26/2017  Check In:     Session Check In - 01/26/17 1008      Check-In   Location [facility]-Cardiac & Pulmonary Rehab   Staff Present Nada Maclachlan, BA, ACSM CEP, Exercise Physiologist;Laureen Owens Shark, BS, RRT, Respiratory Bertis Ruddy, BS, ACSM CEP, Exercise Physiologist   Supervising physician immediately available to respond to emergencies [facility] immediately available ER MD   Physician(s) Drs. Rifenbark and Kinner   Medication changes reported     No   Fall or balance concerns reported    No   Warm-up and Cool-down Performed as group-led Location manager Performed Yes   VAD Patient? No     Pain Assessment   Currently in Pain? No/denies   Multiple Pain Sites No         History  Smoking Status  . Former Smoker  . Quit date: 04/14/1974  Smokeless Tobacco  . Former Systems developer  . Quit date: 04/14/1974    Goals Met:  Proper associated with RPD/PD & O2 Sat Independence with exercise equipment Exercise tolerated well Strength training completed today  Goals Unmet:  Not Applicable  Comments: Pt able to follow exercise prescription today without complaint.  Will continue to monitor for progression.    Dr. Emily Filbert is Medical Director for [place] and [facility].

## 2017-01-28 ENCOUNTER — Ambulatory Visit: Payer: Medicare Other

## 2017-01-28 DIAGNOSIS — J45909 Unspecified asthma, uncomplicated: Secondary | ICD-10-CM | POA: Diagnosis not present

## 2017-01-28 NOTE — Progress Notes (Signed)
Daily Session Note  Patient Details  Name: Douglas Washington MRN: [id number] Date of Birth: [birth year] Referring Provider:    Encounter Date: 01/28/2017  Check In:     Session Check In - 01/28/17 1200      Check-In   Location [facility]-Cardiac & Pulmonary Rehab   Staff Present Carson Myrtle, BS, RRT, Respiratory Lennie Hummer, MA, ACSM RCEP, Exercise Physiologist;Kymari Nuon Oletta Darter, BA, ACSM CEP, Exercise Physiologist   Supervising physician immediately available to respond to emergencies [facility] immediately available ER MD   Physician(s) Devra Dopp and Quentin Cornwall   Medication changes reported     No   Fall or balance concerns reported    No   Warm-up and Cool-down Performed as group-led instruction   Resistance Training Performed Yes   VAD Patient? No     Pain Assessment   Currently in Pain? No/denies   Multiple Pain Sites No         History  Smoking Status  . Former Smoker  . Quit date: 04/14/1974  Smokeless Tobacco  . Former Systems developer  . Quit date: 04/14/1974    Goals Met:  Proper associated with RPD/PD & O2 Sat Independence with exercise equipment Exercise tolerated well Strength training completed today  Goals Unmet:  Not Applicable  Comments: Pt able to follow exercise prescription today without complaint.  Will continue to monitor for progression.    Dr. Emily Filbert is Medical Director for [place] and [facility].

## 2017-01-30 DIAGNOSIS — J45909 Unspecified asthma, uncomplicated: Secondary | ICD-10-CM

## 2017-01-30 NOTE — Progress Notes (Signed)
Daily Session Note  Patient Details  Name: Douglas Washington MRN: [id number] Date of Birth: January 22, 1929 Referring Provider:    Encounter Date: 01/30/2017  Check In:     Session Check In - 01/30/17 1123      Check-In   Location [facility] Pulmonary Re[facility]   Staff Present Nyoka Cowden, RN, BSN, MA;Carroll Enterkin, RN, Vickki Hearing, BA, ACSM CEP, Exercise Physiologist   Supervising physician immediately available to respond to emergencies [facility] immediately available ER MD   Physician(s) Burlene Arnt and Quentin Cornwall   Medication changes reported     No   Fall or balance concerns reported    No   Warm-up and Cool-down Performed as group-led Location manager Performed Yes   VAD Patient? No     Pain Assessment   Currently in Pain? No/denies         History  Smoking Status  . Former Smoker  . Quit date: 04/14/1974  Smokeless Tobacco  . Former Systems developer  . Quit date: 04/14/1974    Goals Met:  Proper associated with RPD/PD & O2 Sat Independence with exercise equipment Exercise tolerated well Strength training completed today  Goals Unmet:  Not Applicable  Comments: Pt able to follow exercise prescription today without complaint.  Will continue to monitor for progression.    Dr. Emily Filbert is Medical Director for [place] and [facility].

## 2017-02-02 ENCOUNTER — Encounter: Payer: Medicare Other | Admitting: *Deleted

## 2017-02-02 DIAGNOSIS — J45909 Unspecified asthma, uncomplicated: Secondary | ICD-10-CM

## 2017-02-02 NOTE — Progress Notes (Signed)
Daily Session Note  Patient Details  Name: Douglas Washington MRN: [id number] Date of Birth: 02-13-1929 Referring Provider:    Encounter Date: 02/02/2017  Check In:     Session Check In - 02/02/17 1016      Check-In   Location [facility]-Card[facility] & Pulmonary Rehab   Staff Present Carson Myrtle, BS, RRT, Respiratory Dareen Piano, BA, ACSM CEP, Exercise Physiologist;Kimmy Parish Amedeo Plenty, BS, ACSM CEP, Exercise Physiologist   Supervising physician immediately available to respond to emergencies [facility] immediately available ER MD   Physician(s) Mariea Clonts and Alfred Levins   Medication changes reported     No   Fall or balance concerns reported    No   Warm-up and Cool-down Performed as group-led instruction   Resistance Training Performed Yes   VAD Patient? No     Pain Assessment   Currently in Pain? No/denies   Multiple Pain Sites No         History  Smoking Status  . Former Smoker  . Quit date: 04/14/1974  Smokeless Tobacco  . Former Systems developer  . Quit date: 04/14/1974    Goals Met:  Proper associated with RPD/PD & O2 Sat Independence with exercise equipment Exercise tolerated well Strength training completed today  Goals Unmet:  Not Applicable  Comments: Pt able to follow exercise prescription today without complaint.  Will continue to monitor for progression.    Dr. Emily Filbert is Medical Director for [place] and [facility].

## 2017-02-04 DIAGNOSIS — J45909 Unspecified asthma, uncomplicated: Secondary | ICD-10-CM

## 2017-02-04 NOTE — Progress Notes (Signed)
Daily Session Note  Patient Details  Name: Douglas Washington MRN: [id number] Date of Birth: 1929/02/12 Referring Provider:    Encounter Date: 02/04/2017  Check In:     Session Check In - 02/04/17 1150      Check-In   Location [facility]   Staff Present Carson Myrtle, BS, RRT, Respiratory Lennie Hummer, MA, ACSM RCEP, Exercise Physiologist;Jamian Andujo Oletta Darter, BA, ACSM CEP, Exercise Physiologist   Supervising physician immediately available to respond to emergencies [facility] immediately available ER MD   Physician(s) Joni Fears and Jimmye Norman   Medication changes reported     No   Fall or balance concerns reported    No   Warm-up and Cool-down Performed as group-led instruction   Resistance Training Performed Yes   VAD Patient? No     Pain Assessment   Currently in Pain? No/denies         History  Smoking Status  . Former Smoker  . Quit date: 04/14/1974  Smokeless Tobacco  . Former Systems developer  . Quit date: 04/14/1974    Goals Met:  Proper associated with RPD/PD & O2 Sat Independence with exercise equipment Exercise tolerated well Strength training completed today  Goals Unmet:  Not Applicable  Comments: Pt able to follow exercise prescription today without complaint.  Will continue to monitor for progression.    Dr. Emily Filbert is Medical Director for [place] and [facility].

## 2017-02-06 DIAGNOSIS — J45909 Unspecified asthma, uncomplicated: Secondary | ICD-10-CM

## 2017-02-06 NOTE — Progress Notes (Signed)
Daily Session Note  Patient Details  Name: Douglas Washington MRN: [id number] Date of Birth: [birth year] Referring Provider:    Encounter Date: 02/06/2017  Check In:     Session Check In - 02/06/17 1126      Check-In   Location [facility]   Staff Present Nyoka Cowden, RN, BSN, Willette Pa, MA, ACSM RCEP, Exercise Physiologist;Chason Mciver Oletta Darter, [place], ACSM CEP, Exercise Physiologist   Supervising physician immediately available to respond to emergencies [facility] immediately available ER MD   Physician(s) Tilda Franco and McShane   Medication changes reported     No   Fall or balance concerns reported    No   Warm-up and Cool-down Performed as group-led instruction   Resistance Training Performed Yes   VAD Patient? No         History  Smoking Status  . Former Smoker  . Quit date: 04/14/1974  Smokeless Tobacco  . Former Systems developer  . Quit date: 04/14/1974    Goals Met:  Proper associated with RPD/PD & O2 Sat Independence with exercise equipment Exercise tolerated well Strength training completed today  Goals Unmet:  Not Applicable  Comments: Pt able to follow exercise prescription today without complaint.  Will continue to monitor for progression.    Dr. Emily Filbert is Medical Director for [place] and [facility].

## 2017-02-09 ENCOUNTER — Encounter: Payer: Medicare Other | Admitting: *Deleted

## 2017-02-09 DIAGNOSIS — J45909 Unspecified asthma, uncomplicated: Secondary | ICD-10-CM | POA: Diagnosis not present

## 2017-02-09 NOTE — Progress Notes (Signed)
Daily Session Note  Patient Details  Name: Douglas Washington MRN: [id number] Date of Birth: [birth year] Referring Provider:    Encounter Date: 02/09/2017  Check In:     Session Check In - 02/09/17 1102      Check-In   Location [facility]   Staff Present Earlean Shawl, BS, ACSM CEP, Exercise Physiologist;Amanda Oletta Darter, BA, ACSM CEP, Exercise Physiologist;Laureen Janell Quiet, RRT, Respiratory Therapist   Supervising physician immediately available to respond to emergencies [facility] immediately available ER MD   Physician(s) Clearnce Hasten and Kinner   Medication changes reported     No   Fall or balance concerns reported    No   Warm-up and Cool-down Performed as group-led instruction   Resistance Training Performed Yes   VAD Patient? No     Pain Assessment   Currently in Pain? No/denies   Multiple Pain Sites No         History  Smoking Status  . Former Smoker  . Quit date: 04/14/1974  Smokeless Tobacco  . Former Systems developer  . Quit date: 04/14/1974    Goals Met:  Proper associated with RPD/PD & O2 Sat Independence with exercise equipment Exercise tolerated well Strength training completed today  Goals Unmet:  Not Applicable  Comments: Pt able to follow exercise prescription today without complaint.  Will continue to monitor for progression.    Dr. Emily Filbert is Medical Director for [employer] and [facility].

## 2017-02-11 ENCOUNTER — Encounter: Payer: Medicare Other | Attending: Internal Medicine

## 2017-02-11 DIAGNOSIS — G4730 Sleep apnea, unspecified: Secondary | ICD-10-CM | POA: Insufficient documentation

## 2017-02-11 DIAGNOSIS — I5020 Unspecified systolic (congestive) heart failure: Secondary | ICD-10-CM | POA: Insufficient documentation

## 2017-02-11 DIAGNOSIS — J45909 Unspecified asthma, uncomplicated: Secondary | ICD-10-CM | POA: Diagnosis present

## 2017-02-11 NOTE — Progress Notes (Signed)
Daily Session Note  Patient Details  Name: Douglas Washington MRN: [id number] Date of Birth: 29-Mar-1929 Referring Provider:    Encounter Date: 02/11/2017  Check In:     Session Check In - 02/11/17 1047      Check-In   Location [facility]   Staff Present Alberteen Sam, MA, ACSM RCEP, Exercise Physiologist;Laureen Owens Shark, BS, RRT, Respiratory Dareen Piano, BA, ACSM CEP, Exercise Physiologist   Supervising physician immediately available to respond to emergencies [facility] immediately available ER MD   Physician(s) Mariea Clonts and Reita Cliche   Medication changes reported     No   Fall or balance concerns reported    No   Warm-up and Cool-down Performed as group-led instruction   Resistance Training Performed Yes   VAD Patient? No     Pain Assessment   Currently in Pain? No/denies         History  Smoking Status  . Former Smoker  . Quit date: 04/14/1974  Smokeless Tobacco  . Former Systems developer  . Quit date: 04/14/1974    Goals Met:  Proper associated with RPD/PD & O2 Sat Independence with exercise equipment Exercise tolerated well Strength training completed today  Goals Unmet:  Not Applicable  Comments: Pt able to follow exercise prescription today without complaint.  Will continue to monitor for progression.    Dr. Emily Filbert is Medical Director for [place] and [facility].

## 2017-02-13 ENCOUNTER — Encounter: Payer: Medicare Other | Admitting: *Deleted

## 2017-02-13 DIAGNOSIS — J45909 Unspecified asthma, uncomplicated: Secondary | ICD-10-CM

## 2017-02-13 NOTE — Progress Notes (Signed)
Daily Session Note  Patient Details  Name: CRIMSON BEER MRN: [id number] Date of Birth: [birth year] Referring Provider:    Encounter Date: 02/13/2017  Check In:     Session Check In - 02/13/17 1104      Check-In   Location [facility]   Staff Present Gerlene Burdock, RN, Vickki Hearing, BA, ACSM CEP, Exercise Physiologist;Other  Daleen Snook RN   Supervising physician immediately available to respond to emergencies [facility] immediately available ER MD   Physician(s) Clearnce Hasten and Corky Downs   Medication changes reported     No   Tobacco Cessation No Change   Warm-up and Cool-down Performed as group-led instruction   Resistance Training Performed Yes   VAD Patient? No     Pain Assessment   Currently in Pain? No/denies         History  Smoking Status  . Former Smoker  . Quit date: 04/14/1974  Smokeless Tobacco  . Former Systems developer  . Quit date: 04/14/1974    Goals Met:  Proper associated with RPD/PD & O2 Sat Exercise tolerated well  Goals Unmet:  Not Applicable  Comments:     Dr. Emily Filbert is Medical Director for [facility] and [facility].

## 2017-02-16 ENCOUNTER — Encounter: Payer: Self-pay | Admitting: Respiratory Therapy

## 2017-02-16 ENCOUNTER — Encounter: Payer: Medicare Other | Admitting: *Deleted

## 2017-02-16 DIAGNOSIS — J45909 Unspecified asthma, uncomplicated: Secondary | ICD-10-CM

## 2017-02-16 NOTE — Progress Notes (Signed)
Pulmonary Individual Treatment Plan  Patient Details  Name: Douglas Washington MRN: [id number] Date of Birth: 17-Jul-1929 Referring Provider:    Initial Encounter Date:    Pulmonary Rehab from 09/30/2016 in [facility] Cardiac and Pulmonary Rehab  Date  09/30/16      Visit Diagnosis: Asthma, unspecified asthma severity, unspecified whether complicated, unspecified whether persistent  Patient's Home Medications on Admission:  Current Outpatient Prescriptions:    albuterol (PROVENTIL HFA;VENTOLIN HFA) 108 (90 BASE) MCG/ACT inhaler, Inhale 2 puffs into the lungs every 6 (six) hours as needed for wheezing or shortness of breath., Disp: , Rfl:    amiodarone (PACERONE) 200 MG tablet, Take 0.5 tablets (100 mg total) by mouth daily., Disp: 15 tablet, Rfl: 6   apixaban (ELIQUIS) 5 MG TABS tablet, Take 1 tab (5 mg)  Twice a day, Disp: 60 tablet, Rfl: 4   brimonidine (ALPHAGAN) 0.2 % ophthalmic solution, Place 1 drop into the left eye 2 (two) times daily., Disp: , Rfl: 5   carvedilol (COREG) 3.125 MG tablet, Take 1 tablet (3.125 mg total) by mouth 2 (two) times daily with a meal., Disp: 180 tablet, Rfl: 3   cetirizine (ZYRTEC) 10 MG tablet, Take 10 mg by mouth daily as needed for allergies. , Disp: , Rfl:    dorzolamide (TRUSOPT) 2 % ophthalmic solution, Place 1 drop into both eyes 2 (two) times daily., Disp: , Rfl:    erythromycin ophthalmic ointment, Place 1 application into both eyes at bedtime. , Disp: , Rfl: 3   Glucosamine-Chondroit-Vit C-Mn (GLUCOSAMINE 1500 COMPLEX) CAPS, Take 1 capsule by mouth daily., Disp: , Rfl:    latanoprost (XALATAN) 0.005 % ophthalmic solution, Place 1 drop into both eyes at bedtime., Disp: , Rfl:    MEGARED OMEGA-3 KRILL OIL 500 MG CAPS, Take 500 mg by mouth daily. , Disp: , Rfl:    montelukast (SINGULAIR) 10 MG tablet, Take 10 mg by mouth at bedtime., Disp: , Rfl:    Multiple Vitamins-Minerals (MULTIVITAMIN WITH MINERALS) tablet, Take 1 tablet by mouth daily., Disp:  , Rfl:    timolol (TIMOPTIC) 0.5 % ophthalmic solution, Place 1 drop into the left eye 2 (two) times daily., Disp: , Rfl: 4   valACYclovir (VALTREX) 500 MG tablet, Take 500 mg by mouth daily., Disp: , Rfl:   Past Medical History: Past Medical History:  Diagnosis Date   Allergy    Asthma    CHF (congestive heart failure) ([place])    10/17 WATCHMAN FILTER IMPLANTED   Chronic kidney disease    Kidney stones   COPD (chronic obstructive pulmonary disease) ([place])    Dysrhythmia    WATCHMAN FILTER 10/17   Glaucoma (increased eye pressure)    Hearing loss    History of shingles 4 plus years ago   set in left eye   Hypertension    OSA on CPAP    Persistent atrial fibrillation ([place]) 06/05/2016   Presence of permanent cardiac pacemaker    2010   Renal insufficiency    Sebaceous cyst    Upper back   Shortness of breath dyspnea    with exertion   Sinus bradycardia    s/p MDT PPM    Traumatic brain injury ([place]) 01/2005   Ventricular tachycardia ([place])    05/2016 requiring DCCV - started on amiodarone     Tobacco Use: History  Smoking Status   Former Smoker   Quit date: 04/14/1974  Smokeless Tobacco   Former Systems developer   Quit date: 04/14/1974  Labs: Recent Review Flowsheet Data    Labs for ITP Cardiac and Pulmonary Rehab Latest Ref Rng & Units 06/05/2016 06/05/2016 06/05/2016   Hemoglobin A1c 4.8 - 5.6 % - - 6.2(H)   HCO3 20.0 - 24.0 mEq/L 15.9(L) - -   TCO2 0 - 100 mmol/L 17 18 -   ACIDBASEDEF 0.0 - 2.0 mmol/L 10.0(H) - -   O2SAT % 94.0 - -       ADL UCSD:     Pulmonary Assessment Scores    Row Name 09/30/16 1209 01/21/17 1601       ADL UCSD   ADL Phase Entry Mid    SOB Score total 13 20    Rest 0 0    Walk 1 1    Stairs 2 4    Bath 0 0    Dress 1 1    Shop 1 0       Pulmonary Function Assessment:     Pulmonary Function Assessment - 09/30/16 1208      Pulmonary Function Tests   FVC% 65 %   FEV1% 79 %   FEV1/FVC Ratio 87.43     Initial  Spirometry Results   Comments Test performed 09/30/16     Breath   Bilateral Breath Sounds Clear   Shortness of Breath No      Exercise Target Goals:    Exercise Program Goal: Individual exercise prescription set with THRR, safety & activity barriers. Participant demonstrates ability to understand and report RPE using BORG scale, to self-measure pulse accurately, and to acknowledge the importance of the exercise prescription.  Exercise Prescription Goal: Starting with aerobic activity 30 plus minutes a day, 3 days per week for initial exercise prescription. Provide home exercise prescription and guidelines that participant acknowledges understanding prior to discharge.  Activity Barriers & Risk Stratification:     Activity Barriers & Cardiac Risk Stratification - 09/30/16 1203      Activity Barriers & Cardiac Risk Stratification   Activity Barriers Other (comment)   Comments Pacemaker,  atrial fib   Cardiac Risk Stratification Moderate      6 Minute Walk:     6 Minute Walk    Row Name 09/30/16 1219 01/19/17 1223       6 Minute Walk   Phase  -- Mid Program    Distance 730 feet 1061 feet    Distance % Change  -- 45 %    Walk Time 6 minutes 6 minutes    # of Rest Breaks 0 0    MPH 1.38 2    METS 2 2.53    RPE 13 12    Perceived Dyspnea  3 2    VO2 Peak 3.85 6.47    Symptoms No No    Resting HR 64 bpm 77 bpm    Resting BP 118/60 120/70    Max Ex. HR 125 bpm 118 bpm    Max Ex. BP 122/60 150/82      Interval HR   Baseline HR 64 77    1 Minute HR 103 105    2 Minute HR 120 117    3 Minute HR 115 115    4 Minute HR 122 117    5 Minute HR 125 116    6 Minute HR 116 118    2 Minute Post HR 99 90    Interval Heart Rate? Yes Yes      Interval Oxygen   Interval Oxygen? Yes Yes  Baseline Oxygen Saturation % 98 % 98 %    1 Minute Oxygen Saturation % 96 % 96 %    2 Minute Oxygen Saturation % 98 % 98 %    3 Minute Oxygen Saturation % 98 % 97 %    4 Minute  Oxygen Saturation % 98 % 97 %    5 Minute Oxygen Saturation % 98 % 98 %    6 Minute Oxygen Saturation % 98 % 98 %    2 Minute Post Oxygen Saturation % 97 % 99 %      Oxygen Initial Assessment:   Oxygen Re-Evaluation:   Oxygen Discharge (Final Oxygen Re-Evaluation):   Initial Exercise Prescription:     Initial Exercise Prescription - 09/30/16 1200      Date of Initial Exercise RX and Referring Provider   Date 09/30/16     Treadmill   MPH 1.4   Grade 0   Minutes 15   METs 2     NuStep   Level 2   Minutes 15   METs 2     Biostep-RELP   Level 2   Minutes 15   METs 2     Prescription Details   Frequency (times per week) 3   Duration Progress to 45 minutes of aerobic exercise without signs/symptoms of physical distress     Intensity   THRR 40-80% of Max Heartrate 93-120   Ratings of Perceived Exertion 11-13   Perceived Dyspnea 0-4     Resistance Training   Training Prescription Yes   Weight 3   Reps 10-12      Perform Capillary Blood Glucose checks as needed.  Exercise Prescription Changes:     Exercise Prescription Changes    Row Name 12/08/16 1200 12/11/16 1100 01/07/17 1100 01/21/17 1200 02/04/17 1100     Response to Exercise   Blood Pressure (Admit) 120/72 124/64 130/62 124/78 104/56   Blood Pressure (Exercise) 148/64 124/56 124/64 134/72 124/64   Blood Pressure (Exit) 118/66 106/60 126/70 114/68 108/66   Heart Rate (Admit) 69 bpm 83 bpm 66 bpm 64 bpm 66 bpm   Heart Rate (Exercise) 107 bpm 109 bpm 119 bpm 114 bpm 100 bpm   Heart Rate (Exit) 60 bpm 75 bpm 68 bpm 62 bpm 62 bpm   Oxygen Saturation (Admit) 98 % 100 % 97 % 98 % 97 %   Oxygen Saturation (Exercise) 97 % 99 % 96 % 99 % 100 %   Oxygen Saturation (Exit) 95 % 98 % 98 % 98 % 97 %   Rating of Perceived Exertion (Exercise) '11 13 13 12 12   '$ Perceived Dyspnea (Exercise) '1 1 2 2 2   '$ Duration  -- Progress to 45 minutes of aerobic exercise without signs/symptoms of physical distress Continue with  45 min of aerobic exercise without signs/symptoms of physical distress. Continue with 45 min of aerobic exercise without signs/symptoms of physical distress. Continue with 45 min of aerobic exercise without signs/symptoms of physical distress.   Intensity  -- THRR unchanged THRR unchanged THRR unchanged THRR unchanged     Progression   Progression  --  -- Continue to progress workloads to maintain intensity without signs/symptoms of physical distress. Continue to progress workloads to maintain intensity without signs/symptoms of physical distress. Continue to progress workloads to maintain intensity without signs/symptoms of physical distress.     Resistance Training   Training Prescription Yes Yes Yes Yes Yes   Weight '3 3 3 3 3   '$ Reps  --  --  --  10-15 10-15     Interval Training   Interval Training  -- No  --  --  --     Treadmill   MPH 1.4 1.7 2 2.1 2.2   Grade 0 0 0  -- 0.5   Minutes '15 15 15 15 15   '$ METs 2 2.3 2.53 2.61 2.84     NuStep   Level 2  -- 5 5  --   Minutes 15  -- 15 5  --   METs 2  -- 3.3  --  --     Biostep-RELP   Level '2 4 5 5 5   '$ Minutes '15 15 15 15 15   '$ METs 2 2.'2 3 2 3      '$ Exercise Comments:     Exercise Comments    Row Name 12/08/16 1226 12/10/16 1120 12/11/16 1141 12/17/16 1230 12/22/16 1106   Exercise Comments First full day of exercise!  Patient was oriented to gym and equipment including functions, settings, policies, and procedures.  Patient's individual exercise prescription and treatment plan were reviewed.  All starting workloads were established based on the results of the 6 minute walk test done at initial orientation visit.  The plan for exercise progression was also introduced and progression will be customized based on patient's performance and goals Discussed with Carloyn Manner about need for appropriate footwear and on getting fitted for proper tennis shoes. Trentin verbalized understanding about this need to wear appropriate footwear for exercise.  Marisol is  tolerating exercise very well in his first week. : Reviewed home exercise with pt today.  Pt plans to use weights and golf for exercise.  Reviewed THR, pulse, RPE, sign and symptoms, NTG use, and when to call 911 or MD.  Also discussed weather considerations and indoor options.  Pt voiced understanding. --   Row Name 01/07/17 1202 01/19/17 1227 01/21/17 1237 02/04/17 1156     Exercise Comments Sirr continues to progress with exercise adding intensity to all stations.  Patient did his mid 6 min walk assessment today. See data sheet for detailed report. Results and progression were discussed with the patient.   Marcos continues to tolerate exercise well. Josiah has added speed and incline to the TM and tolerates it well.       Exercise Goals and Review:   Exercise Goals Re-Evaluation :     Exercise Goals Re-Evaluation    Row Name 01/30/17 1126             Exercise Goal Re-Evaluation   Exercise Goals Review Increase Strenth and Stamina       Comments Jary hasn't felt like walking at home as much, but feels his ability to walk and breathe are improving.       Expected Outcomes Aleksa will continue to exercise and see improvement in his strength and stamina.          Discharge Exercise Prescription (Final Exercise Prescription Changes):     Exercise Prescription Changes - 02/04/17 1100      Response to Exercise   Blood Pressure (Admit) 104/56   Blood Pressure (Exercise) 124/64   Blood Pressure (Exit) 108/66   Heart Rate (Admit) 66 bpm   Heart Rate (Exercise) 100 bpm   Heart Rate (Exit) 62 bpm   Oxygen Saturation (Admit) 97 %   Oxygen Saturation (Exercise) 100 %   Oxygen Saturation (Exit) 97 %   Rating of Perceived Exertion (Exercise) 12   Perceived Dyspnea (Exercise) 2   Duration Continue  with 45 min of aerobic exercise without signs/symptoms of physical distress.   Intensity THRR unchanged     Progression   Progression Continue to progress workloads to maintain intensity without  signs/symptoms of physical distress.     Resistance Training   Training Prescription Yes   Weight 3   Reps 10-15     Treadmill   MPH 2.2   Grade 0.5   Minutes 15   METs 2.84     Biostep-RELP   Level 5   Minutes 15   METs 3      Nutrition:  Target Goals: Understanding of nutrition guidelines, daily intake of sodium '1500mg'$ , cholesterol '200mg'$ , calories 30% from fat and 7% or less from saturated fats, daily to have 5 or more servings of fruits and vegetables.  Biometrics:     Pre Biometrics - 09/30/16 1218      Pre Biometrics   Height '5\' 10"'$  (1.778 m)   Weight 213 lb (96.6 kg)   Waist Circumference 46 inches   Hip Circumference 47 inches   Waist to Hip Ratio 0.98 %   BMI (Calculated) 30.6       Nutrition Therapy Plan and Nutrition Goals:   Nutrition Discharge: Rate Your Plate Scores:   Nutrition Goals Re-Evaluation:   Nutrition Goals Discharge (Final Nutrition Goals Re-Evaluation):   Psychosocial: Target Goals: Acknowledge presence or absence of significant depression and/or stress, maximize coping skills, provide positive support system. Participant is able to verbalize types and ability to use techniques and skills needed for reducing stress and depression.   Initial Review & Psychosocial Screening:     Initial Psych Review & Screening - 09/30/16 [place]? Yes   Comments Mr Canter has good family support from his wife and children. 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  He did loss his one son to kidney cancer in April, but does not seem to be grieving at this time.     Barriers   Psychosocial barriers to participate in program The patient should benefit from training in stress management and relaxation.     Screening Interventions   Interventions Encouraged to exercise;Program counselor consult      Quality of Life Scores:     Quality of Life - 09/30/16 1228       Quality of Life Scores   Health/Function Pre 20.19 %   Socioeconomic Pre 19.94 %   Psych/Spiritual Pre 20.25 %   Family Pre 20.14 %   GLOBAL Pre 21 %      PHQ-9: Recent Review Flowsheet Data    Depression screen [facility] 2/9 09/30/2016   Decreased Interest 0   Down, Depressed, Hopeless 0   PHQ - 2 Score 0   Altered sleeping 0   Tired, decreased energy 1   Change in appetite 0   Feeling bad or failure about yourself  0   Trouble concentrating 0   Moving slowly or fidgety/restless 0   Suicidal thoughts 0   PHQ-9 Score 1     Interpretation of Total Score  Total Score Depression Severity:  1-4 = Minimal depression, 5-9 = Mild depression, 10-14 = Moderate depression, 15-19 = Moderately severe depression, 20-27 = Severe depression   Psychosocial Evaluation and Intervention:     Psychosocial Evaluation - 12/08/16 1111      Psychosocial Evaluation & Interventions   Interventions Encouraged to exercise with the program and  follow exercise prescription;Stress management education   Comments Counselor met with Mr. Mcgrady Carloyn Manner) today for initial psychosocial evaluation.  He is an 83 year old who, in addition to a diagnosis of COPD, had a heart attack last August and then had subsequent surgeries for his heart and to remove a kidney stone through October, 2017.  He then became Sepsis in January following the removal of a stent for his kidneys.  so he has been through quite a lot in the past 6-9 months.  He has a strong support system with a spouse of almost 28 years and a daughter and son-in-law who live close by.  Syair has another son in the [place] area and a son who died last spring due to kidney cancer.  Jermarcus reports sleeping well and having a good appetite.  He denies a history of depression or anxiety or any current symptoms.  He states his primary stress currently is realizing he is unable to do the things he used to do.  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  He is realizing his limitations more due to his health and this is hard to accept.  Although Tamario states he is typically in a positive mood and is grateful to "be alive."  Jerimyah has goals to be better educated on his diagnoses and improve his quality of life and breathing.  Staff will be following with him throughout the course of this program.     Expected Outcomes Hines is expected to exercise consistently; improve his breathing and be better educated on his diagnoses and realistic expectations for him as a result.     Continue Psychosocial Services  Follow up required by staff      Psychosocial Re-Evaluation:     Psychosocial Re-Evaluation    Bryan Name 12/22/16 1049 12/31/16 1106 02/09/17 1026         Psychosocial Re-Evaluation   Comments Counselor follow up with Carloyn Manner today reporting he is feeling stronger overall and able to do more normal activities since coming into this program.  He states he is still unable to get up if he falls, which is rare, but other than that, he recognizes breathing better and increased core strength.  Counselor commended Carloyn Manner for his consistency and progress made in such a short time.  Staff will continue to follow with Carloyn Manner while in this program.  Counselor follow up with Carloyn Manner today stating he continues to feel stronger and is using hand weights at home daily.  Counselor commended Carloyn Manner for his progress made while in this program as he also reports he is breathing better as well.  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  Counselor commended Khadim on his positive choices and setting healthier more realistic limits.  He continues to have a strong support system and a positive mood.  Staff will continue to follow with Carloyn Manner throughout the course of this program.   Counselor follow up with  Carloyn Manner today reporting progress since coming into this program of breathing better and moving better.  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  He reports his stress level is down because his energy and ability have improved.  Rishan continues to sleep well and  have a good appetite.  He continues to have a strong support system and maintains a positive mood.  Ry has a treadmill and cycle machine at home, as well as hand weights which he plans to use  more frequently upon completion of this program.  Counselor commended Sabrina on all his progress made and encouraged him to continue consistently exercising in the future.         Expected Outcomes  -- Seaver will continue to exercise consistently both at class and at home.  Deivi will continue to experience strength and stamina increase as a result.  Samba is making healthy choices for his cattle farm with setting more realistic limits.  Counselor commended Lisman for all his progress and healthy choices.   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  Izaha will complete this program and has plans to exercise using his home equipment.       Continue Psychosocial Services   -- Follow up required by staff Follow up required by staff        Psychosocial Discharge (Final Psychosocial Re-Evaluation):     Psychosocial Re-Evaluation - 02/09/17 1026      Psychosocial Re-Evaluation   Comments Counselor follow up with Carloyn Manner today reporting progress since coming into this program of breathing better and moving better.  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  He reports his stress level is down because his energy and ability have improved.  Kayzen continues to sleep well and have a good appetite.  He continues to have a strong support system and maintains a positive mood.  Rydan has a treadmill and cycle machine at home, as well as  hand weights which he plans to use  more frequently upon completion of this program.  Counselor commended Marin on all his progress made and encouraged him to continue consistently exercising in the future.       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  Brenner will complete this program and has plans to exercise using his home equipment.     Continue Psychosocial Services  Follow up required by staff      Education: Education Goals: Education classes will be provided on a weekly basis, covering required topics. Participant will state understanding/return demonstration of topics presented.  Learning Barriers/Preferences:     Learning Barriers/Preferences - 09/30/16 1208      Learning Barriers/Preferences   Learning Barriers None   Learning Preferences None      Education Topics: Initial Evaluation Education: - Verbal, written and demonstration of respiratory meds, RPE/PD scales, oximetry and breathing techniques. Instruction on use of nebulizers and MDIs: cleaning and proper use, rinsing mouth with steroid doses and importance of monitoring MDI activations.   Pulmonary Rehab from 02/11/2017 in [facility] Cardiac and Pulmonary Rehab  Date  09/30/16  Educator  LB  Instruction Review Code  2- meets goals/outcomes      General Nutrition Guidelines/Fats and Fiber: -Group instruction provided by verbal, written material, models and posters to present the general guidelines for heart healthy nutrition. Gives an explanation and review of dietary fats and fiber.   Pulmonary Rehab from 02/11/2017 in [facility] Cardiac and Pulmonary Rehab  Date  02/09/17  Educator  CR  Instruction Review Code  2- meets goals/outcomes      Controlling Sodium/Reading Food Labels: -  Group verbal and written material supporting the discussion of sodium use in heart healthy nutrition. Review and explanation with models, verbal and  written materials for utilization of the food label.   Pulmonary Rehab from 02/11/2017 in [facility] Cardiac and Pulmonary Rehab  Date  12/29/16  Educator  CR  Instruction Review Code  2- meets goals/outcomes      Exercise Physiology & Risk Factors: - Group verbal and written instruction with models to review the exercise physiology of the cardiovascular system and associated critical values. Details cardiovascular disease risk factors and the goals associated with each risk factor.   Aerobic Exercise & Resistance Training: - Gives group verbal and written discussion on the health impact of inactivity. On the components of aerobic and resistive training programs and the benefits of this training and how to safely progress through these programs.   Flexibility, Balance, General Exercise Guidelines: - Provides group verbal and written instruction on the benefits of flexibility and balance training programs. Provides general exercise guidelines with specific guidelines to those with heart or lung disease. Demonstration and skill practice provided.   Stress Management: - Provides group verbal and written instruction about the health risks of elevated stress, cause of high stress, and healthy ways to reduce stress.   Depression: - Provides group verbal and written instruction on the correlation between heart/lung disease and depressed mood, treatment options, and the stigmas associated with seeking treatment.   Pulmonary Rehab from 02/11/2017 in [facility] Cardiac and Pulmonary Rehab  Date  12/17/16  Educator  [facility]  Instruction Review Code  2- meets goals/outcomes      Exercise & Equipment Safety: - Individual verbal instruction and demonstration of equipment use and safety with use of the equipment.   Pulmonary Rehab from 02/11/2017 in [facility] Cardiac and Pulmonary Rehab  Date  12/08/16  Educator  AS  Instruction Review Code  2- meets goals/outcomes      Infection Prevention: - Provides verbal and  written material to individual with discussion of infection control including proper hand washing and proper equipment cleaning during exercise session.   Pulmonary Rehab from 02/11/2017 in [facility] Cardiac and Pulmonary Rehab  Date  12/08/16  Educator  AS  Instruction Review Code  2- meets goals/outcomes      Falls Prevention: - Provides verbal and written material to individual with discussion of falls prevention and safety.   Pulmonary Rehab from 02/11/2017 in [facility] Cardiac and Pulmonary Rehab  Date  12/08/16  Educator  AS  Instruction Review Code  2- meets goals/outcomes      Diabetes: - Individual verbal and written instruction to review signs/symptoms of diabetes, desired ranges of glucose level fasting, after meals and with exercise. Advice that pre and post exercise glucose checks will be done for 3 sessions at entry of program.   Pulmonary Rehab from 02/11/2017 in [facility] Cardiac and Pulmonary Rehab  Date  01/30/17 [facility] Your Numbers]  Educator  CE  Instruction Review Code  2- meets goals/outcomes      Chronic Lung Diseases: - Group verbal and written instruction to review new updates, new respiratory medications, new advancements in procedures and treatments. Provide informative websites and "800" numbers of self-education.   Pulmonary Rehab from 02/11/2017 in [facility] Cardiac and Pulmonary Rehab  Date  01/21/17  Educator  LB  Instruction Review Code  2- meets goals/outcomes      Lung Procedures: - Group verbal and written instruction to describe testing methods done to diagnose lung disease. Review the outcome  of test results. Describe the treatment choices: Pulmonary Function Tests, ABGs and oximetry.   Energy Conservation: - Provide group verbal and written instruction for methods to conserve energy, plan and organize activities. Instruct on pacing techniques, use of adaptive equipment and posture/positioning to relieve shortness of breath.   Pulmonary Rehab from 02/11/2017 in [facility]  Cardiac and Pulmonary Rehab  Date  12/10/16  Educator  [facility]  Instruction Review Code  2- meets goals/outcomes      Triggers: - Group verbal and written instruction to review types of environmental controls: home humidity, furnaces, filters, dust mite/pet prevention, HEPA vacuums. To discuss weather changes, air quality and the benefits of nasal washing.   Pulmonary Rehab from 02/11/2017 in [facility] Cardiac and Pulmonary Rehab  Date  02/04/17  Educator  LB  Instruction Review Code  2- meets goals/outcomes      Exacerbations: - Group verbal and written instruction to provide: warning signs, infection symptoms, calling MD promptly, preventive modes, and value of vaccinations. Review: effective airway clearance, coughing and/or vibration techniques. Create an Sports administrator.   Pulmonary Rehab from 02/11/2017 in [facility] Cardiac and Pulmonary Rehab  Date  01/28/17  Educator  LB  Instruction Review Code  2- meets goals/outcomes      Oxygen: - Individual and group verbal and written instruction on oxygen therapy. Includes supplement oxygen, available portable oxygen systems, continuous and intermittent flow rates, oxygen safety, concentrators, and Medicare reimbursement for oxygen.   Respiratory Medications: - Group verbal and written instruction to review medications for lung disease. Drug class, frequency, complications, importance of spacers, rinsing mouth after steroid MDI's, and proper cleaning methods for nebulizers.   Pulmonary Rehab from 02/11/2017 in [facility] Cardiac and Pulmonary Rehab  Date  09/30/16  Educator  LB  Instruction Review Code  2- meets goals/outcomes      AED/CPR: - Group verbal and written instruction with the use of models to demonstrate the basic use of the AED with the basic ABC's of resuscitation.   Pulmonary Rehab from 02/11/2017 in [facility] Cardiac and Pulmonary Rehab  Date  02/06/17  Educator  MA  Instruction Review Code  2- meets goals/outcomes      Breathing  Retraining: - Provides individuals verbal and written instruction on purpose, frequency, and proper technique of diaphragmatic breathing and pursed-lipped breathing. Applies individual practice skills.   Anatomy and Physiology of the Lungs: - Group verbal and written instruction with the use of models to provide basic lung anatomy and physiology related to function, structure and complications of lung disease.   Pulmonary Rehab from 02/11/2017 in [facility] Cardiac and Pulmonary Rehab  Date  01/02/17  Educator  LB  Instruction Review Code  2- meets goals/outcomes      Heart Failure: - Group verbal and written instruction on the basics of heart failure: signs/symptoms, treatments, explanation of ejection fraction, enlarged heart and cardiomyopathy.   Sleep Apnea: - Individual verbal and written instruction to review Obstructive Sleep Apnea. Review of risk factors, methods for diagnosing and types of masks and machines for OSA.   Anxiety: - Provides group, verbal and written instruction on the correlation between heart/lung disease and anxiety, treatment options, and management of anxiety.   Relaxation: - Provides group, verbal and written instruction about the benefits of relaxation for patients with heart/lung disease. Also provides patients with examples of relaxation techniques.   Pulmonary Rehab from 02/11/2017 in [facility] Cardiac and Pulmonary Rehab  Date  02/11/17  Educator  [facility]  Instruction Review Code  2-  Meets goals/outcomes      Knowledge Questionnaire Score:     Knowledge Questionnaire Score - 09/30/16 1208      Knowledge Questionnaire Score   Pre Score 5/10       Core Components/Risk Factors/Patient Goals at Admission:     Personal Goals and Risk Factors at Admission - 09/30/16 1213      Core Components/Risk Factors/Patient Goals on Admission   Sedentary Yes  [place] 6mns[place]   Intervention Provide advice, education, support and counseling about  physical activity/exercise needs.;Develop an individualized exercise prescription for aerobic and resistive training based on initial evaluation findings, risk stratification, comorbidities and participant's personal goals.   Expected Outcomes Achievement of increased cardiorespiratory fitness and enhanced flexibility, muscular endurance and strength shown through measurements of functional capacity and personal statement of participant.   Increase Strength and Stamina Yes  Home TM and Exercise bike; uses 2-3lb weights 90 reps several days/week   Intervention Provide advice, education, support and counseling about physical activity/exercise needs.;Develop an individualized exercise prescription for aerobic and resistive training based on initial evaluation findings, risk stratification, comorbidities and participant's personal goals.   Expected Outcomes Achievement of increased cardiorespiratory fitness and enhanced flexibility, muscular endurance and strength shown through measurements of functional capacity and personal statement of participant.   Develop more efficient breathing techniques such as purse lipped breathing and diaphragmatic breathing; and practicing self-pacing with activity Yes   Intervention Provide education, demonstration and support about specific breathing techniuqes utilized for more efficient breathing. Include techniques such as pursed lipped breathing, diaphragmatic breathing and self-pacing activity.   Expected Outcomes Short Term: Participant will be able to demonstrate and use breathing techniques as needed throughout daily activities.   Increase knowledge of respiratory medications and ability to use respiratory devices properly  Yes  ProAir with spacer; CPAP 12cmH2O, nasal mask   Intervention Provide education and demonstration as needed of appropriate use of medications, inhalers, and oxygen therapy.   Expected Outcomes Short Term: Achieves understanding of medications  use. Understands that oxygen is a medication prescribed by physician. Demonstrates appropriate use of inhaler and oxygen therapy.   Lipids Yes   Intervention Provide education and support for participant on nutrition & aerobic/resistive exercise along with prescribed medications to achieve LDL '70mg'$ , HDL >'40mg'$ .   Expected Outcomes Short Term: Participant states understanding of desired cholesterol values and is compliant with medications prescribed. Participant is following exercise prescription and nutrition guidelines.;Long Term: Cholesterol controlled with medications as prescribed, with individualized exercise RX and with personalized nutrition plan. Value goals: LDL < '70mg'$ , HDL > 40 mg.      Core Components/Risk Factors/Patient Goals Review:      Goals and Risk Factor Review    Row Name 12/08/16 1229 12/17/16 1535 12/31/16 1402         Core Components/Risk Factors/Patient Goals Review   Personal Goals Review Develop more efficient breathing techniques such as purse lipped breathing and diaphragmatic breathing and practicing self-pacing with activity. Weight Management/Obesity;Improve shortness of breath with ADL's;Develop more efficient breathing techniques such as purse lipped breathing and diaphragmatic breathing and practicing self-pacing with activity.;Increase knowledge of respiratory medications and ability to use respiratory devices properly.;Lipids Improve shortness of breath with ADL's;Increase knowledge of respiratory medications and ability to use respiratory devices properly.;Develop more efficient breathing techniques such as purse lipped breathing and diaphragmatic breathing and practicing self-pacing with activity.;Lipids     Review PLB was demonstrated and discussed with patient. He demonstrated understanding of how to  use PLB during exercise and daily activities to help control SOB.  Mr Mcfetridge has completed 5 sessions of [facility] and has progressed his exercise goals. He uses  his CPAP every night and has a good understanding of the importance of this therapy. I gave him a spacer for his ProAir and will instruct him on it's use in the next class. He does not use his ProAir frequently. He has good technique with his PLB and is qued to use the technique. Mr Repinski is enjooying the socilaization. Mr Bearse is progressing with his exercise goals. He also walks outside when the weather is nice or uses his treadmill indoors on the days he has off from [facility]. He has a good understanding of his CPAP and is very compliant every night. For his ProAir, I gave him a spacer with directions and cleaning instructions. He uses hand weights at home - arm curls. Mr Bhardwaj attends regularly and enjoys the education and socialization.     Expected Outcomes Patient will use PLB during exercise and daily activties to help control SOB and maintain oxygen saturations.  Continue to progress with his exercise goals and use PLB with exercise and with home activities. Continue progressing with his exercise and properly use the spacer with his Proair.        Core Components/Risk Factors/Patient Goals at Discharge (Final Review):      Goals and Risk Factor Review - 12/31/16 1402      Core Components/Risk Factors/Patient Goals Review   Personal Goals Review Improve shortness of breath with ADL's;Increase knowledge of respiratory medications and ability to use respiratory devices properly.;Develop more efficient breathing techniques such as purse lipped breathing and diaphragmatic breathing and practicing self-pacing with activity.;Lipids   Review Mr Tanori is progressing with his exercise goals. He also walks outside when the weather is nice or uses his treadmill indoors on the days he has off from [facility]. He has a good understanding of his CPAP and is very compliant every night. For his ProAir, I gave him a spacer with directions and cleaning instructions. He uses hand weights at home - arm curls. Mr Twaddle  attends regularly and enjoys the education and socialization.   Expected Outcomes Continue progressing with his exercise and properly use the spacer with his Proair.      ITP Comments:     ITP Comments    Row Name 10/10/16 1106 10/20/16 0855 11/18/16 1333 12/08/16 1532 02/16/17 0737   ITP Comments Returned call.  Cynthia is scheduled for a stent removal on Tuesday.  He is cleared to return from cardiac and kidney standpoint.  Spoke with Johnny Bridge about making sure that Dr. Evelene Croon provides clearance for Channing Mutters to start rehab after his stent removal. Called Mr Cotham to check on his condition from his hospitalization. I spoke with Mrs Cedillos who states Mr Baines will have 2 weeks of antibiotics through his IV line and will have home nursing and PT therapy for several weeks. Mr Ziemann does want to return to [facility] after clearance from his physician. Called Mr Walston - last attended 09/30/17 due to hospitalization. I spoke with Ms Boggus, and she states Mr Kempner is doing better - IV line removed, finished treatment from home RN's and PT's, and continuation of home exercises. He has an appointment 12/05/16 with Dr Johney Frame and will inquire for a clearance to return to [facility]. Mr Helzer brought in clearance to return to [facility].   30 day note review by Dr Bethann Punches, Medical  Director of [facility]      Comments:  30 day note review by Dr Emily Filbert, Medical Director of [place]

## 2017-02-16 NOTE — Progress Notes (Signed)
Daily Session Note  Patient Details  Name: Douglas Washington MRN: [id number] Date of Birth: [birth year] Referring Provider:    Encounter Date: 02/16/2017  Check In:     Session Check In - 02/16/17 1247      Check-In   Location [facility]-Cardiac & Pulmonary Rehab   Staff Present Nada Maclachlan, BA, ACSM CEP, Exercise Physiologist;Laureen Owens Shark, BS, RRT, Respiratory Bertis Ruddy, BS, ACSM CEP, Exercise Physiologist   Supervising physician immediately available to respond to emergencies [facility] immediately available ER MD   Physician(s) Burlene Arnt and Jimmye Norman    Medication changes reported     No   Fall or balance concerns reported    No   Warm-up and Cool-down Performed as group-led instruction   Resistance Training Performed Yes   VAD Patient? No     Pain Assessment   Currently in Pain? No/denies   Multiple Pain Sites No         History  Smoking Status  . Former Smoker  . Quit date: 04/14/1974  Smokeless Tobacco  . Former Systems developer  . Quit date: 04/14/1974    Goals Met:  Proper associated with RPD/PD & O2 Sat Independence with exercise equipment Exercise tolerated well Strength training completed today  Goals Unmet:  Not Applicable  Comments: Pt able to follow exercise prescription today without complaint.  Will continue to monitor for progression.    Dr. Emily Filbert is Medical Director for [place] and [facility].

## 2017-02-18 DIAGNOSIS — J45909 Unspecified asthma, uncomplicated: Secondary | ICD-10-CM | POA: Diagnosis not present

## 2017-02-18 NOTE — Progress Notes (Signed)
Daily Session Note  Patient Details  Name: Douglas Washington MRN: [id number] Date of Birth: 1929/05/25 Referring Provider:    Encounter Date: 02/18/2017  Check In:     Session Check In - 02/18/17 1300      Check-In   Location [facility]-Cardiac & Pulmonary Rehab   Staff Present Carson Myrtle, BS, RRT, Respiratory Lennie Hummer, MA, ACSM RCEP, Exercise Physiologist;Peggy Monk Oletta Darter, BA, ACSM CEP, Exercise Physiologist   Supervising physician immediately available to respond to emergencies [facility] immediately available ER MD   Physician(s) Alfred Levins and Jimmye Norman   Medication changes reported     No   Fall or balance concerns reported    No   Warm-up and Cool-down Performed as group-led instruction   Resistance Training Performed Yes   VAD Patient? No     Pain Assessment   Currently in Pain? No/denies           Exercise Prescription Changes - 02/18/17 1300      Response to Exercise   Blood Pressure (Admit) 114/60   Blood Pressure (Exercise) 124/64   Blood Pressure (Exit) 100/68   Heart Rate (Admit) 68 bpm   Heart Rate (Exercise) 114 bpm   Heart Rate (Exit) 93 bpm   Oxygen Saturation (Admit) 99 %   Oxygen Saturation (Exercise) 97 %   Oxygen Saturation (Exit) 96 %   Rating of Perceived Exertion (Exercise) 12   Perceived Dyspnea (Exercise) 2   Duration Continue with 45 min of aerobic exercise without signs/symptoms of physical distress.   Intensity THRR unchanged     Progression   Progression Continue to progress workloads to maintain intensity without signs/symptoms of physical distress.     Resistance Training   Training Prescription Yes   Weight 3   Reps 10-15     Interval Training   Interval Training Yes   Equipment NuStep     Treadmill   MPH 2.2   Grade 0.5   Minutes 15   METs 2.84     NuStep   Level 5   Minutes 15   METs 3.7      History  Smoking Status  . Former Smoker  . Quit date: 04/14/1974  Smokeless Tobacco  . Former Systems developer  . Quit date:  04/14/1974    Goals Met:  Proper associated with RPD/PD & O2 Sat Independence with exercise equipment Exercise tolerated well Strength training completed today  Goals Unmet:  Not Applicable  Comments:      [place] Name 09/30/16 1219 01/19/17 1223 02/18/17 1301     6 Minute Walk   Phase  - Mid Program  -   Distance 730 feet 1061 feet 1075 feet   Distance % Change  - 45 % 47 %   Walk Time 6 minutes 6 minutes 6 minutes   # of Rest Breaks 0 0 0   MPH 1.38 2 2.03   METS 2 2.53 2.53   RPE '13 12 12   ' Perceived Dyspnea  '3 2 2   ' VO2 Peak 3.85 6.47 6.3   Symptoms No No No   Resting HR 64 bpm 77 bpm 68 bpm   Resting BP 118/60 120/70 114/60   Max Ex. HR 125 bpm 118 bpm 114 bpm   Max Ex. BP 122/60 150/82 136/76     Interval HR   Baseline HR 64 77 68   1 Minute HR 103 105  -   2 Minute HR 120 117  -  3 Minute HR 115 115 114   4 Minute HR 122 117 113   5 Minute HR 125 116  -   6 Minute HR 116 118 100   2 Minute Post HR 99 90  -   Interval Heart Rate? Yes Yes  -     Interval Oxygen   Interval Oxygen? Yes Yes  -   Baseline Oxygen Saturation % 98 % 98 % 99 %   1 Minute Oxygen Saturation % 96 % 96 % 98 %   2 Minute Oxygen Saturation % 98 % 98 % 97 %   3 Minute Oxygen Saturation % 98 % 97 % 98 %   4 Minute Oxygen Saturation % 98 % 97 % 98 %   5 Minute Oxygen Saturation % 98 % 98 %  -   6 Minute Oxygen Saturation % 98 % 98 % 99 %   2 Minute Post Oxygen Saturation % 97 % 99 %  -        Dr. Emily Filbert is Medical Director for [place] and [facility].

## 2017-02-20 ENCOUNTER — Encounter: Payer: Medicare Other | Admitting: *Deleted

## 2017-02-20 DIAGNOSIS — J45909 Unspecified asthma, uncomplicated: Secondary | ICD-10-CM | POA: Diagnosis not present

## 2017-02-20 NOTE — Progress Notes (Signed)
Daily Session Note  Patient Details  Name: Douglas Washington MRN: [id number] Date of Birth: Jul 10, 1929 Referring Provider:    Encounter Date: 02/20/2017  Check In:     Session Check In - 02/20/17 1033      Check-In   Location [facility]-Cardiac & Pulmonary Rehab   Staff Present Heath Lark, RN, BSN, CCRP;Milind Raether, RN, Vickki Hearing, BA, ACSM CEP, Exercise Physiologist   Supervising physician immediately available to respond to emergencies See telemetry face sheet for immediately available ER MD   Medication changes reported     No   Fall or balance concerns reported    No   Tobacco Cessation No Change   Warm-up and Cool-down Performed as group-led instruction   Resistance Training Performed Yes   VAD Patient? No         History  Smoking Status  . Former Smoker  . Quit date: 04/14/1974  Smokeless Tobacco  . Former Systems developer  . Quit date: 04/14/1974    Goals Met:  Proper associated with RPD/PD & O2 Sat Exercise tolerated well  Goals Unmet:  Not Applicable  Comments:     Dr. Emily Filbert is Medical Director for [place] and [facility].

## 2017-02-23 ENCOUNTER — Encounter: Payer: Medicare Other | Admitting: *Deleted

## 2017-02-23 DIAGNOSIS — J45909 Unspecified asthma, uncomplicated: Secondary | ICD-10-CM

## 2017-02-23 NOTE — Progress Notes (Signed)
Daily Session Note  Patient Details  Name: Douglas Washington MRN: [id number] Date of Birth: [birth year] Referring Provider:    Encounter Date: 02/23/2017  Check In:     Session Check In - 02/23/17 1035      Check-In   Location [facility]-Cardiac & Pulmonary Rehab   Staff Present Carson Myrtle, BS, RRT, Respiratory Therapist;Cyrstal Leitz Amedeo Plenty, BS, ACSM CEP, Exercise Physiologist;Amanda Oletta Darter, BA, ACSM CEP, Exercise Physiologist   Supervising physician immediately available to respond to emergencies [facility] immediately available ER MD   Physician(s) Eual Fines and Corky Downs   Medication changes reported     No   Fall or balance concerns reported    No   Warm-up and Cool-down Performed as group-led instruction   Resistance Training Performed Yes   VAD Patient? No     Pain Assessment   Currently in Pain? No/denies   Multiple Pain Sites No         History  Smoking Status  . Former Smoker  . Quit date: 04/14/1974  Smokeless Tobacco  . Former Systems developer  . Quit date: 04/14/1974    Goals Met:  Proper associated with RPD/PD & O2 Sat Independence with exercise equipment Exercise tolerated well Strength training completed today  Goals Unmet:  Not Applicable  Comments: Pt able to follow exercise prescription today without complaint.  Will continue to monitor for progression.    Dr. Emily Filbert is Medical Director for [place] and [facility].

## 2017-02-25 DIAGNOSIS — J45909 Unspecified asthma, uncomplicated: Secondary | ICD-10-CM | POA: Diagnosis not present

## 2017-02-25 NOTE — Progress Notes (Signed)
Daily Session Note  Patient Details  Name: Douglas Washington MRN: [id number] Date of Birth: [birth year] Referring Provider:    Encounter Date: 02/25/2017  Check In:     Session Check In - 02/25/17 1216      Check-In   Location [facility]-Cardiac & Pulmonary Rehab   Staff Present Carson Myrtle, BS, RRT, Respiratory Lennie Hummer, MA, ACSM RCEP, Exercise Physiologist;Glorianne Proctor Oletta Darter, BA, ACSM CEP, Exercise Physiologist   Supervising physician immediately available to respond to emergencies [facility] immediately available ER MD   Physician(s) Alfred Levins and R[place]bark   Medication changes reported     No   Fall or balance concerns reported    No   Warm-up and Cool-down Performed as group-led instruction   Resistance Training Performed Yes   VAD Patient? No     Pain Assessment   Currently in Pain? No/denies         History  Smoking Status  . Former Smoker  . Quit date: 04/14/1974  Smokeless Tobacco  . Former Systems developer  . Quit date: 04/14/1974    Goals Met:  Proper associated with RPD/PD & O2 Sat Independence with exercise equipment Exercise tolerated well Strength training completed today  Goals Unmet:  Not Applicable  Comments: Pt able to follow exercise prescription today without complaint.  Will continue to monitor for progression.    Dr. Emily Filbert is Medical Director for [place] and [facility].

## 2017-02-27 DIAGNOSIS — J45909 Unspecified asthma, uncomplicated: Secondary | ICD-10-CM

## 2017-02-27 NOTE — Progress Notes (Signed)
Daily Session Note  Patient Details  Name: PAETON STUDER MRN: [id number] Date of Birth: 11/08/1928 Referring Provider:    Encounter Date: 02/27/2017  Check In:     Session Check In - 02/27/17 1120      Check-In   Location [facility]-Cardiac & Pulmonary Rehab   Staff Present Gerlene Burdock, RN, Vickki Hearing, BA, ACSM CEP, Exercise Physiologist;Susanne Bice, RN, BSN, [employer] physician immediately available to respond to emergencies [facility] immediately available ER MD   Physician(s) Reita Cliche and Event organiser   Medication changes reported     No   Fall or balance concerns reported    No   Warm-up and Cool-down Performed as group-led Location manager Performed Yes   VAD Patient? No     Pain Assessment   Currently in Pain? No/denies         History  Smoking Status  . Former Smoker  . Quit date: 04/14/1974  Smokeless Tobacco  . Former Systems developer  . Quit date: 04/14/1974    Goals Met:  Proper associated with RPD/PD & O2 Sat Independence with exercise equipment Exercise tolerated well Strength training completed today  Goals Unmet:  Not Applicable  Comments: Pt able to follow exercise prescription today without complaint.  Will continue to monitor for progression.    Dr. Emily Filbert is Medical Director for [place] and [facility].

## 2017-03-02 ENCOUNTER — Encounter: Payer: Medicare Other | Admitting: *Deleted

## 2017-03-02 DIAGNOSIS — J45909 Unspecified asthma, uncomplicated: Secondary | ICD-10-CM

## 2017-03-02 NOTE — Patient Instructions (Signed)
Discharge Instructions  Patient Details  Name: Douglas Washington MRN: [id number] Date of Birth: 26-Mar-1929 Referring Provider:  Hillis Range, MD   Number of Visits: 17  Reason for Discharge:  Patient reached a stable level of exercise. Patient independent in their exercise.  Smoking History:  History  Smoking Status   Former Smoker   Quit date: 04/14/1974  Smokeless Tobacco   Former User   Quit date: 04/14/1974    Diagnosis:  Asthma, unspecified asthma severity, unspecified whether complicated, unspecified whether persistent  Initial Exercise Prescription:     Initial Exercise Prescription - 09/30/16 1200      Date of Initial Exercise RX and Referring Provider   Date 09/30/16     Treadmill   MPH 1.4   Grade 0   Minutes 15   METs 2     NuStep   Level 2   Minutes 15   METs 2     Biostep-RELP   Level 2   Minutes 15   METs 2     Prescription Details   Frequency (times per week) 3   Duration Progress to 45 minutes of aerobic exercise without signs/symptoms of physical distress     Intensity   THRR 40-80% of Max Heartrate 93-120   Ratings of Perceived Exertion 11-13   Perceived Dyspnea 0-4     Resistance Training   Training Prescription Yes   Weight 3   Reps 10-12      Discharge Exercise Prescription (Final Exercise Prescription Changes):     Exercise Prescription Changes - 02/18/17 1300      Response to Exercise   Blood Pressure (Admit) 114/60   Blood Pressure (Exercise) 124/64   Blood Pressure (Exit) 100/68   Heart Rate (Admit) 68 bpm   Heart Rate (Exercise) 114 bpm   Heart Rate (Exit) 93 bpm   Oxygen Saturation (Admit) 99 %   Oxygen Saturation (Exercise) 97 %   Oxygen Saturation (Exit) 96 %   Rating of Perceived Exertion (Exercise) 12   Perceived Dyspnea (Exercise) 2   Duration Continue with 45 min of aerobic exercise without signs/symptoms of physical distress.   Intensity THRR unchanged     Progression   Progression Continue to progress  workloads to maintain intensity without signs/symptoms of physical distress.     Resistance Training   Training Prescription Yes   Weight 3   Reps 10-15     Interval Training   Interval Training Yes   Equipment NuStep     Treadmill   MPH 2.2   Grade 0.5   Minutes 15   METs 2.84     NuStep   Level 5   Minutes 15   METs 3.7      Functional Capacity:     6 Minute Walk    Row Name 09/30/16 1219 01/19/17 1223 02/18/17 1301     6 Minute Walk   Phase  -- Mid Program  --   Distance 730 feet 1061 feet 1075 feet   Distance % Change  -- 45 % 47 %   Walk Time 6 minutes 6 minutes 6 minutes   # of Rest Breaks 0 0 0   MPH 1.38 2 2.03   METS 2 2.53 2.53   RPE 13 12 12    Perceived Dyspnea  3 2 2    VO2 Peak 3.85 6.47 6.3   Symptoms No No No   Resting HR 64 bpm 77 bpm 68 bpm   Resting BP 118/60 120/70 114/60  Max Ex. HR 125 bpm 118 bpm 114 bpm   Max Ex. BP 122/60 150/82 136/76     Interval HR   Baseline HR 64 77 68   1 Minute HR 103 105  --   2 Minute HR 120 117  --   3 Minute HR 115 115 114   4 Minute HR 122 117 113   5 Minute HR 125 116  --   6 Minute HR 116 118 100   2 Minute Post HR 99 90  --   Interval Heart Rate? Yes Yes  --     Interval Oxygen   Interval Oxygen? Yes Yes  --   Baseline Oxygen Saturation % 98 % 98 % 99 %   1 Minute Oxygen Saturation % 96 % 96 % 98 %   2 Minute Oxygen Saturation % 98 % 98 % 97 %   3 Minute Oxygen Saturation % 98 % 97 % 98 %   4 Minute Oxygen Saturation % 98 % 97 % 98 %   5 Minute Oxygen Saturation % 98 % 98 %  --   6 Minute Oxygen Saturation % 98 % 98 % 99 %   2 Minute Post Oxygen Saturation % 97 % 99 %  --      Quality of Life:     Quality of Life - 02/24/17 0706      Quality of Life Scores   Health/Function Pre 19.94 %   Health/Function Post 19.88 %   Health/Function % Change -0.3 %   Socioeconomic Pre 20.25 %   Socioeconomic Post 20.14 %   Socioeconomic % Change  -0.54 %   Psych/Spiritual Pre 20.14 %    Psych/Spiritual Post 20.14 %   Psych/Spiritual % Change 0 %   Family Pre 21 %   Family Post 20.2 %   Family % Change -3.81 %   GLOBAL Pre 20.19 %   GLOBAL Post 20.03 %   GLOBAL % Change -0.79 %      Personal Goals: Goals established at orientation with interventions provided to work toward goal.     Personal Goals and Risk Factors at Admission - 09/30/16 1213      Core Components/Risk Factors/Patient Goals on Admission   Sedentary Yes  Walks on farm at least 86mins/day   Intervention Provide advice, education, support and counseling about physical activity/exercise needs.;Develop an individualized exercise prescription for aerobic and resistive training based on initial evaluation findings, risk stratification, comorbidities and participant's personal goals.   Expected Outcomes Achievement of increased cardiorespiratory fitness and enhanced flexibility, muscular endurance and strength shown through measurements of functional capacity and personal statement of participant.   Increase Strength and Stamina Yes  Home TM and Exercise bike; uses 2-3lb weights 90 reps several days/week   Intervention Provide advice, education, support and counseling about physical activity/exercise needs.;Develop an individualized exercise prescription for aerobic and resistive training based on initial evaluation findings, risk stratification, comorbidities and participant's personal goals.   Expected Outcomes Achievement of increased cardiorespiratory fitness and enhanced flexibility, muscular endurance and strength shown through measurements of functional capacity and personal statement of participant.   Develop more efficient breathing techniques such as purse lipped breathing and diaphragmatic breathing; and practicing self-pacing with activity Yes   Intervention Provide education, demonstration and support about specific breathing techniuqes utilized for more efficient breathing. Include techniques such as  pursed lipped breathing, diaphragmatic breathing and self-pacing activity.   Expected Outcomes Short Term: Participant will be able to  demonstrate and use breathing techniques as needed throughout daily activities.   Increase knowledge of respiratory medications and ability to use respiratory devices properly  Yes  ProAir with spacer; CPAP 12cmH2O, nasal mask   Intervention Provide education and demonstration as needed of appropriate use of medications, inhalers, and oxygen therapy.   Expected Outcomes Short Term: Achieves understanding of medications use. Understands that oxygen is a medication prescribed by physician. Demonstrates appropriate use of inhaler and oxygen therapy.   Lipids Yes   Intervention Provide education and support for participant on nutrition & aerobic/resistive exercise along with prescribed medications to achieve LDL 70mg , HDL >40mg .   Expected Outcomes Short Term: Participant states understanding of desired cholesterol values and is compliant with medications prescribed. Participant is following exercise prescription and nutrition guidelines.;Long Term: Cholesterol controlled with medications as prescribed, with individualized exercise RX and with personalized nutrition plan. Value goals: LDL < 70mg , HDL > 40 mg.       Personal Goals Discharge:     Goals and Risk Factor Review - 02/24/17 0709      Core Components/Risk Factors/Patient Goals Review   Personal Goals Review Improve shortness of breath with ADL's;Increase knowledge of respiratory medications and ability to use respiratory devices properly.;Develop more efficient breathing techniques such as purse lipped breathing and diaphragmatic breathing and practicing self-pacing with activity.   Review Mr Hammerschmidt has a few more sessions before [facility] graduation. He has progressed with his exercise goals and state his stamina and endurance has improved since starting [facility]. 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 Mr Azam has learned to manage his shortness of breath with PLB and pacing. He has a good understanding of his ProAir and uses the spacer. Being very compliant with his nasal CPAP , Mr Chaffin knows his pressure of 12cmH2O and how to take care of the equipment. Mr Otting is commended for his regular attendence with  exercise and education.   Expected Outcomes Continue his regular exercise with walks on the farm and with his home equipment. Continue self-managing his Asthma and CHF as learned from the [facility] education.      Nutrition & Weight - Outcomes:     Pre Biometrics - 09/30/16 1218      Pre Biometrics   Height 5\' 10"  (1.778 m)   Weight 213 lb (96.6 kg)   Waist Circumference 46 inches   Hip Circumference 47 inches   Waist to Hip Ratio 0.98 %   BMI (Calculated) 30.6       Nutrition:   Nutrition Discharge:   Education Questionnaire Score:     Knowledge Questionnaire Score - 02/24/17 0706      Knowledge Questionnaire Score   Post Score 10/10      Goals reviewed with patient; copy given to patient.

## 2017-03-02 NOTE — Progress Notes (Signed)
Daily Session Note  Patient Details  Name: Douglas Washington MRN: [id number] Date of Birth: 06/01/1929 Referring Provider:    Encounter Date: 03/02/2017  Check In:     Session Check In - 03/02/17 1016      Check-In   Location [facility]   Staff Present Kelly Hayes, BS, ACSM CEP, Exercise Physiologist;Amanda Sommer, BA, ACSM CEP, Exercise Physiologist;Laureen Brown, BS, RRT, Respiratory Therapist   Supervising physician immediately available to respond to emergencies [facility] immediately available ER MD   Physician(s) Williams and Kinner   Medication changes reported     No   Fall or balance concerns reported    No   Warm-up and Cool-down Performed as group-led instruction   Resistance Training Performed Yes   VAD Patient? No     Pain Assessment   Currently in Pain? No/denies   Multiple Pain Sites No         History  Smoking Status  . Former Smoker  . Quit date: 04/14/1974  Smokeless Tobacco  . Former User  . Quit date: 04/14/1974    Goals Met:  Proper associated with RPD/PD & O2 Sat Independence with exercise equipment Exercise tolerated well Strength training completed today  Goals Unmet:  Not Applicable  Comments: Pt able to follow exercise prescription today without complaint.  Will continue to monitor for progression.    Dr. Mark Miller is Medical Director for [facility] and [facility]. 

## 2017-03-04 DIAGNOSIS — J45909 Unspecified asthma, uncomplicated: Secondary | ICD-10-CM | POA: Diagnosis not present

## 2017-03-04 NOTE — Progress Notes (Signed)
Daily Session Note  Patient Details  Name: Douglas Washington MRN: [id number] Date of Birth: [birth year] Referring Provider:    Encounter Date: 03/04/2017  Check In:     Session Check In - 03/04/17 1201      Check-In   Location [facility]   Staff Present Carson Myrtle, BS, RRT, Respiratory Lennie Hummer, MA, ACSM RCEP, Exercise Physiologist;Casson Catena Oletta Darter, BA, ACSM CEP, Exercise Physiologist   Supervising physician immediately available to respond to emergencies [facility] immediately available ER MD   Physician(s) Joni Fears and Jimmye Norman   Medication changes reported     No   Fall or balance concerns reported    No   Warm-up and Cool-down Performed as group-led instruction   Resistance Training Performed Yes   VAD Patient? No         History  Smoking Status  . Former Smoker  . Quit date: 04/14/1974  Smokeless Tobacco  . Former Systems developer  . Quit date: 04/14/1974    Goals Met:  Proper associated with RPD/PD & O2 Sat Independence with exercise equipment Exercise tolerated well Strength training completed today  Goals Unmet:  Not Applicable  Comments: Pt able to follow exercise prescription today without complaint.  Will continue to monitor for progression.    Dr. Emily Filbert is Medical Director for [place] and [facility].

## 2017-03-04 NOTE — Progress Notes (Signed)
Pulmonary Individual Treatment Plan  Patient Details  Name: Douglas Washington MRN: [id number] Date of Birth: 17-Jul-1929 Referring Provider:    Initial Encounter Date:    Pulmonary Rehab from 09/30/2016 in [facility] Cardiac and Pulmonary Rehab  Date  09/30/16      Visit Diagnosis: Asthma, unspecified asthma severity, unspecified whether complicated, unspecified whether persistent  Patient's Home Medications on Admission:  Current Outpatient Prescriptions:    albuterol (PROVENTIL HFA;VENTOLIN HFA) 108 (90 BASE) MCG/ACT inhaler, Inhale 2 puffs into the lungs every 6 (six) hours as needed for wheezing or shortness of breath., Disp: , Rfl:    amiodarone (PACERONE) 200 MG tablet, Take 0.5 tablets (100 mg total) by mouth daily., Disp: 15 tablet, Rfl: 6   apixaban (ELIQUIS) 5 MG TABS tablet, Take 1 tab (5 mg)  Twice a day, Disp: 60 tablet, Rfl: 4   brimonidine (ALPHAGAN) 0.2 % ophthalmic solution, Place 1 drop into the left eye 2 (two) times daily., Disp: , Rfl: 5   carvedilol (COREG) 3.125 MG tablet, Take 1 tablet (3.125 mg total) by mouth 2 (two) times daily with a meal., Disp: 180 tablet, Rfl: 3   cetirizine (ZYRTEC) 10 MG tablet, Take 10 mg by mouth daily as needed for allergies. , Disp: , Rfl:    dorzolamide (TRUSOPT) 2 % ophthalmic solution, Place 1 drop into both eyes 2 (two) times daily., Disp: , Rfl:    erythromycin ophthalmic ointment, Place 1 application into both eyes at bedtime. , Disp: , Rfl: 3   Glucosamine-Chondroit-Vit C-Mn (GLUCOSAMINE 1500 COMPLEX) CAPS, Take 1 capsule by mouth daily., Disp: , Rfl:    latanoprost (XALATAN) 0.005 % ophthalmic solution, Place 1 drop into both eyes at bedtime., Disp: , Rfl:    MEGARED OMEGA-3 KRILL OIL 500 MG CAPS, Take 500 mg by mouth daily. , Disp: , Rfl:    montelukast (SINGULAIR) 10 MG tablet, Take 10 mg by mouth at bedtime., Disp: , Rfl:    Multiple Vitamins-Minerals (MULTIVITAMIN WITH MINERALS) tablet, Take 1 tablet by mouth daily., Disp:  , Rfl:    timolol (TIMOPTIC) 0.5 % ophthalmic solution, Place 1 drop into the left eye 2 (two) times daily., Disp: , Rfl: 4   valACYclovir (VALTREX) 500 MG tablet, Take 500 mg by mouth daily., Disp: , Rfl:   Past Medical History: Past Medical History:  Diagnosis Date   Allergy    Asthma    CHF (congestive heart failure) ([place])    10/17 WATCHMAN FILTER IMPLANTED   Chronic kidney disease    Kidney stones   COPD (chronic obstructive pulmonary disease) ([place])    Dysrhythmia    WATCHMAN FILTER 10/17   Glaucoma (increased eye pressure)    Hearing loss    History of shingles 4 plus years ago   set in left eye   Hypertension    OSA on CPAP    Persistent atrial fibrillation ([place]) 06/05/2016   Presence of permanent cardiac pacemaker    2010   Renal insufficiency    Sebaceous cyst    Upper back   Shortness of breath dyspnea    with exertion   Sinus bradycardia    s/p MDT PPM    Traumatic brain injury ([place]) 01/2005   Ventricular tachycardia ([place])    05/2016 requiring DCCV - started on amiodarone     Tobacco Use: History  Smoking Status   Former Smoker   Quit date: 04/14/1974  Smokeless Tobacco   Former Systems developer   Quit date: 04/14/1974  Labs: Recent Review Flowsheet Data    Labs for ITP Cardiac and Pulmonary Rehab Latest Ref Rng & Units 06/05/2016 06/05/2016 06/05/2016   Hemoglobin A1c 4.8 - 5.6 % - - 6.2(H)   HCO3 20.0 - 24.0 mEq/L 15.9(L) - -   TCO2 0 - 100 mmol/L 17 18 -   ACIDBASEDEF 0.0 - 2.0 mmol/L 10.0(H) - -   O2SAT % 94.0 - -       ADL UCSD:     Pulmonary Assessment Scores    Row Name 09/30/16 1209 01/21/17 1601 02/24/17 0705     ADL UCSD   ADL Phase Entry Mid Exit   SOB Score total _0 Rest 0 0 0   Walk _1 Stairs _2 Bath 0 0 0   Dress _3 Shop 1 0 0      Pulmonary Function Assessment:     Pulmonary Function Assessment - 09/30/16 1208      Pulmonary Function Tests   FVC% 65 %   FEV1% 79 %   FEV1/FVC  Ratio 87.43     Initial Spirometry Results   Comments Test performed 09/30/16     Breath   Bilateral Breath Sounds Clear   Shortness of Breath No      Exercise Target Goals:    Exercise Program Goal: Individual exercise prescription set with THRR, safety & activity barriers. Participant demonstrates ability to understand and report RPE using BORG scale, to self-measure pulse accurately, and to acknowledge the importance of the exercise prescription.  Exercise Prescription Goal: Starting with aerobic activity 30 plus minutes a day, 3 days per week for initial exercise prescription. Provide home exercise prescription and guidelines that participant acknowledges understanding prior to discharge.  Activity Barriers & Risk Stratification:     Activity Barriers & Cardiac Risk Stratification - 09/30/16 1203      Activity Barriers & Cardiac Risk Stratification   Activity Barriers Other (comment)   Comments Pacemaker,  atrial fib   Cardiac Risk Stratification Moderate      6 Minute Walk:     6 Minute Walk    Row Name 09/30/16 1219 01/19/17 1223 02/18/17 1301     6 Minute Walk   Phase  -- Mid Program  --   Distance 730 feet 1061 feet 1075 feet   Distance % Change  -- 45 % 47 %   Walk Time 6 minutes 6 minutes 6 minutes   # of Rest Breaks 0 0 0   MPH 1.38 2 2.03   METS 2 2.53 2.53   RPE _4 Perceived Dyspnea  _5 VO2 Peak 3.85 6.47 6.3   Symptoms No No No   Resting HR 64 bpm 77 bpm 68 bpm   Resting BP 118/60 120/70 114/60   Max Ex. HR 125 bpm 118 bpm 114 bpm   Max Ex. BP 122/60 150/82 136/76     Interval HR   Baseline HR 64 77 68   1 Minute HR 103 105  --   2 Minute HR 120 117  --   3 Minute HR 115 115 114   4 Minute HR 122 117 113   5 Minute HR 125 116  --   6 Minute HR 116 118 100   2 Minute Post HR 99 90  --   Interval Heart Rate? Yes Yes  --  Interval Oxygen   Interval Oxygen? Yes Yes  --   Baseline Oxygen Saturation % 98 % 98 % 99 %   1  Minute Oxygen Saturation % 96 % 96 % 98 %   2 Minute Oxygen Saturation % 98 % 98 % 97 %   3 Minute Oxygen Saturation % 98 % 97 % 98 %   4 Minute Oxygen Saturation % 98 % 97 % 98 %   5 Minute Oxygen Saturation % 98 % 98 %  --   6 Minute Oxygen Saturation % 98 % 98 % 99 %   2 Minute Post Oxygen Saturation % 97 % 99 %  --     Oxygen Initial Assessment:   Oxygen Re-Evaluation:   Oxygen Discharge (Final Oxygen Re-Evaluation):   Initial Exercise Prescription:     Initial Exercise Prescription - 09/30/16 1200      Date of Initial Exercise RX and Referring Provider   Date 09/30/16     Treadmill   MPH 1.4   Grade 0   Minutes 15   METs 2     NuStep   Level 2   Minutes 15   METs 2     Biostep-RELP   Level 2   Minutes 15   METs 2     Prescription Details   Frequency (times per week) 3   Duration Progress to 45 minutes of aerobic exercise without signs/symptoms of physical distress     Intensity   THRR 40-80% of Max Heartrate 93-120   Ratings of Perceived Exertion 11-13   Perceived Dyspnea 0-4     Resistance Training   Training Prescription Yes   Weight 3   Reps 10-12      Perform Capillary Blood Glucose checks as needed.  Exercise Prescription Changes:     Exercise Prescription Changes    Row Name 12/08/16 1200 12/11/16 1100 01/07/17 1100 01/21/17 1200 02/04/17 1100     Response to Exercise   Blood Pressure (Admit) 120/72 124/64 130/62 124/78 104/56   Blood Pressure (Exercise) 148/64 124/56 124/64 134/72 124/64   Blood Pressure (Exit) 118/66 106/60 126/70 114/68 108/66   Heart Rate (Admit) 69 bpm 83 bpm 66 bpm 64 bpm 66 bpm   Heart Rate (Exercise) 107 bpm 109 bpm 119 bpm 114 bpm 100 bpm   Heart Rate (Exit) 60 bpm 75 bpm 68 bpm 62 bpm 62 bpm   Oxygen Saturation (Admit) 98 % 100 % 97 % 98 % 97 %   Oxygen Saturation (Exercise) 97 % 99 % 96 % 99 % 100 %   Oxygen Saturation (Exit) 95 % 98 % 98 % 98 % 97 %   Rating of Perceived Exertion (Exercise) _0 Perceived Dyspnea (Exercise) _1 Duration  -- Progress to 45 minutes of aerobic exercise without signs/symptoms of physical distress Continue with 45 min of aerobic exercise without signs/symptoms of physical distress. Continue with 45 min of aerobic exercise without signs/symptoms of physical distress. Continue with 45 min of aerobic exercise without signs/symptoms of physical distress.   Intensity  -- THRR unchanged THRR unchanged THRR unchanged THRR unchanged     Progression   Progression  --  -- Continue to progress workloads to maintain intensity without signs/symptoms of physical distress. Continue to progress workloads to maintain intensity without signs/symptoms of physical distress. Continue to progress workloads to maintain intensity without signs/symptoms of physical distress.     Resistance Training  Training Prescription _0    Weight _1 Reps  --  --  -- 10-15 10-15     Interval Training   Interval Training  -- No  --  --  --     Treadmill   MPH 1.4 1.7 2 2.1 2.2   Grade 0 0 0  -- 0.5   Minutes _2 METs 2 2.3 2.53 2.61 2.84     NuStep   Level 2  -- 5 5  --   Minutes 15  -- 15 5  --   METs 2  -- 3.3  --  --     Biostep-RELP   Level _3 Minutes _4 METs 2 2._5 Row Name 02/18/17 1300 03/04/17 1200           Response to Exercise   Blood Pressure (Admit) 114/60 114/60      Blood Pressure (Exercise) 124/64 134/70      Blood Pressure (Exit) 100/68 134/64      Heart Rate (Admit) 68 bpm 70 bpm      Heart Rate (Exercise) 114 bpm 110 bpm      Heart Rate (Exit) 93 bpm 67 bpm      Oxygen Saturation (Admit) 99 % 99 %      Oxygen Saturation (Exercise) 97 % 94 %      Oxygen Saturation (Exit) 96 % 99 %      Rating of Perceived Exertion (Exercise) 12 12      Perceived Dyspnea (Exercise) 2 2      Duration Continue with 45 min of aerobic exercise without signs/symptoms of physical distress.  Continue with 45 min of aerobic exercise without signs/symptoms of physical distress.      Intensity THRR unchanged THRR unchanged        Progression   Progression Continue to progress workloads to maintain intensity without signs/symptoms of physical distress.  --        Resistance Training   Training Prescription Yes Yes      Weight 3 3      Reps 10-15  --        Interval Training   Interval Training Yes  --      Equipment NuStep  --        Treadmill   MPH 2.2 2.5      Grade 0.5 1      Minutes 15 15      METs 2.84 3.26        NuStep   Level 5 5      Minutes 15 15      METs 3.7 3.4        Biostep-RELP   Level  -- 5      Minutes  -- 15      METs  -- 3         Exercise Comments:     Exercise Comments    Row Name 12/08/16 1226 12/10/16 1120 12/11/16 1141 12/17/16 1230 12/22/16 1106   Exercise Comments First full day of exercise!  Patient was oriented to gym and equipment including functions, settings, policies, and procedures.  Patient's individual exercise prescription and treatment plan were reviewed.  All starting workloads were established based on the results of the 6 minute walk test done at initial orientation visit.  The plan for  exercise progression was also introduced and progression will be customized based on patient's performance and goals Discussed with Carloyn Manner about need for appropriate footwear and on getting fitted for proper tennis shoes. Burrel verbalized understanding about this need to wear appropriate footwear for exercise.  Derrik is tolerating exercise very well in his first week. : Reviewed home exercise with pt today.  Pt plans to use weights and golf for exercise.  Reviewed THR, pulse, RPE, sign and symptoms, NTG use, and when to call 911 or MD.  Also discussed weather considerations and indoor options.  Pt voiced understanding. --   Row Name 01/07/17 1202 01/19/17 1227 01/21/17 1237 02/04/17 1156 02/18/17 1308   Exercise Comments Raimundo continues to progress with  exercise adding intensity to all stations.  Patient did his mid 6 min walk assessment today. See data sheet for detailed report. Results and progression were discussed with the patient.   Mekai continues to tolerate exercise well. Nicolaus has added speed and incline to the TM and tolerates it well. Bralynn has tolerated intervals well on the NS.   Holden Name 03/04/17 1207           Exercise Comments Carloyn Manner graduates today and improved on all machines and his 6 min walk.  He plans to walk and do weights at home.          Exercise Goals and Review:   Exercise Goals Re-Evaluation :     Exercise Goals Re-Evaluation    Row Name 01/30/17 1126             Exercise Goal Re-Evaluation   Exercise Goals Review Increase Strenth and Stamina       Comments Izaak hasn't felt like walking at home as much, but feels his ability to walk and breathe are improving.       Expected Outcomes Alton will continue to exercise and see improvement in his strength and stamina.          Discharge Exercise Prescription (Final Exercise Prescription Changes):     Exercise Prescription Changes - 03/04/17 1200      Response to Exercise   Blood Pressure (Admit) 114/60   Blood Pressure (Exercise) 134/70   Blood Pressure (Exit) 134/64   Heart Rate (Admit) 70 bpm   Heart Rate (Exercise) 110 bpm   Heart Rate (Exit) 67 bpm   Oxygen Saturation (Admit) 99 %   Oxygen Saturation (Exercise) 94 %   Oxygen Saturation (Exit) 99 %   Rating of Perceived Exertion (Exercise) 12   Perceived Dyspnea (Exercise) 2   Duration Continue with 45 min of aerobic exercise without signs/symptoms of physical distress.   Intensity THRR unchanged     Resistance Training   Training Prescription Yes   Weight 3     Treadmill   MPH 2.5   Grade 1   Minutes 15   METs 3.26     NuStep   Level 5   Minutes 15   METs 3.4     Biostep-RELP   Level 5   Minutes 15   METs 3      Nutrition:  Target Goals: Understanding of nutrition guidelines,  daily intake of sodium '1500mg'$ , cholesterol '200mg'$ , calories 30% from fat and 7% or less from saturated fats, daily to have 5 or more servings of fruits and vegetables.  Biometrics:     Pre Biometrics - 09/30/16 1218      Pre Biometrics   Height '5\' 10"'$  (1.778 m)   Weight 213  lb (96.6 kg)   Waist Circumference 46 inches   Hip Circumference 47 inches   Waist to Hip Ratio 0.98 %   BMI (Calculated) 30.6       Nutrition Therapy Plan and Nutrition Goals:   Nutrition Discharge: Rate Your Plate Scores:   Nutrition Goals Re-Evaluation:   Nutrition Goals Discharge (Final Nutrition Goals Re-Evaluation):   Psychosocial: Target Goals: Acknowledge presence or absence of significant depression and/or stress, maximize coping skills, provide positive support system. Participant is able to verbalize types and ability to use techniques and skills needed for reducing stress and depression.   Initial Review & Psychosocial Screening:     Initial Psych Review & Screening - 09/30/16 Amanda? Yes   Comments Mr Vanvoorhis has good family support from his wife and children. 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  He did loss his one son to kidney cancer in April, but does not seem to be grieving at this time.     Barriers   Psychosocial barriers to participate in program The patient should benefit from training in stress management and relaxation.     Screening Interventions   Interventions Encouraged to exercise;Program counselor consult      Quality of Life Scores:     Quality of Life - 02/24/17 0706      Quality of Life Scores   Health/Function Pre 19.94 %   Health/Function Post 19.88 %   Health/Function % Change -0.3 %   Socioeconomic Pre 20.25 %   Socioeconomic Post 20.14 %   Socioeconomic % Change  -0.54 %   Psych/Spiritual Pre 20.14 %   Psych/Spiritual Post 20.14 %   Psych/Spiritual % Change 0 %    Family Pre 21 %   Family Post 20.2 %   Family % Change -3.81 %   GLOBAL Pre 20.19 %   GLOBAL Post 20.03 %   GLOBAL % Change -0.79 %      PHQ-9: Recent Review Flowsheet Data    Depression screen [facility] 2/9 02/24/2017 09/30/2016   Decreased Interest 0 0   Down, Depressed, Hopeless 0 0   PHQ - 2 Score 0 0   Altered sleeping 0 0   Tired, decreased energy 0 1   Change in appetite 0 0   Feeling bad or failure about yourself  0 0   Trouble concentrating 0 0   Moving slowly or fidgety/restless 0 0   Suicidal thoughts 0 0   PHQ-9 Score 0 1     Interpretation of Total Score  Total Score Depression Severity:  1-4 = Minimal depression, 5-9 = Mild depression, 10-14 = Moderate depression, 15-19 = Moderately severe depression, 20-27 = Severe depression   Psychosocial Evaluation and Intervention:     Psychosocial Evaluation - 02/23/17 1036      Discharge Psychosocial Assessment & Intervention   Comments Counselor met with Carloyn Manner today for his discharge evaluation.  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  He reports positive benefits in many ways but mostly that he is breathing better and no longer having to use his additional oxygen.  He plans to continue exercising at home with equipment there.  Counselor commended Johncarlo on his progress made and encouraged him to continue exercising consistently upon discharge.        Psychosocial Re-Evaluation:  Psychosocial Re-Evaluation    Row Name 12/22/16 1049 12/31/16 1106 02/09/17 1026         Psychosocial Re-Evaluation   Comments Counselor follow up with Carloyn Manner today reporting he is feeling stronger overall and able to do more normal activities since coming into this program.  He states he is still unable to get up if he falls, which is rare, but other than that, he recognizes breathing better and increased core strength.  Counselor commended Carloyn Manner for his consistency and progress made in such a short  time.  Staff will continue to follow with Carloyn Manner while in this program.  Counselor follow up with Carloyn Manner today stating he continues to feel stronger and is using hand weights at home daily.  Counselor commended Carloyn Manner for his progress made while in this program as he also reports he is breathing better as well.  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  Counselor commended Rudransh on his positive choices and setting healthier more realistic limits.  He continues to have a strong support system and a positive mood.  Staff will continue to follow with Carloyn Manner throughout the course of this program.   Counselor follow up with Carloyn Manner today reporting progress since coming into this program of breathing better and moving better.  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  He reports his stress level is down because his energy and ability have improved.  Denilson continues to sleep well and have a good appetite.  He continues to have a strong support system and maintains a positive mood.  Tilford has a treadmill and cycle machine at home, as well as hand weights which he plans to use  more frequently upon completion of this program.  Counselor commended Kalon on all his progress made and encouraged him to continue consistently exercising in the future.         Expected Outcomes  -- Nolyn will continue to exercise consistently both at class and at home.  Rufino will continue to experience strength and stamina increase as a result.  Yordin is making healthy choices for his cattle farm with setting more realistic limits.  Counselor commended Darby for all his progress and healthy choices.   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  Edras will complete this program and has plans to exercise  using his home equipment.       Continue Psychosocial Services   -- Follow up required by staff Follow up required by staff        Psychosocial Discharge (Final Psychosocial Re-Evaluation):     Psychosocial Re-Evaluation - 02/09/17 1026      Psychosocial Re-Evaluation   Comments Counselor follow up with Carloyn Manner today reporting progress since coming into this program of breathing better and moving better.  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  He reports his stress level is down because his energy and ability have improved.  Toney continues to sleep well and have a good appetite.  He continues to have a strong support system and maintains a positive mood.  Allister has a treadmill and cycle machine at home, as well as hand weights which he plans to use  more frequently  upon completion of this program.  Counselor commended Camdyn on all his progress made and encouraged him to continue consistently exercising in the future.       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  Muadh will complete this program and has plans to exercise using his home equipment.     Continue Psychosocial Services  Follow up required by staff      Education: Education Goals: Education classes will be provided on a weekly basis, covering required topics. Participant will state understanding/return demonstration of topics presented.  Learning Barriers/Preferences:     Learning Barriers/Preferences - 09/30/16 1208      Learning Barriers/Preferences   Learning Barriers None   Learning Preferences None      Education Topics: Initial Evaluation Education: - Verbal, written and demonstration of respiratory meds, RPE/PD scales, oximetry and breathing techniques. Instruction on use of nebulizers and MDIs: cleaning and proper use, rinsing mouth with steroid doses and importance of monitoring MDI  activations.   Pulmonary Rehab from 02/27/2017 in [facility] Cardiac and Pulmonary Rehab  Date  09/30/16  Educator  LB  Instruction Review Code  2- meets goals/outcomes      General Nutrition Guidelines/Fats and Fiber: -Group instruction provided by verbal, written material, models and posters to present the general guidelines for heart healthy nutrition. Gives an explanation and review of dietary fats and fiber.   Pulmonary Rehab from 02/27/2017 in [facility] Cardiac and Pulmonary Rehab  Date  02/09/17  Educator  CR  Instruction Review Code  2- meets goals/outcomes      Controlling Sodium/Reading Food Labels: -Group verbal and written material supporting the discussion of sodium use in heart healthy nutrition. Review and explanation with models, verbal and written materials for utilization of the food label.   Pulmonary Rehab from 02/27/2017 in [facility] Cardiac and Pulmonary Rehab  Date  02/16/17  Educator  CR  Instruction Review Code  2- meets goals/outcomes      Exercise Physiology & Risk Factors: - Group verbal and written instruction with models to review the exercise physiology of the cardiovascular system and associated critical values. Details cardiovascular disease risk factors and the goals associated with each risk factor.   Aerobic Exercise & Resistance Training: - Gives group verbal and written discussion on the health impact of inactivity. On the components of aerobic and resistive training programs and the benefits of this training and how to safely progress through these programs.   Pulmonary Rehab from 02/27/2017 in [facility] Cardiac and Pulmonary Rehab  Date  02/25/17  Educator  JH/AS  Instruction Review Code  2- meets goals/outcomes      Flexibility, Balance, General Exercise Guidelines: - Provides group verbal and written instruction on the benefits of flexibility and balance training programs. Provides general exercise guidelines with specific guidelines to those with heart or  lung disease. Demonstration and skill practice provided.   Stress Management: - Provides group verbal and written instruction about the health risks of elevated stress, cause of high stress, and healthy ways to reduce stress.   Depression: - Provides group verbal and written instruction on the correlation between heart/lung disease and depressed mood, treatment options, and the stigmas associated with seeking treatment.   Pulmonary Rehab from 02/27/2017 in [facility] Cardiac and Pulmonary Rehab  Date  12/17/16  Educator  [facility]  Instruction Review Code  2- meets goals/outcomes      Exercise & Equipment Safety: - Individual  verbal instruction and demonstration of equipment use and safety with use of the equipment.   Pulmonary Rehab from 02/27/2017 in [facility] Cardiac and Pulmonary Rehab  Date  12/08/16  Educator  AS  Instruction Review Code  2- meets goals/outcomes      Infection Prevention: - Provides verbal and written material to individual with discussion of infection control including proper hand washing and proper equipment cleaning during exercise session.   Pulmonary Rehab from 02/27/2017 in [facility] Cardiac and Pulmonary Rehab  Date  12/08/16  Educator  AS  Instruction Review Code  2- meets goals/outcomes      Falls Prevention: - Provides verbal and written material to individual with discussion of falls prevention and safety.   Pulmonary Rehab from 02/27/2017 in [facility] Cardiac and Pulmonary Rehab  Date  12/08/16  Educator  AS  Instruction Review Code  2- meets goals/outcomes      Diabetes: - Individual verbal and written instruction to review signs/symptoms of diabetes, desired ranges of glucose level fasting, after meals and with exercise. Advice that pre and post exercise glucose checks will be done for 3 sessions at entry of program.   Pulmonary Rehab from 02/27/2017 in [facility] Cardiac and Pulmonary Rehab  Date  01/30/17 [facility]]  Educator  CE  Instruction Review Code  2-  meets goals/outcomes      Chronic Lung Diseases: - Group verbal and written instruction to review new updates, new respiratory medications, new advancements in procedures and treatments. Provide informative websites and "800" numbers of self-education.   Pulmonary Rehab from 02/27/2017 in [facility] Cardiac and Pulmonary Rehab  Date  01/21/17  Educator  LB  Instruction Review Code  2- meets goals/outcomes      Lung Procedures: - Group verbal and written instruction to describe testing methods done to diagnose lung disease. Review the outcome of test results. Describe the treatment choices: Pulmonary Function Tests, ABGs and oximetry.   Energy Conservation: - Provide group verbal and written instruction for methods to conserve energy, plan and organize activities. Instruct on pacing techniques, use of adaptive equipment and posture/positioning to relieve shortness of breath.   Pulmonary Rehab from 02/27/2017 in [facility] Cardiac and Pulmonary Rehab  Date  12/10/16  Educator  [facility]  Instruction Review Code  2- meets goals/outcomes      Triggers: - Group verbal and written instruction to review types of environmental controls: home humidity, furnaces, filters, dust mite/pet prevention, HEPA vacuums. To discuss weather changes, air quality and the benefits of nasal washing.   Pulmonary Rehab from 02/27/2017 in [facility] Cardiac and Pulmonary Rehab  Date  02/04/17  Educator  LB  Instruction Review Code  2- meets goals/outcomes      Exacerbations: - Group verbal and written instruction to provide: warning signs, infection symptoms, calling MD promptly, preventive modes, and value of vaccinations. Review: effective airway clearance, coughing and/or vibration techniques. Create an Sports administrator.   Pulmonary Rehab from 02/27/2017 in [facility] Cardiac and Pulmonary Rehab  Date  01/28/17  Educator  LB  Instruction Review Code  2- meets goals/outcomes      Oxygen: - Individual and group verbal and written  instruction on oxygen therapy. Includes supplement oxygen, available portable oxygen systems, continuous and intermittent flow rates, oxygen safety, concentrators, and Medicare reimbursement for oxygen.   Respiratory Medications: - Group verbal and written instruction to review medications for lung disease. Drug class, frequency, complications, importance of spacers, rinsing mouth after steroid MDI's, and proper cleaning methods for nebulizers.  Pulmonary Rehab from 02/27/2017 in [facility] Cardiac and Pulmonary Rehab  Date  09/30/16  Educator  LB  Instruction Review Code  2- meets goals/outcomes      AED/CPR: - Group verbal and written instruction with the use of models to demonstrate the basic use of the AED with the basic ABC's of resuscitation.   Pulmonary Rehab from 02/27/2017 in [facility] Cardiac and Pulmonary Rehab  Date  02/06/17  Educator  MA  Instruction Review Code  2- meets goals/outcomes      Breathing Retraining: - Provides individuals verbal and written instruction on purpose, frequency, and proper technique of diaphragmatic breathing and pursed-lipped breathing. Applies individual practice skills.   Anatomy and Physiology of the Lungs: - Group verbal and written instruction with the use of models to provide basic lung anatomy and physiology related to function, structure and complications of lung disease.   Pulmonary Rehab from 02/27/2017 in [facility] Cardiac and Pulmonary Rehab  Date  01/02/17  Educator  LB  Instruction Review Code  2- meets goals/outcomes      Heart Failure: - Group verbal and written instruction on the basics of heart failure: signs/symptoms, treatments, explanation of ejection fraction, enlarged heart and cardiomyopathy.   Pulmonary Rehab from 02/27/2017 in [facility] Cardiac and Pulmonary Rehab  Date  02/27/17  Educator  CE  Instruction Review Code  2- meets goals/outcomes      Sleep Apnea: - Individual verbal and written instruction to review Obstructive  Sleep Apnea. Review of risk factors, methods for diagnosing and types of masks and machines for OSA.   Anxiety: - Provides group, verbal and written instruction on the correlation between heart/lung disease and anxiety, treatment options, and management of anxiety.   Relaxation: - Provides group, verbal and written instruction about the benefits of relaxation for patients with heart/lung disease. Also provides patients with examples of relaxation techniques.   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   Expected Outcomes Achievement of increased cardiorespiratory fitness and enhanced flexibility, muscular endurance and strength shown through measurements of functional capacity and personal statement of participant.   Increase Strength and Stamina Yes  Home TM and Exercise bike; uses 2-3lb weights 90 reps several days/week   Intervention Provide advice, education, support and counseling about physical activity/exercise needs.;Develop an individualized exercise prescription for aerobic and resistive training based on initial evaluation findings, risk stratification, comorbidities and participant's personal goals.    Expected Outcomes Achievement of increased cardiorespiratory fitness and enhanced flexibility, muscular endurance and strength shown through measurements of functional capacity and personal statement of participant.   Develop more efficient breathing techniques such as purse lipped breathing and diaphragmatic breathing; and practicing self-pacing with activity Yes   Intervention Provide education, demonstration and support  about specific breathing techniuqes utilized for more efficient breathing. Include techniques such as pursed lipped breathing, diaphragmatic breathing and self-pacing activity.   Expected Outcomes Short Term: Participant will be able to demonstrate and use breathing techniques as needed throughout daily activities.   Increase knowledge of respiratory medications and ability to use respiratory devices properly  Yes  ProAir with spacer; CPAP 12cmH2O, nasal mask   Intervention Provide education and demonstration as needed of appropriate use of medications, inhalers, and oxygen therapy.   Expected Outcomes Short Term: Achieves understanding of medications use. Understands that oxygen is a medication prescribed by physician. Demonstrates appropriate use of inhaler and oxygen therapy.   Lipids Yes   Intervention Provide education and support for participant on nutrition & aerobic/resistive exercise along with prescribed medications to achieve LDL '70mg'$ , HDL >'40mg'$ .   Expected Outcomes Short Term: Participant states understanding of desired cholesterol values and is compliant with medications prescribed. Participant is following exercise prescription and nutrition guidelines.;Long Term: Cholesterol controlled with medications as prescribed, with individualized exercise RX and with personalized nutrition plan. Value goals: LDL < '70mg'$ , HDL > 40 mg.      Core Components/Risk Factors/Patient Goals Review:      Goals and Risk Factor Review    Row Name 12/08/16 1229 12/17/16 1535 12/31/16 1402  02/24/17 0709       Core Components/Risk Factors/Patient Goals Review   Personal Goals Review Develop more efficient breathing techniques such as purse lipped breathing and diaphragmatic breathing and practicing self-pacing with activity. Weight Management/Obesity;Improve shortness of breath with ADL's;Develop more efficient breathing techniques such as purse lipped breathing and diaphragmatic breathing and practicing self-pacing with activity.;Increase knowledge of respiratory medications and ability to use respiratory devices properly.;Lipids Improve shortness of breath with ADL's;Increase knowledge of respiratory medications and ability to use respiratory devices properly.;Develop more efficient breathing techniques such as purse lipped breathing and diaphragmatic breathing and practicing self-pacing with activity.;Lipids Improve shortness of breath with ADL's;Increase knowledge of respiratory medications and ability to use respiratory devices properly.;Develop more efficient breathing techniques such as purse lipped breathing and diaphragmatic breathing and practicing self-pacing with activity.    Review PLB was demonstrated and discussed with patient. He demonstrated understanding of how to use PLB during exercise and daily activities to help control SOB.  Mr Throgmorton has completed 5 sessions of [facility] and has progressed his exercise goals. He uses his CPAP every night and has a good understanding of the importance of this therapy. I gave him a spacer for his ProAir and will instruct him on it's use in the next class. He does not use his ProAir frequently. He has good technique with his PLB and is qued to use the technique. Mr Wild is enjooying the socilaization. Mr Schueler is progressing with his exercise goals. He also walks outside when the weather is nice or uses his treadmill indoors on the days he has off from [place]. He has a good understanding of his CPAP and is very compliant every night. For his  ProAir, I gave him a spacer with directions and cleaning instructions. He uses hand weights at home - arm curls. Mr Soto attends regularly and enjoys the education and socialization. Mr Mcnatt has a few more sessions before [facility] graduation. He has progressed with his exercise goals and state his stamina and endurance has improved since starting [facility]. 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 Mr Rousseau has learned to manage his shortness of breath with PLB and pacing. He has a good understanding of his ProAir and uses the spacer. Being very compliant with his nasal CPAP , Mr Bur knows his pressure of 12cmH2O and how to take care of the equipment. Mr Wilner is commended for his regular attendence with  exercise and education.    Expected Outcomes Patient will use PLB during exercise and daily activties to help control SOB and maintain oxygen saturations.  Continue to progress with his exercise goals and use PLB with exercise and with home activities. Continue progressing with his exercise and properly use the spacer with his Proair. Continue his regular exercise with walks on the farm and with his home equipment. Continue self-managing his Asthma and CHF as learned from the [place] education.       Core Components/Risk Factors/Patient Goals at Discharge (Final Review):      Goals and Risk Factor Review - 02/24/17 0709      Core Components/Risk Factors/Patient Goals Review   Personal Goals Review Improve shortness of breath with ADL's;Increase knowledge of respiratory medications and ability to use respiratory devices properly.;Develop more efficient breathing techniques such as purse lipped breathing and diaphragmatic breathing and practicing self-pacing with activity.   Review Mr Gluth has a few more sessions before  [facility] graduation. He has progressed with his exercise goals and state his stamina and endurance has improved since starting [facility]. 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 Mr Pienta has learned to manage his shortness of breath with PLB and pacing. He has a good understanding of his ProAir and uses the spacer. Being very compliant with his nasal CPAP , Mr Shaneyfelt knows his pressure of 12cmH2O and how to take care of the equipment. Mr Sarin is commended for his regular attendence with  exercise and education.   Expected Outcomes Continue his regular exercise with walks on the farm and with his home equipment. Continue self-managing his Asthma and CHF as learned from the [place] education.      ITP Comments:     ITP Comments    Row Name 10/10/16 1106 10/20/16 0855 11/18/16 1333 12/08/16 1532 02/16/17 0737   ITP Comments Returned call.  Wiliam is scheduled for a stent removal on Tuesday.  He is cleared to return from cardiac and kidney standpoint.  Spoke with Jana Half about making sure that Dr. Yves Dill provides clearance for Carloyn Manner to start rehab after his stent removal. Called Mr Nichol to check on his condition from his hospitalization. I spoke with Mrs Akel who states Mr Witczak will have 2 weeks of antibiotics through his IV line and will have home nursing and PT therapy for several weeks. Mr Diaz does want to return to [place] after clearance from his physician. Called Mr Widen - last attended 09/30/17 due to hospitalization. I spoke with Ms Dutko, and she states Mr Engelmann is doing better - IV line removed, finished treatment from home RN's and PT's, and continuation of home exercises. He has an appointment 12/05/16 with Dr Rayann Heman and will inquire for a clearance to return to [place]. Mr Farish brought in clearance to return to [place].    30 day note review by Dr Emily Filbert, Medical Director of  [facility]      Comments:  [place] graduated today from cardiac rehab with 36 sessions completed.  Details of the patient's exercise prescription and what He needs to do in order to continue the prescription and progress were discussed with patient.  Patient was given a copy of prescription and goals.  Patient verbalized understanding.  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 He is also planning to buy a recumbant bike.

## 2017-05-09 NOTE — Progress Notes (Signed)
Electrophysiology Follow Up Date: 05/13/2017  ID:  Douglas Washington, DOB May 28, 1929, MRN [id number]  PCP: Kandyce Rud, MD Primary Cardiologist: Laurena Bering Electrophysiologist: Johney Frame  CC: follow-up   Douglas Washington is a 83 y.o. male seen today for Dr Johney Frame.  He returns today for routine EP follow up.  He graduated from pulmonary rehab and has continued exercising at home on the treadmill and stairclimber.  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  He remains on Eliquis for PE/DVT.  Dr Laruth Bouchard at last visit did not want to discontinue per the family's understanding. They are anxious to stop OAC. He denies chest pain, shortness of breath, LE edema, recent fevers, chills, nausea or vomiting.   Device History: MDT dual chamber PPM implanted 2011 for sinus bradycardia   Past Medical History:  Diagnosis Date  . Allergy   . Asthma   . CHF (congestive heart failure) (HCC)    10/17 WATCHMAN FILTER IMPLANTED  . Chronic kidney disease    Kidney stones  . COPD (chronic obstructive pulmonary disease) (HCC)   . Dysrhythmia    WATCHMAN FILTER 10/17  . Glaucoma (increased eye pressure)   . Hearing loss   . History of shingles 4 plus years ago   set in left eye  . Hypertension   . OSA on CPAP   . Persistent atrial fibrillation (HCC) 06/05/2016  . Presence of permanent cardiac pacemaker    2010  . Renal insufficiency   . Sebaceous cyst    Upper back  . Shortness of breath dyspnea    with exertion  . Sinus bradycardia    s/p MDT PPM   . Traumatic brain injury (HCC) 01/2005  . Ventricular tachycardia (HCC)    05/2016 requiring DCCV - started on amiodarone    Past Surgical History:  Procedure Laterality Date  . APPENDECTOMY    . CARDIAC CATHETERIZATION N/A 06/06/2016   Procedure: Left Heart Cath and Coronary Angiography;  Surgeon: Kathleene Hazel, MD;  Location: [facility] INVASIVE CV LAB;  Service: Cardiovascular;  Laterality: N/A;  . CRANIOTOMY   03/2005  . CYSTOSCOPY W/ URETERAL STENT REMOVAL Left 10/14/2016   Procedure: CYSTOSCOPY WITH STENT REMOVAL;  Surgeon: Orson Ape, MD;  Location: [facility] ORS;  Service: Urology;  Laterality: Left;  . CYSTOSCOPY WITH STENT PLACEMENT Left 07/15/2016   Procedure: CYSTOSCOPY WITH STENT PLACEMENT;  Surgeon: Orson Ape, MD;  Location: [facility] OR[facility];  Service: Urology;  Laterality: Left;  . EXTRACORPOREAL SHOCK WAVE LITHOTRIPSY Left 10/02/2016   Procedure: EXTRACORPOREAL SHOCK WAVE LITHOTRIPSY (ESWL);  Surgeon: Orson Ape, MD;  Location: [facility] ORS;  Service: Urology;  Laterality: Left;  . INSERT / REPLACE / REMOVE PACEMAKER    . JOINT REPLACEMENT    . LEFT ATRIAL APPENDAGE OCCLUSION  07/31/2016  . LEFT ATRIAL APPENDAGE OCCLUSION N/A 07/31/2016   Procedure: LEFT ATRIAL APPENDAGE OCCLUSION;  Surgeon: [name], MD;  Location: [facility] INVASIVE CV LAB;  Service: Cardiovascular;  Laterality: N/A;  . PACEMAKER INSERTION  2011   MDT dual chamber PPM followed by [facility] implanted for sinus bradycardia  . SHOULDER ACROMIOPLASTY Bilateral   . TEE WITHOUT CARDIOVERSION N/A 07/21/2016   Procedure: TRANSESOPHAGEAL ECHOCARDIOGRAM (TEE);  Surgeon: Chilton Si, MD;  Location: [facility] ENDOSCOPY;  Service: Cardiovascular;  Laterality: N/A;  . TEE WITHOUT CARDIOVERSION N/A 09/12/2016   Procedure: TRANSESOPHAGEAL ECHOCARDIOGRAM (TEE);  Surgeon: Wendall Stade, MD;  Location: [facility] ENDOSCOPY;  Service: Cardiovascular;  Laterality: N/A;  . TOTAL HIP ARTHROPLASTY Left 2005  . URETEROSCOPY WITH HOLMIUM LASER LITHOTRIPSY Left 07/15/2016   Procedure: URETEROSCOPY WITH HOLMIUM LASER LITHOTRIPSY;  Surgeon: Orson Ape, MD;  Location: [facility] ORS;  Service: Urology;  Laterality: Left;    Current Outpatient Prescriptions  Medication Sig Dispense Refill  . albuterol (PROVENTIL HFA;VENTOLIN HFA) 108 (90 BASE) MCG/ACT inhaler Inhale 2 puffs into the lungs every 6 (six) hours as needed for wheezing or shortness of breath.    Marland Kitchen  amiodarone (PACERONE) 200 MG tablet Take 0.5 tablets (100 mg total) by mouth daily. 15 tablet 6  . brimonidine (ALPHAGAN) 0.2 % ophthalmic solution Place 1 drop into the left eye 2 (two) times daily.  5  . carvedilol (COREG) 3.125 MG tablet Take 1 tablet (3.125 mg total) by mouth 2 (two) times daily with a meal. 180 tablet 3  . cetirizine (ZYRTEC) 10 MG tablet Take 10 mg by mouth daily as needed for allergies.     Marland Kitchen docusate sodium (COLACE) 100 MG capsule Take 200 mg by mouth 2 (two) times daily.    . dorzolamide (TRUSOPT) 2 % ophthalmic solution Place 1 drop into both eyes 2 (two) times daily.    Marland Kitchen erythromycin ophthalmic ointment Place 1 application into both eyes at bedtime.   3  . Glucosamine-Chondroit-Vit C-Mn (GLUCOSAMINE 1500 COMPLEX) CAPS Take 1 capsule by mouth daily.    Marland Kitchen latanoprost (XALATAN) 0.005 % ophthalmic solution Place 1 drop into both eyes at bedtime.    Marland Kitchen MEGARED OMEGA-3 KRILL OIL 500 MG CAPS Take 500 mg by mouth daily.     . montelukast (SINGULAIR) 10 MG tablet Take 10 mg by mouth at bedtime.    . Multiple Vitamins-Minerals (MULTIVITAMIN WITH MINERALS) tablet Take 1 tablet by mouth daily.    . timolol (TIMOPTIC) 0.5 % ophthalmic solution Place 1 drop into the left eye 2 (two) times daily.  4  . valACYclovir (VALTREX) 500 MG tablet Take 500 mg by mouth daily.    Marland Kitchen apixaban (ELIQUIS) 2.5 MG TABS tablet Take 1 tablet (2.5 mg total) by mouth 2 (two) times daily. 60 tablet 3   No current facility-administered medications for this visit.     Allergies:   Penicillins; Adhesive [tape]; Ciprofloxacin; Oxycodone; Simvastatin; Statins; and Sulfa antibiotics   Social History: Social History   Social History  . Marital status: Married    Spouse name: N/A  . Number of children: N/A  . Years of education: N/A   Occupational History  . Retired    Social History Main Topics  . Smoking status: Former Smoker    Quit date: 04/14/1974  . Smokeless tobacco: Former Neurosurgeon    Quit date:  04/14/1974  . Alcohol use No  . Drug use: No  . Sexual activity: Not on file   Other Topics Concern  . Not on file   Social History Narrative  . No narrative on file    Family History: Family History  Problem Relation Age of Onset  . Diabetes Mother   . Heart disease Father   . Heart disease Brother      Review of Systems: All other systems reviewed and are otherwise negative except as noted above.   Physical Exam: VS:  BP 112/60   Pulse (!) 55   Ht 5\' 10"  (1.778 m)   Wt 206 lb (93.4 kg)   BMI 29.56 kg/m  , BMI Body mass index is 29.56 kg/m.  GEN- The patient is elderly  appearing, alert and oriented x 3 today.   HEENT: normocephalic, atraumatic; sclera clear, conjunctiva pink; hearing intact; oropharynx clear; neck supple Lungs- Clear to ausculation bilaterally, normal work of breathing.  No wheezes, rales, rhonchi Heart- Regular rate and rhythm GI- soft, non-tender, non-distended, bowel sounds present Extremities- no clubbing, cyanosis, or edema; DP/PT/radial pulses 2+ bilaterally MS- no significant deformity or atrophy Skin- warm and dry, no rash or lesion; PPM pocket well healed Psych- euthymic mood, full affect Neuro- strength and sensation are intact, no focal neuro defects  PPM Interrogation- reviewed in detail today,  See PACEART report  EKG:  EKG is not ordered today.  Recent Labs: 06/05/2016: B Natriuretic Peptide 120.5; TSH 1.339 10/15/2016: ALT 13; BUN 26; Magnesium 1.8; Potassium 3.7; Sodium 137 10/18/2016: Hemoglobin 11.0; Platelets 205 10/19/2016: Creatinine, Ser 0.99   Wt Readings from Last 3 Encounters:  05/13/17 206 lb (93.4 kg)  12/05/16 207 lb (93.9 kg)  10/19/16 204 lb 12.8 oz (92.9 kg)     Assessment and Plan:  1.  Ventricular tachycardia Few short NSVT episodes since last device interrogation Continue amiodarone 100mg  daily  TSH, LFT's today   2. Symptomatic bradycardia Normal PPM function See Pace Art report No changes  today Battery longevity 11-30 months.  I have encouraged compliance with pacemaker follow up with Dr Gwen Pounds. We also discussed how remote monitoring could be beneficial today.   3.  Persistent atrial fibrillation Maintaining SR Doing well s/p Watchman 6 week TEE showed well seated device with no leaks. From a stroke prevention standpoint for AF, he could be off OAC at this time and on Plavix/ASA He did have PE during hospitalization >6 months ago and has been on Eliquis 5mg  twice daily since that time. Per guidelines, will decrease Eliquis to 2.5mg  twice daily today.  From an AF standpoint, no need for long term OAC since Watchman is in place. Will defer final decision to stop Eliquis to Dr Gwen Pounds  4.  HTN Stable No change required today  5.  CAD Stable No change required today Intolerant of statins    Current medicines are reviewed at length with the patient today.   The patient does not have concerns regarding his medicines.  The following changes were made today:  Decrease Eliquis to 2.5mg  twice daily  Labs/ tests ordered today include Orders Placed This Encounter  Procedures  . TSH  . Hepatic function panel  . CUP PACEART INCLINIC DEVICE CHECK     Disposition:   Follow up with me in 6 months per patient request   Signed, Gypsy Balsam, NP  05/13/2017 10:19 AM  [facility] [address] [place] [address] [phone] (office) [phone] (fax)

## 2017-05-13 ENCOUNTER — Encounter: Payer: Self-pay | Admitting: Nurse Practitioner

## 2017-05-13 ENCOUNTER — Ambulatory Visit (INDEPENDENT_AMBULATORY_CARE_PROVIDER_SITE_OTHER): Payer: Medicare Other | Admitting: Nurse Practitioner

## 2017-05-13 VITALS — BP 112/60 | HR 55 | Ht 70.0 in | Wt 206.0 lb

## 2017-05-13 DIAGNOSIS — I10 Essential (primary) hypertension: Secondary | ICD-10-CM | POA: Diagnosis not present

## 2017-05-13 DIAGNOSIS — I2510 Atherosclerotic heart disease of native coronary artery without angina pectoris: Secondary | ICD-10-CM

## 2017-05-13 DIAGNOSIS — I472 Ventricular tachycardia, unspecified: Secondary | ICD-10-CM

## 2017-05-13 DIAGNOSIS — R001 Bradycardia, unspecified: Secondary | ICD-10-CM

## 2017-05-13 DIAGNOSIS — I481 Persistent atrial fibrillation: Secondary | ICD-10-CM | POA: Diagnosis not present

## 2017-05-13 DIAGNOSIS — I4819 Other persistent atrial fibrillation: Secondary | ICD-10-CM

## 2017-05-13 LAB — CUP PACEART INCLINIC DEVICE CHECK
Date Time Interrogation Session: 20180801101730
Implantable Lead Location: 753859
Implantable Lead Model: 5076
Implantable Pulse Generator Implant Date: 20110318
MDC IDC LEAD IMPLANT DT: 20110318
MDC IDC LEAD IMPLANT DT: 20110318
MDC IDC LEAD LOCATION: 753860

## 2017-05-13 LAB — HEPATIC FUNCTION PANEL
ALK PHOS: 65 IU/L (ref 39–117)
ALT: 14 IU/L (ref 0–44)
AST: 17 IU/L (ref 0–40)
Albumin: 3.8 g/dL (ref 3.5–4.7)
BILIRUBIN TOTAL: 0.3 mg/dL (ref 0.0–1.2)
Bilirubin, Direct: 0.09 mg/dL (ref 0.00–0.40)
Total Protein: 6.3 g/dL (ref 6.0–8.5)

## 2017-05-13 LAB — TSH: TSH: 1.87 u[IU]/mL (ref 0.450–4.500)

## 2017-05-13 MED ORDER — APIXABAN 2.5 MG PO TABS: 2.5000 mg | ORAL_TABLET | Freq: Two times a day (BID) | ORAL | 3 refills | Status: AC

## 2017-05-13 NOTE — Patient Instructions (Signed)
Medication Instructions:  Your physician has recommended you make the following change in your medication: we are decreasing your Eliquis to 2.5 mg twice a day.  Labwork: Your physician recommends that you return for lab work today for TSH, LFT  Testing/Procedures: None Ordered   Follow-Up: Your physician wants you to follow-up in: 6 months with Douglas Balsam, NP.  You will receive a reminder letter in the mail two months in advance. If you don't receive a letter, please call our office to schedule the follow-up appointment.   Any Other Special Instructions Will Be Listed Below (If Applicable).     If you need a refill on your cardiac medications before your next appointment, please call your pharmacy.

## 2017-07-25 ENCOUNTER — Emergency Department: Payer: Medicare Other

## 2017-07-25 ENCOUNTER — Emergency Department
Admission: EM | Admit: 2017-07-25 | Discharge: 2017-07-25 | Disposition: A | Discharge status: Other Acute Inpatient Hospital | Payer: Medicare Other | Attending: Emergency Medicine | Admitting: Emergency Medicine

## 2017-07-25 DIAGNOSIS — S2242XA Multiple fractures of ribs, left side, initial encounter for closed fracture: Secondary | ICD-10-CM | POA: Insufficient documentation

## 2017-07-25 DIAGNOSIS — Z96642 Presence of left artificial hip joint: Secondary | ICD-10-CM | POA: Insufficient documentation

## 2017-07-25 DIAGNOSIS — Z7901 Long term (current) use of anticoagulants: Secondary | ICD-10-CM | POA: Insufficient documentation

## 2017-07-25 DIAGNOSIS — S065X0A Traumatic subdural hemorrhage without loss of consciousness, initial encounter: Secondary | ICD-10-CM | POA: Insufficient documentation

## 2017-07-25 DIAGNOSIS — N189 Chronic kidney disease, unspecified: Secondary | ICD-10-CM | POA: Insufficient documentation

## 2017-07-25 DIAGNOSIS — Y999 Unspecified external cause status: Secondary | ICD-10-CM | POA: Diagnosis not present

## 2017-07-25 DIAGNOSIS — J449 Chronic obstructive pulmonary disease, unspecified: Secondary | ICD-10-CM | POA: Diagnosis not present

## 2017-07-25 DIAGNOSIS — K920 Hematemesis: Secondary | ICD-10-CM | POA: Diagnosis not present

## 2017-07-25 DIAGNOSIS — Y939 Activity, unspecified: Secondary | ICD-10-CM | POA: Insufficient documentation

## 2017-07-25 DIAGNOSIS — Z87891 Personal history of nicotine dependence: Secondary | ICD-10-CM | POA: Diagnosis not present

## 2017-07-25 DIAGNOSIS — S064X0A Epidural hemorrhage without loss of consciousness, initial encounter: Secondary | ICD-10-CM | POA: Diagnosis not present

## 2017-07-25 DIAGNOSIS — W109XXA Fall (on) (from) unspecified stairs and steps, initial encounter: Secondary | ICD-10-CM | POA: Diagnosis not present

## 2017-07-25 DIAGNOSIS — Z950 Presence of cardiac pacemaker: Secondary | ICD-10-CM | POA: Insufficient documentation

## 2017-07-25 DIAGNOSIS — S0990XA Unspecified injury of head, initial encounter: Secondary | ICD-10-CM | POA: Diagnosis present

## 2017-07-25 DIAGNOSIS — I130 Hypertensive heart and chronic kidney disease with heart failure and stage 1 through stage 4 chronic kidney disease, or unspecified chronic kidney disease: Secondary | ICD-10-CM | POA: Insufficient documentation

## 2017-07-25 DIAGNOSIS — I5022 Chronic systolic (congestive) heart failure: Secondary | ICD-10-CM | POA: Diagnosis not present

## 2017-07-25 DIAGNOSIS — S064XAA Epidural hemorrhage with loss of consciousness status unknown, initial encounter: Secondary | ICD-10-CM

## 2017-07-25 DIAGNOSIS — Z79899 Other long term (current) drug therapy: Secondary | ICD-10-CM | POA: Diagnosis not present

## 2017-07-25 DIAGNOSIS — S066X0A Traumatic subarachnoid hemorrhage without loss of consciousness, initial encounter: Secondary | ICD-10-CM | POA: Insufficient documentation

## 2017-07-25 DIAGNOSIS — Y929 Unspecified place or not applicable: Secondary | ICD-10-CM | POA: Diagnosis not present

## 2017-07-25 DIAGNOSIS — J9600 Acute respiratory failure, unspecified whether with hypoxia or hypercapnia: Secondary | ICD-10-CM | POA: Insufficient documentation

## 2017-07-25 DIAGNOSIS — J9690 Respiratory failure, unspecified, unspecified whether with hypoxia or hypercapnia: Secondary | ICD-10-CM

## 2017-07-25 DIAGNOSIS — I609 Nontraumatic subarachnoid hemorrhage, unspecified: Secondary | ICD-10-CM

## 2017-07-25 DIAGNOSIS — S065XAA Traumatic subdural hemorrhage with loss of consciousness status unknown, initial encounter: Secondary | ICD-10-CM

## 2017-07-25 DIAGNOSIS — S065X9A Traumatic subdural hemorrhage with loss of consciousness of unspecified duration, initial encounter: Secondary | ICD-10-CM

## 2017-07-25 DIAGNOSIS — S2249XA Multiple fractures of ribs, unspecified side, initial encounter for closed fracture: Secondary | ICD-10-CM

## 2017-07-25 DIAGNOSIS — S064X9A Epidural hemorrhage with loss of consciousness of unspecified duration, initial encounter: Secondary | ICD-10-CM

## 2017-07-25 LAB — CBC WITH DIFFERENTIAL/PLATELET
Basophils Absolute: 0 10*3/uL (ref 0–0.1)
Basophils Relative: 0 %
Eosinophils Absolute: 0.1 10*3/uL (ref 0–0.7)
Eosinophils Relative: 1 %
HCT: 40.1 % (ref 40.0–52.0)
Hemoglobin: 13.1 g/dL (ref 13.0–18.0)
Lymphocytes Relative: 41 %
Lymphs Abs: 3.6 10*3/uL (ref 1.0–3.6)
MCH: 33.3 pg (ref 26.0–34.0)
MCHC: 32.6 g/dL (ref 32.0–36.0)
MCV: 102 fL — ABNORMAL HIGH (ref 80.0–100.0)
Monocytes Absolute: 0.7 10*3/uL (ref 0.2–1.0)
Monocytes Relative: 8 %
Neutro Abs: 4.4 10*3/uL (ref 1.4–6.5)
Neutrophils Relative %: 50 %
Platelets: 181 10*3/uL (ref 150–440)
RBC: 3.93 MIL/uL — ABNORMAL LOW (ref 4.40–5.90)
RDW: 17.4 % — ABNORMAL HIGH (ref 11.5–14.5)
WBC: 8.9 10*3/uL (ref 3.8–10.6)

## 2017-07-25 LAB — BLOOD GAS, ARTERIAL
ACID-BASE DEFICIT: 11.3 mmol/L — AB (ref 0.0–2.0)
ACID-BASE DEFICIT: 8.2 mmol/L — AB (ref 0.0–2.0)
Bicarbonate: 19.6 mmol/L — ABNORMAL LOW (ref 20.0–28.0)
Bicarbonate: 20.5 mmol/L (ref 20.0–28.0)
FIO2: 1
FIO2: 100
LHR: 20 {breaths}/min
MECHVT: 500 mL
O2 SAT: 99 %
O2 SAT: 99.9 %
PCO2 ART: 55 mmHg — AB (ref 32.0–48.0)
PCO2 ART: 66 mmHg — AB (ref 32.0–48.0)
PEEP: 5 cmH2O
PEEP: 5 cmH2O
PH ART: 7.08 — AB (ref 7.350–7.450)
PO2 ART: 366 mmHg — AB (ref 83.0–108.0)
Patient temperature: 37
Patient temperature: 37
RATE: 20 resp/min
VT: 500 mL
pH, Arterial: 7.18 — CL (ref 7.350–7.450)
pO2, Arterial: 173 mmHg — ABNORMAL HIGH (ref 83.0–108.0)

## 2017-07-25 LAB — COMPREHENSIVE METABOLIC PANEL
ALT: 20 U/L (ref 17–63)
AST: 38 U/L (ref 15–41)
Albumin: 3.3 g/dL — ABNORMAL LOW (ref 3.5–5.0)
Alkaline Phosphatase: 64 U/L (ref 38–126)
Anion gap: 10 (ref 5–15)
BUN: 25 mg/dL — ABNORMAL HIGH (ref 6–20)
CO2: 20 mmol/L — ABNORMAL LOW (ref 22–32)
Calcium: 9.1 mg/dL (ref 8.9–10.3)
Chloride: 107 mmol/L (ref 101–111)
Creatinine, Ser: 1.42 mg/dL — ABNORMAL HIGH (ref 0.61–1.24)
GFR calc Af Amer: 50 mL/min — ABNORMAL LOW (ref >60)
GFR calc non Af Amer: 43 mL/min — ABNORMAL LOW (ref >60)
Glucose, Bld: 266 mg/dL — ABNORMAL HIGH (ref 65–99)
Potassium: 4.3 mmol/L (ref 3.5–5.1)
Sodium: 137 mmol/L (ref 135–145)
Total Bilirubin: 0.7 mg/dL (ref 0.3–1.2)
Total Protein: 6.8 g/dL (ref 6.5–8.1)

## 2017-07-25 LAB — ETHANOL

## 2017-07-25 LAB — PROTIME-INR
INR: 1.29
PROTHROMBIN TIME: 16 s — AB (ref 11.4–15.2)

## 2017-07-25 LAB — TROPONIN I: Troponin I: 0.03 ng/mL (ref <0.03)

## 2017-07-25 LAB — LACTIC ACID, PLASMA: Lactic Acid, Venous: 4.8 mmol/L (ref 0.5–1.9)

## 2017-07-25 MED ORDER — ETOMIDATE 2 MG/ML IV SOLN
INTRAVENOUS | Status: AC | PRN
  Administered 2017-07-25: 20 mg via INTRAVENOUS

## 2017-07-25 MED ORDER — MANNITOL 20% IV SOLUTION 10G/50ML
93.0000 g | Freq: Once | INTRAVENOUS | Status: AC
  Administered 2017-07-25: 93 g via INTRAVENOUS
  Filled 2017-07-25: qty 480

## 2017-07-25 MED ORDER — IOPAMIDOL (ISOVUE-300) INJECTION 61%
100.0000 mL | Freq: Once | INTRAVENOUS | Status: AC | PRN
  Administered 2017-07-25: 100 mL via INTRAVENOUS

## 2017-07-25 MED ORDER — DEXTROSE 5 % IV SOLN: 0.0000 ug/min | Freq: Once | INTRAVENOUS | Status: DC

## 2017-07-25 MED ORDER — SODIUM CHLORIDE 3 % IV SOLN
INTRAVENOUS | Status: DC
  Filled 2017-07-25 (×12): qty 500

## 2017-07-25 MED ORDER — SUCCINYLCHOLINE CHLORIDE 20 MG/ML IJ SOLN
INTRAMUSCULAR | Status: AC | PRN
  Administered 2017-07-25: 150 mg via INTRAVENOUS

## 2017-07-25 MED ORDER — SODIUM CHLORIDE 3 % IV SOLN
INTRAVENOUS | Status: DC
  Filled 2017-07-25 (×3): qty 500

## 2017-07-25 MED ORDER — PROTHROMBIN COMPLEX CONC HUMAN 500 UNITS IV KIT
4779.0000 [IU] | PACK | Status: AC
  Administered 2017-07-25: 4779 [IU] via INTRAVENOUS
  Filled 2017-07-25: qty 191

## 2017-07-25 MED FILL — Medication: Qty: 1 | Status: AC

## 2017-07-25 NOTE — Code Documentation (Signed)
Pt arrived via [facility] after falling at home, obvious bruising to left eye. EMS reports eliquis use at home, hx of clotting issues. EMS reports pt has been confused since their arrival at home, they placed nasal trump. Pt started vomiting, sats dropped and EMS placed a king airway. EMS reports sats only up to 75%, large amounts of vomitus and blood, CBG 163 and paced rhythm. Abrasion to left knee.

## 2017-07-25 NOTE — Code Documentation (Signed)
King airway removed. ED MD at bedside.

## 2017-07-25 NOTE — ED Provider Notes (Addendum)
[facility] Emergency Department Provider Note ____________________________________________   I have reviewed the triage vital signs and the triage nursing note.  HISTORY  Chief Complaint No chief complaint on file.   Historian Level 5 caveat - unresponsive Hx per ems and later wife  HPI Douglas Washington is a 83 y.o. male on eliquis presents after fall down 3? Stairs.  Was with altered mental status and enroute to trauma center ([place]) When patient started to decrease his saturations and was intubated with a King airway with persistent low sats and diverted to our ED for stabilization and evaluation.      Past Medical History:  Diagnosis Date  . Allergy   . Asthma   . CHF (congestive heart failure) (HCC)    10/17 WATCHMAN FILTER IMPLANTED  . Chronic kidney disease    Kidney stones  . COPD (chronic obstructive pulmonary disease) (HCC)   . Dysrhythmia    WATCHMAN FILTER 10/17  . Glaucoma (increased eye pressure)   . Hearing loss   . History of shingles 4 plus years ago   set in left eye  . Hypertension   . OSA on CPAP   . Persistent atrial fibrillation (HCC) 06/05/2016  . Presence of permanent cardiac pacemaker    2010  . Renal insufficiency   . Sebaceous cyst    Upper back  . Shortness of breath dyspnea    with exertion  . Sinus bradycardia    s/p MDT PPM   . Traumatic brain injury (HCC) 01/2005  . Ventricular tachycardia (HCC)    05/2016 requiring DCCV - started on amiodarone     Patient Active Problem List   Diagnosis Date Noted  . Septic shock (HCC) 10/15/2016  . VT (ventricular tachycardia) (HCC) 06/05/2016  . Sebaceous cyst 05/16/2015  . Benign essential HTN 01/31/2015  . Chronic systolic heart failure (HCC) 08/28/2014  . MI (mitral incompetence) 08/28/2014  . Obstructive apnea 08/28/2014  . Chest pain 04/28/2014  . Persistent atrial fibrillation (HCC) 03/14/2014  . Bradycardia 03/14/2014  . Combined fat and carbohydrate  induced hyperlipemia 03/14/2014    Past Surgical History:  Procedure Laterality Date  . APPENDECTOMY    . CARDIAC CATHETERIZATION N/A 06/06/2016   Procedure: Left Heart Cath and Coronary Angiography;  Surgeon: Kathleene Hazel, MD;  Location: [facility] INVASIVE CV LAB;  Service: Cardiovascular;  Laterality: N/A;  . CRANIOTOMY  03/2005  . CYSTOSCOPY W/ URETERAL STENT REMOVAL Left 10/14/2016   Procedure: CYSTOSCOPY WITH STENT REMOVAL;  Surgeon: Orson Ape, MD;  Location: [facility] ORS;  Service: Urology;  Laterality: Left;  . CYSTOSCOPY WITH STENT PLACEMENT Left 07/15/2016   Procedure: CYSTOSCOPY WITH STENT PLACEMENT;  Surgeon: Orson Ape, MD;  Location: [facility] ORS;  Service: Urology;  Laterality: Left;  . EXTRACORPOREAL SHOCK WAVE LITHOTRIPSY Left 10/02/2016   Procedure: EXTRACORPOREAL SHOCK WAVE LITHOTRIPSY (ESWL);  Surgeon: Orson Ape, MD;  Location: [facility] ORS;  Service: Urology;  Laterality: Left;  . INSERT / REPLACE / REMOVE PACEMAKER    . JOINT REPLACEMENT    . LEFT ATRIAL APPENDAGE OCCLUSION  07/31/2016  . LEFT ATRIAL APPENDAGE OCCLUSION N/A 07/31/2016   Procedure: LEFT ATRIAL APPENDAGE OCCLUSION;  Surgeon: [name], MD;  Location: [facility] INVASIVE CV LAB;  Service: Cardiovascular;  Laterality: N/A;  . PACEMAKER INSERTION  2011   MDT dual chamber PPM followed by [facility] implanted for sinus bradycardia  . SHOULDER ACROMIOPLASTY Bilateral   . TEE WITHOUT CARDIOVERSION N/A 07/21/2016   Procedure:  TRANSESOPHAGEAL ECHOCARDIOGRAM (TEE);  Surgeon: Chilton Si, MD;  Location: [facility] ENDOSCOPY;  Service: Cardiovascular;  Laterality: N/A;  . TEE WITHOUT CARDIOVERSION N/A 09/12/2016   Procedure: TRANSESOPHAGEAL ECHOCARDIOGRAM (TEE);  Surgeon: Wendall Stade, MD;  Location: [facility] ENDOSCOPY;  Service: Cardiovascular;  Laterality: N/A;  . TOTAL HIP ARTHROPLASTY Left 2005  . URETEROSCOPY WITH HOLMIUM LASER LITHOTRIPSY Left 07/15/2016   Procedure: URETEROSCOPY WITH HOLMIUM LASER LITHOTRIPSY;   Surgeon: Orson Ape, MD;  Location: [facility] ORS;  Service: Urology;  Laterality: Left;    Prior to Admission medications   Medication Sig Start Date End Date Taking? Authorizing Provider  albuterol (PROVENTIL HFA;VENTOLIN HFA) 108 (90 BASE) MCG/ACT inhaler Inhale 2 puffs into the lungs every 6 (six) hours as needed for wheezing or shortness of breath.   Yes [provider]  amiodarone (PACERONE) 200 MG tablet Take 0.5 tablets (100 mg total) by mouth daily. 10/15/16  Yes Seiler, Amber K, NP  apixaban (ELIQUIS) 2.5 MG TABS tablet Take 1 tablet (2.5 mg total) by mouth 2 (two) times daily. 05/13/17  Yes Seiler, Amber K, NP  brimonidine (ALPHAGAN) 0.2 % ophthalmic solution Place 1 drop into the left eye 2 (two) times daily. 04/27/15  Yes [provider]  carvedilol (COREG) 3.125 MG tablet Take 1 tablet (3.125 mg total) by mouth 2 (two) times daily with a meal. 07/10/16  Yes Seiler, [facility] K, NP  cetirizine (ZYRTEC) 10 MG tablet Take 10 mg by mouth daily as needed for allergies.    Yes [provider]  docusate sodium (COLACE) 100 MG capsule Take 200 mg by mouth 2 (two) times daily.   Yes [provider]  dorzolamide (TRUSOPT) 2 % ophthalmic solution Place 1 drop into both eyes 2 (two) times daily. 04/30/15  Yes [provider]  Glucosamine-Chondroit-Vit C-Mn (GLUCOSAMINE 1500 COMPLEX) CAPS Take 1 capsule by mouth daily.   Yes [provider]  latanoprost (XALATAN) 0.005 % ophthalmic solution Place 1 drop into both eyes at bedtime. 02/24/15  Yes [provider]  MEGARED OMEGA-3 KRILL OIL 500 MG CAPS Take 500 mg by mouth daily.    Yes [provider]  montelukast (SINGULAIR) 10 MG tablet Take 10 mg by mouth at bedtime.   Yes [provider]  Multiple Vitamins-Minerals (MULTIVITAMIN WITH MINERALS) tablet Take 1 tablet by mouth daily.   Yes [provider]  timolol (TIMOPTIC) 0.5 % ophthalmic solution Place 1 drop into the  left eye 2 (two) times daily. 04/03/15  Yes [provider]  valACYclovir (VALTREX) 500 MG tablet Take 500 mg by mouth daily. 05/08/16  Yes [provider]    Allergies  Allergen Reactions  . Penicillins Anaphylaxis and Swelling    Has patient had a PCN reaction causing immediate rash, facial/tongue/throat swelling, SOB or lightheadedness with hypotension: Yes Has patient had a PCN reaction causing severe rash involving mucus membranes or skin necrosis: No Has patient had a PCN reaction that required hospitalization No Has patient had a PCN reaction occurring within the last 10 years: No If all of the above answers are "NO", then may proceed with Cephalosporin use.   . Adhesive [Tape] Other (See Comments)    Tears skin  . Ciprofloxacin Other (See Comments)    Unknown  . Oxycodone Other (See Comments)    Hallucinations  . Simvastatin Other (See Comments)    Weakness  . Statins Other (See Comments)    Weakness, couldn't walk  . Sulfa Antibiotics Swelling    Family  History  Problem Relation Age of Onset  . Diabetes Mother   . Heart disease Father   . Heart disease Brother     Social History Social History  Substance Use Topics  . Smoking status: Former Smoker    Quit date: 04/14/1974  . Smokeless tobacco: Former Neurosurgeon    Quit date: 04/14/1974  . Alcohol use No    Review of Systems  level V caveat unable to obtain due to unresponsive ____________________________________________   PHYSICAL EXAM:  VITAL SIGNS: ED Triage Vitals  Enc Vitals Group     BP 2017/08/10 1315 120/68     Pulse Rate 08/10/17 1315 74     Resp 08/10/2017 1315 (!) 22     Temp --      Temp src --      SpO2 2017/08/10 1311 (!) 44 %     Weight 08-10-2017 1419 210 lb (95.3 kg)     Height --      Head Circumference --      Peak Flow --      Pain Score --      Pain Loc --      Pain Edu? --      Excl. in GC? --      Constitutional: occasionally moving 4 extremities minimally. No eye  opening. No verbal. HEENT   Head: very swollen face, no boggy scalp. Ecchymosis around both eyes. Swelling around the nose with blood coming from the nose.      Eyes: Conjunctivae are normal. Pupils equal and round, pinpoint.      Ears:         Nose: No congestion/rhinnorhea.   Mouth/Throat: King airway in the mouth. Emesis chunks around the mouth as well as blood coming from the mouth. No clear dental trauma noted.   Neck: No stridor. no step off. Cardiovascular/Chest: Normal rate, regular rhythm.  Respiratory: bag valve respirations with King airway in place, some attempts at breathing on his own, and adequate. Gastrointestinal: Soft. No distention. Genitourinary/rectal: normal visual exam. Musculoskeletal: pelvis stable. No obvious extremity deformities. Neurologic:  nonverbal. No following commands. No eye opening. Minimal movement 4 extremities, unclear as to a particular pattern in terms of decorticate or decerebrate posturing. Skin:  Skin is warm, dry   ____________________________________________  LABS (pertinent positives/negatives) I, Governor Rooks, MD the attending physician have reviewed the labs noted below.  Labs Reviewed  COMPREHENSIVE METABOLIC PANEL - Abnormal; Notable for the following:       Result Value   CO2 20 (*)    Glucose, Bld 266 (*)    BUN 25 (*)    Creatinine, Ser 1.42 (*)    Albumin 3.3 (*)    GFR calc non Af Amer 43 (*)    GFR calc Af Amer 50 (*)    All other components within normal limits  TROPONIN I - Abnormal; Notable for the following:    Troponin I 0.03 (*)    All other components within normal limits  LACTIC ACID, PLASMA - Abnormal; Notable for the following:    Lactic Acid, Venous 4.8 (*)    All other components within normal limits  CBC WITH DIFFERENTIAL/PLATELET - Abnormal; Notable for the following:    RBC 3.93 (*)    MCV 102.0 (*)    RDW 17.4 (*)    All other components within normal limits  PROTIME-INR - Abnormal; Notable  for the following:    Prothrombin Time 16.0 (*)    All other components  within normal limits  BLOOD GAS, ARTERIAL - Abnormal; Notable for the following:    pH, Arterial 7.08 (*)    pCO2 arterial 66 (*)    pO2, Arterial 173 (*)    Bicarbonate 19.6 (*)    Acid-base deficit 11.3 (*)    All other components within normal limits  BLOOD GAS, ARTERIAL - Abnormal; Notable for the following:    pH, Arterial 7.18 (*)    pCO2 arterial 55 (*)    pO2, Arterial 366 (*)    Acid-base deficit 8.2 (*)    All other components within normal limits  ETHANOL  LACTIC ACID, PLASMA    ____________________________________________    EKG I, Governor Rooks, MD, the attending physician have personally viewed and interpreted all ECGs.  61 bpm. Atrial ventricularly paced. ____________________________________________  RADIOLOGY All Xrays were viewed by me.  Imaging interpreted by Radiologist, and I, Governor Rooks, MD the attending physician have reviewed the radiologist interpretation noted below.  CT head, cervical spine, maxillofacial:  IMPRESSION: 1. Severe acute bilateral intracranial hemorrhage and intracranial mass effect. Large left extra-axial - probably Epidural - hematoma. Smaller right side, parafalcine, and ventral posterior fossa subdural hematomas. Bilateral subarachnoid hemorrhage and trace intraventricular hemorrhage. 2. Leftward midline shift up to 14 mm. Severe mass effect on the ventricles. 3. Associated both comminuted and linear fractures of the left floor of the middle cranial fossa and the left parietal bone. Additional skullbase fractures, some of which are occult at this time, with small volume pneumocephalus, and blood layering in the paranasal sinuses. 4. The above Critical Value/emergent results were called by telephone at the time of interpretation on 2017/08/14 at 1412 hours to Dr. Governor Rooks , who verbally acknowledged these results. 5. Left lateral orbital wall  fracture with moderate-sized left intraorbital hematoma up to 9 mm in thickness. 6.  No acute fracture or identified in the cervical spine.  CT abdomen and pelvis and chest:  IMPRESSION: 1. Left first and third throughsixth rib fractures with up to moderate displacement. Subpleural and soft tissue gas seen about the fractures without definite pneumothorax. 2. No evidence of intra-abdominal injury. 3. Advanced airway thickening. Bilateral reticulonodular opacities could be aspiration or chronic lung disease. 4. Multiple chronic and incidental findings noted above.  chest x-ray portable: IMPRESSION: Endotracheal tube in grossly good position. No acute cardiopulmonary abnormality seen.  Pelvis one view:   IMPRESSION: Foreshortening of the right femoral neck, without clearly identified fracture line. This may represent an incompletely visualized femoral neck fracture or may be due to the positioning. Repeated full sequence right hip radiographs are recommended, when clinically feasible. __________________________________________  PROCEDURES  Procedure(s) performed: INTUBATION Performed by: Governor Rooks  Required items: required blood products, implants, devices, and special equipment available Patient identity confirmed: provided demographic data and hospital-assigned identification number Time out: Immediately prior to procedure a "time out" was called to verify the correct patient, procedure, equipment, support staff and site/side marked as required.  Indications: respiratory failure in trauma, inadequate king airway   Intubation method: Glidescope Laryngoscopy   Preoxygenation: BVM  Sedatives: Etomidate Paralytic: Succinylcholine  Tube Size: 7.5 cuffed  Post-procedure assessment: chest rise and ETCO2 monitor Breath sounds: equal and absent over the epigastrium Tube secured with: ETT holder Chest x-ray interpreted by radiologist and me.  Chest x-ray findings:  endotracheal tube in appropriate position  Patient tolerated the procedure well with no immediate complications.       Critical Care performed: CRITICAL CARE Performed by: Governor Rooks  Total critical care time: 120 minutes  Critical care time was exclusive of separately billable procedures and treating other patients.  Critical care was necessary to treat or prevent imminent or life-threatening deterioration.  Critical care was time spent personally by me on the following activities: development of treatment plan with patient and/or surrogate as well as nursing, discussions with consultants, evaluation of patient's response to treatment, examination of patient, obtaining history from patient or surrogate, ordering and performing treatments and interventions, ordering and review of laboratory studies, ordering and review of radiographic studies, pulse oximetry and re-evaluation of patient's condition.   ____________________________________________  No current facility-administered medications on file prior to encounter.    Current Outpatient Prescriptions on File Prior to Encounter  Medication Sig Dispense Refill  . albuterol (PROVENTIL HFA;VENTOLIN HFA) 108 (90 BASE) MCG/ACT inhaler Inhale 2 puffs into the lungs every 6 (six) hours as needed for wheezing or shortness of breath.    Marland Kitchen amiodarone (PACERONE) 200 MG tablet Take 0.5 tablets (100 mg total) by mouth daily. 15 tablet 6  . apixaban (ELIQUIS) 2.5 MG TABS tablet Take 1 tablet (2.5 mg total) by mouth 2 (two) times daily. 60 tablet 3  . brimonidine (ALPHAGAN) 0.2 % ophthalmic solution Place 1 drop into the left eye 2 (two) times daily.  5  . carvedilol (COREG) 3.125 MG tablet Take 1 tablet (3.125 mg total) by mouth 2 (two) times daily with a meal. 180 tablet 3  . cetirizine (ZYRTEC) 10 MG tablet Take 10 mg by mouth daily as needed for allergies.     Marland Kitchen docusate sodium (COLACE) 100 MG capsule Take 200 mg by mouth 2 (two) times  daily.    . dorzolamide (TRUSOPT) 2 % ophthalmic solution Place 1 drop into both eyes 2 (two) times daily.    . Glucosamine-Chondroit-Vit C-Mn (GLUCOSAMINE 1500 COMPLEX) CAPS Take 1 capsule by mouth daily.    Marland Kitchen latanoprost (XALATAN) 0.005 % ophthalmic solution Place 1 drop into both eyes at bedtime.    Marland Kitchen MEGARED OMEGA-3 KRILL OIL 500 MG CAPS Take 500 mg by mouth daily.     . montelukast (SINGULAIR) 10 MG tablet Take 10 mg by mouth at bedtime.    . Multiple Vitamins-Minerals (MULTIVITAMIN WITH MINERALS) tablet Take 1 tablet by mouth daily.    . timolol (TIMOPTIC) 0.5 % ophthalmic solution Place 1 drop into the left eye 2 (two) times daily.  4  . valACYclovir (VALTREX) 500 MG tablet Take 500 mg by mouth daily.      ____________________________________________  ED COURSE / ASSESSMENT AND PLAN  Pertinent labs & imaging results that were available during my care of the patient were reviewed by me and considered in my medical decision making (see chart for details).   Patient arrived with reported trauma down several stairs with respiratory decompensation en Route with [facility] airway placed, O2 sat still in the 60s or 70s, presented to the emergency department.  c-collar was placed in the emergency department.  Patient remained with sats in the 60s. Decision was made to suction the airway remove the [facility] airway and placed ET tube which was done with quite scope. With attachment 200% FiO2, O2 sats did come up into the 90s.  No hypotension. Initial bedside portable chest x-ray and portable pelvis did not show major acute findings.  Plans were made for patient to immediately go for CT scanning head, face, neck, chest, abdomen, and pelvis.  I was called by the radiologist and was able to view the  head CT that showed terrible intracranial traumatic injury with multiple types of blood including possible epidural, much subdural, as well as subarachnoid with significant mass effect including some early  uncal herniation.  I spoke initially with [facility] neurosurgery about transferring this patient as expeditiously as possible to be at a place for possible intervention, and patient was declined for acceptance out of concern for patient with already nonsurvivable injury. Given that I do not have a neurosurgeon here for patient to consult with, I did consult and arrange transfer which was accepted at [facility], and patient will be flown with the quickest possible transport although there was some delay with the next available truck or air care.  I discussed with the ED physician regarding patient is now with head of bed at 45, increased ventilation rate in order to attempt to obtain of PCO2 30, we ordered mannitol at 1 g/kg, as well as case and try to help combat the coagulopathy.  I updated the wife as well as daughter and family friends. They understand the patient has an extremely critical illness and is extremely tenuous in terms of survival at this point in time. Patient has apparently had brain surgery before, so they would like to proceed with any and all treatments possible.  When I spoke with the wife about what she noticed, she had turned her head to look and see that the patient had gone out the back door with his boots on and then falling down the stairs, but she does not know if it was mechanical or if there was an inciting event prior to the actual trauma.   Discussed with air care team, bp 70s, map 65.  Will use levo if needed en route.   Will try hypertonic saline as well.  CONSULTATIONS:  Consulted with neurosurgery at [facility] - states unsurvivable injury, recommends keep patient at [facility] comfort care.  Consulted and accepted to transfer to [facility] trauma/ER and spoke with Dr. Myrtis Ser, ER attending.   Patient / Family / Caregiver informed of clinical course, medical decision-making process, and agree with plan.  ___________________________________________   FINAL CLINICAL  IMPRESSION(S) / ED DIAGNOSES   Final diagnoses:  Epidural hematoma (HCC)  Subdural hematoma (HCC)  Subarachnoid hemorrhage (HCC)  Multiple rib fractures involving four or more ribs  Respiratory failure after trauma [facility])              Note: This dictation was prepared with Dragon dictation. Any transcriptional errors that result from this process are unintentional    Governor Rooks, MD Aug 12, 2017 1527    Governor Rooks, MD 2017-08-12 [phone]

## 2017-07-25 NOTE — Progress Notes (Signed)
MEDICATION RELATED CONSULT NOTE - INITIAL   Pharmacy Consult for Kcentra monitoring  Indication: Life threatening bleed following apixaban anticoagulation  Allergies  Allergen Reactions  . Penicillins Anaphylaxis and Swelling    Has patient had a PCN reaction causing immediate rash, facial/tongue/throat swelling, SOB or lightheadedness with hypotension: Yes Has patient had a PCN reaction causing severe rash involving mucus membranes or skin necrosis: No Has patient had a PCN reaction that required hospitalization No Has patient had a PCN reaction occurring within the last 10 years: No If all of the above answers are "NO", then may proceed with Cephalosporin use.   . Adhesive [Tape] Other (See Comments)    Tears skin  . Ciprofloxacin Other (See Comments)    Unknown  . Oxycodone Other (See Comments)    Hallucinations  . Simvastatin Other (See Comments)    Weakness  . Statins Other (See Comments)    Weakness, couldn't walk  . Sulfa Antibiotics Swelling    Patient Measurements: Weight: 199 lb (90.3 kg)  Vital Signs: BP: 109/73 (10/13 1400) Pulse Rate: 61 (10/13 1400) Intake/Output from previous day: No intake/output data recorded. Intake/Output from this shift: No intake/output data recorded.  Labs:  Recent Labs  Aug 18, 2017 1323  WBC 8.9  HGB 13.1  HCT 40.1  PLT 181  CREATININE 1.42*  ALBUMIN 3.3*  PROT 6.8  AST 38  ALT 20  ALKPHOS 64  BILITOT 0.7   Estimated Creatinine Clearance: 42.2 mL/min (A) (by C-G formula based on SCr of 1.42 mg/dL (H)).   Microbiology: No results found for this or any previous visit (from the past 720 hour(s)).  Medical History: Past Medical History:  Diagnosis Date  . Allergy   . Asthma   . CHF (congestive heart failure) (HCC)    10/17 WATCHMAN FILTER IMPLANTED  . Chronic kidney disease    Kidney stones  . COPD (chronic obstructive pulmonary disease) (HCC)   . Dysrhythmia    WATCHMAN FILTER 10/17  . Glaucoma (increased eye  pressure)   . Hearing loss   . History of shingles 4 plus years ago   set in left eye  . Hypertension   . OSA on CPAP   . Persistent atrial fibrillation (HCC) 06/05/2016  . Presence of permanent cardiac pacemaker    2010  . Renal insufficiency   . Sebaceous cyst    Upper back  . Shortness of breath dyspnea    with exertion  . Sinus bradycardia    s/p MDT PPM   . Traumatic brain injury (HCC) 01/2005  . Ventricular tachycardia (HCC)    05/2016 requiring DCCV - started on amiodarone     Medications:   (Not in a hospital admission) Scheduled:   Infusions:  . prothrombin complex conc human (Kcentra) IVPB     PRN:  Anti-infectives    None      Assessment: 50units/kg of KCentra ordered by Dr Shaune Pollack due to uncontrolled life-threatening bleed with head trauma while patient anticoagulated with apixaban. Dose changed to 4779 units by pharmacist to accurately reflect closest factor IX dose available in whole vials, within 10% of target dose.  Goal of Therapy:   Stop uncontrolled life threatening bleeding.   Plan:  RN to monitor for signs of clotting, RN Katie instructed to infuse at rate on bag and immediate expiration, needs to be used now. Spoke with her in ED regarding administration. Post KCentra monitoring after apixban does not include lab work beyond CBC for continued bleeding and clinical monitoring.  Luan Pulling, PharmD, MBA, [employer] Clinical Pharmacist [facility]    08-22-2017,2:49 PM

## 2017-07-25 NOTE — ED Notes (Signed)
EMTALA reviewed by charge RN 

## 2017-07-27 ENCOUNTER — Telehealth: Payer: Self-pay | Admitting: Internal Medicine

## 2017-08-07 NOTE — Telephone Encounter (Signed)
Spoke with patient's wife  Gypsy Balsam, NP 08/07/2017 3:16 PM

## 2017-08-13 NOTE — Telephone Encounter (Signed)
°  Lori(Dr.[employer] ) Is calling to see if Mr. Dinapoli needs to be off of his blood thinner to have his teeth cleaned . Please call

## 2017-08-13 NOTE — Telephone Encounter (Signed)
Follow up     Pt fell this weekend and it caused skull fracture, patient has passed away, daughter would like you to inform Amber, herla father was really close with her.

## 2017-08-13 DEATH — deceased

## 2017-08-17 MED FILL — Medication: Qty: 1 | Status: AC

## 2017-09-10 NOTE — Progress Notes (Signed)
Created encounter and queried [facility] for [employer].
